# Patient Record
Sex: Female | Born: 1975 | Race: White | Hispanic: No | Marital: Married | Smoking: Never smoker
Health system: Southern US, Community
[De-identification: ages and names within clinical notes are randomized; demographics above are authoritative.]

## PROBLEM LIST (undated history)

## (undated) DIAGNOSIS — T7840XA Allergy, unspecified, initial encounter: Secondary | ICD-10-CM

## (undated) DIAGNOSIS — F32A Depression, unspecified: Secondary | ICD-10-CM

## (undated) DIAGNOSIS — M1990 Unspecified osteoarthritis, unspecified site: Secondary | ICD-10-CM

## (undated) DIAGNOSIS — F419 Anxiety disorder, unspecified: Secondary | ICD-10-CM

## (undated) DIAGNOSIS — F329 Major depressive disorder, single episode, unspecified: Secondary | ICD-10-CM

## (undated) DIAGNOSIS — K219 Gastro-esophageal reflux disease without esophagitis: Secondary | ICD-10-CM

## (undated) HISTORY — DX: Major depressive disorder, single episode, unspecified: F32.9

## (undated) HISTORY — DX: Depression, unspecified: F32.A

## (undated) HISTORY — DX: Allergy, unspecified, initial encounter: T78.40XA

## (undated) HISTORY — DX: Gastro-esophageal reflux disease without esophagitis: K21.9

---

## 1998-10-13 ENCOUNTER — Other Ambulatory Visit: Admission: RE | Admit: 1998-10-13 | Discharge: 1998-10-13 | Payer: Self-pay | Admitting: Family Medicine

## 2000-02-09 ENCOUNTER — Other Ambulatory Visit: Admission: RE | Admit: 2000-02-09 | Discharge: 2000-02-09 | Payer: Self-pay | Admitting: Family Medicine

## 2001-03-04 ENCOUNTER — Other Ambulatory Visit: Admission: RE | Admit: 2001-03-04 | Discharge: 2001-03-04 | Payer: Self-pay | Admitting: Family Medicine

## 2007-10-15 ENCOUNTER — Encounter: Admission: RE | Admit: 2007-10-15 | Discharge: 2007-10-15 | Payer: Self-pay | Admitting: Family Medicine

## 2008-10-15 ENCOUNTER — Encounter: Admission: RE | Admit: 2008-10-15 | Discharge: 2008-10-15 | Payer: Self-pay

## 2013-02-13 ENCOUNTER — Encounter: Payer: Self-pay | Admitting: *Deleted

## 2013-02-13 NOTE — Telephone Encounter (Signed)
This encounter was created in error - please disregard.

## 2013-03-16 ENCOUNTER — Ambulatory Visit (INDEPENDENT_AMBULATORY_CARE_PROVIDER_SITE_OTHER): Payer: BC Managed Care – PPO | Admitting: Nurse Practitioner

## 2013-03-16 ENCOUNTER — Encounter: Payer: Self-pay | Admitting: Nurse Practitioner

## 2013-03-16 VITALS — BP 125/73 | HR 104 | Temp 98.6°F | Ht 65.0 in | Wt 160.0 lb

## 2013-03-16 DIAGNOSIS — J309 Allergic rhinitis, unspecified: Secondary | ICD-10-CM | POA: Insufficient documentation

## 2013-03-16 DIAGNOSIS — K219 Gastro-esophageal reflux disease without esophagitis: Secondary | ICD-10-CM | POA: Insufficient documentation

## 2013-03-16 DIAGNOSIS — F329 Major depressive disorder, single episode, unspecified: Secondary | ICD-10-CM

## 2013-03-16 MED ORDER — BUSPIRONE HCL 15 MG PO TABS: 15.0000 mg | ORAL_TABLET | Freq: Every day | ORAL | Status: DC

## 2013-03-16 MED ORDER — ESCITALOPRAM OXALATE 20 MG PO TABS: 20.0000 mg | ORAL_TABLET | Freq: Every day | ORAL | Status: DC

## 2013-03-16 NOTE — Patient Instructions (Signed)
Stress Stress-related medical problems are becoming increasingly common. The body has a built-in physical response to stressful situations. Faced with pressure, challenge or danger, we need to react quickly. Our bodies release hormones such as cortisol and adrenaline to help do this. These hormones are part of the "fight or flight" response and affect the metabolic rate, heart rate and blood pressure, resulting in a heightened, stressed state that prepares the body for optimum performance in dealing with a stressful situation. It is likely that early man required these mechanisms to stay alive, but usually modern stresses do not call for this, and the same hormones released in today's world can damage health and reduce coping ability. CAUSES  Pressure to perform at work, at school or in sports.  Threats of physical violence.  Money worries.  Arguments.  Family conflicts.  Divorce or separation from significant other.  Bereavement.  New job or unemployment.  Changes in location.  Alcohol or drug abuse. SOMETIMES, THERE IS NO PARTICULAR REASON FOR DEVELOPING STRESS. Almost all people are at risk of being stressed at some time in their lives. It is important to know that some stress is temporary and some is long term.  Temporary stress will go away when a situation is resolved. Most people can cope with short periods of stress, and it can often be relieved by relaxing, taking a walk, chatting through issues with friends, or having a good night's sleep.  Chronic (long-term, continuous) stress is much harder to deal with. It can be psychologically and emotionally damaging. It can be harmful both for an individual and for friends and family. SYMPTOMS Everyone reacts to stress differently. There are some common effects that help us recognize it. In times of extreme stress, people may:  Shake uncontrollably.  Breathe faster and deeper than normal (hyperventilate).  Vomit.  For people  with asthma, stress can trigger an attack.  For some people, stress may trigger migraine headaches, ulcers, and body pain. PHYSICAL EFFECTS OF STRESS MAY INCLUDE:  Loss of energy.  Skin problems.  Aches and pains resulting from tense muscles, including neck ache, backache and tension headaches.  Increased pain from arthritis and other conditions.  Irregular heart beat (palpitations).  Periods of irritability or anger.  Apathy or depression.  Anxiety (feeling uptight or worrying).  Unusual behavior.  Loss of appetite.  Comfort eating.  Lack of concentration.  Loss of, or decreased, sex-drive.  Increased smoking, drinking, or recreational drug use.  For women, missed periods.  Ulcers, joint pain, and muscle pain. Post-traumatic stress is the stress caused by any serious accident, strong emotional damage, or extremely difficult or violent experience such as rape or war. Post-traumatic stress victims can experience mixtures of emotions such as fear, shame, depression, guilt or anger. It may include recurrent memories or images that may be haunting. These feelings can last for weeks, months or even years after the traumatic event that triggered them. Specialized treatment, possibly with medicines and psychological therapies, is available. If stress is causing physical symptoms, severe distress or making it difficult for you to function as normal, it is worth seeing your caregiver. It is important to remember that although stress is a usual part of life, extreme or prolonged stress can lead to other illnesses that will need treatment. It is better to visit a doctor sooner rather than later. Stress has been linked to the development of high blood pressure and heart disease, as well as insomnia and depression. There is no diagnostic test for   stress since everyone reacts to it differently. But a caregiver will be able to spot the physical symptoms, such  as:  Headaches.  Shingles.  Ulcers. Emotional distress such as intense worry, low mood or irritability should be detected when the doctor asks pertinent questions to identify any underlying problems that might be the cause. In case there are physical reasons for the symptoms, the doctor may also want to do some tests to exclude certain conditions. If you feel that you are suffering from stress, try to identify the aspects of your life that are causing it. Sometimes you may not be able to change or avoid them, but even a small change can have a positive ripple effect. A simple lifestyle change can make all the difference. STRATEGIES THAT CAN HELP DEAL WITH STRESS:  Delegating or sharing responsibilities.  Avoiding confrontations.  Learning to be more assertive.  Regular exercise.  Avoid using alcohol or street drugs to cope.  Eating a healthy, balanced diet, rich in fruit and vegetables and proteins.  Finding humor or absurdity in stressful situations.  Never taking on more than you know you can handle comfortably.  Organizing your time better to get as much done as possible.  Talking to friends or family and sharing your thoughts and fears.  Listening to music or relaxation tapes.  Tensing and then relaxing your muscles, starting at the toes and working up to the head and neck. If you think that you would benefit from help, either in identifying the things that are causing your stress or in learning techniques to help you relax, see a caregiver who is capable of helping you with this. Rather than relying on medications, it is usually better to try and identify the things in your life that are causing stress and try to deal with them. There are many techniques of managing stress including counseling, psychotherapy, aromatherapy, yoga, and exercise. Your caregiver can help you determine what is best for you. Document Released: 09/29/2002 Document Revised: 10/01/2011 Document  Reviewed: 08/26/2007 [employer] Patient Information 2014 [employer].  

## 2013-03-16 NOTE — Progress Notes (Signed)
  Subjective:    Patient ID: Alejandra Mcmahon, female    DOB: 08/24/1975, 37 y.o.   MRN: [id number]  HPI Patient in for follow up of depression- she is currently on buspar and lexapro- The combination is working well for her- helps to keep her calm and focused. Would like to remain on combination.    Review of Systems  All other systems reviewed and are negative.       Objective:   Physical Exam  Constitutional: She is oriented to person, place, and time. She appears well-developed and well-nourished.  Cardiovascular: Normal rate, regular rhythm and normal heart sounds.   Pulmonary/Chest: Effort normal and breath sounds normal.  Neurological: She is alert and oriented to person, place, and time. No cranial nerve deficit.  Psychiatric: She has a normal mood and affect. Her behavior is normal. Judgment and thought content normal.    BP 125/73  Pulse 104  Temp(Src) 98.6 F (37 C) (Oral)  Ht 5\' 5"  (1.651 m)  Wt 160 lb (72.576 kg)  BMI 26.63 kg/m2  LMP 03/12/2013       Assessment & Plan:  1. Depression Stress management Exercise encouraged - escitalopram (LEXAPRO) 20 MG tablet; Take 1 tablet (20 mg total) by mouth daily.  Dispense: 30 tablet; Refill: 5 - busPIRone (BUSPAR) 15 MG tablet; Take 1 tablet (15 mg total) by mouth daily.  Dispense: 30 tablet; Refill: 5  Mary-Margaret Daphine Deutscher, FNP

## 2013-06-09 ENCOUNTER — Other Ambulatory Visit: Payer: Self-pay | Admitting: *Deleted

## 2013-06-09 MED ORDER — DROSPIREN-ETH ESTRAD-LEVOMEFOL 3-0.03-0.451 MG PO TABS: 1.0000 | ORAL_TABLET | Freq: Every day | ORAL | Status: DC

## 2013-07-20 ENCOUNTER — Encounter: Payer: Self-pay | Admitting: Family Medicine

## 2013-07-20 ENCOUNTER — Ambulatory Visit (INDEPENDENT_AMBULATORY_CARE_PROVIDER_SITE_OTHER): Payer: BC Managed Care – PPO | Admitting: Family Medicine

## 2013-07-20 ENCOUNTER — Telehealth: Payer: Self-pay | Admitting: Nurse Practitioner

## 2013-07-20 ENCOUNTER — Encounter (INDEPENDENT_AMBULATORY_CARE_PROVIDER_SITE_OTHER): Payer: Self-pay

## 2013-07-20 VITALS — BP 111/74 | HR 109 | Temp 97.0°F | Ht 64.0 in | Wt 164.4 lb

## 2013-07-20 DIAGNOSIS — R059 Cough, unspecified: Secondary | ICD-10-CM

## 2013-07-20 DIAGNOSIS — J069 Acute upper respiratory infection, unspecified: Secondary | ICD-10-CM

## 2013-07-20 DIAGNOSIS — R05 Cough: Secondary | ICD-10-CM

## 2013-07-20 LAB — POCT INFLUENZA A/B
Influenza A, POC: NEGATIVE
Influenza B, POC: NEGATIVE

## 2013-07-20 MED ORDER — AZITHROMYCIN 250 MG PO TABS: ORAL_TABLET | ORAL | Status: DC

## 2013-07-20 MED ORDER — BENZONATATE 100 MG PO CAPS: 100.0000 mg | ORAL_CAPSULE | Freq: Three times a day (TID) | ORAL | Status: DC | PRN

## 2013-07-20 NOTE — Progress Notes (Signed)
   Subjective:    Patient ID: Shanigua Gibb, female    DOB: 1975/11/09, 37 y.o.   MRN: [id number]  HPI This 37 y.o. female presents for evaluation of URI sx's for over a week.   Review of Systems No chest pain, SOB, HA, dizziness, vision change, N/V, diarrhea, constipation, dysuria, urinary urgency or frequency, myalgias, arthralgias or rash.     Objective:   Physical Exam Vital signs noted  Well developed well nourished female.  HEENT - Head atraumatic Normocephalic                Eyes - PERRLA, Conjuctiva - clear Sclera- Clear EOMI                Ears - EAC's Wnl TM's Wnl Gross Hearing WNL                Nose - Nares patent                 Throat - oropharanx wnl Respiratory - Lungs CTA bilateral Cardiac - RRR S1 and S2 without murmur GI - Abdomen soft Nontender and bowel sounds active x 4 Extremities - No edema. Neuro - Grossly intact.       Assessment & Plan:  Cough - Plan: Influenza A/B, azithromycin (ZITHROMAX) 250 MG tablet, benzonatate (TESSALON PERLES) 100 MG capsule  URI (upper respiratory infection) - Plan: azithromycin (ZITHROMAX) 250 MG tablet, benzonatate (TESSALON PERLES) 100 MG capsule Push po fluids, rest, tylenol and motrin otc prn as directed for fever, arthralgias, and myalgias.  Follow up prn if sx's continue or persist.  Deatra Canter FNP

## 2013-07-20 NOTE — Telephone Encounter (Signed)
appt today at 1:00 with bill oxford

## 2013-08-06 ENCOUNTER — Other Ambulatory Visit: Payer: Self-pay | Admitting: Nurse Practitioner

## 2013-08-12 ENCOUNTER — Ambulatory Visit (INDEPENDENT_AMBULATORY_CARE_PROVIDER_SITE_OTHER): Payer: BC Managed Care – PPO | Admitting: General Practice

## 2013-08-12 ENCOUNTER — Encounter: Payer: Self-pay | Admitting: General Practice

## 2013-08-12 VITALS — BP 114/79 | HR 111 | Temp 97.3°F | Ht 64.0 in | Wt 165.0 lb

## 2013-08-12 DIAGNOSIS — H6120 Impacted cerumen, unspecified ear: Secondary | ICD-10-CM

## 2013-08-12 DIAGNOSIS — J329 Chronic sinusitis, unspecified: Secondary | ICD-10-CM

## 2013-08-12 DIAGNOSIS — H6123 Impacted cerumen, bilateral: Secondary | ICD-10-CM

## 2013-08-12 DIAGNOSIS — J029 Acute pharyngitis, unspecified: Secondary | ICD-10-CM

## 2013-08-12 LAB — POCT INFLUENZA A/B
INFLUENZA B, POC: NEGATIVE
Influenza A, POC: NEGATIVE

## 2013-08-12 LAB — POCT RAPID STREP A (OFFICE): RAPID STREP A SCREEN: NEGATIVE

## 2013-08-12 MED ORDER — CLARITHROMYCIN 250 MG PO TABS: 250.0000 mg | ORAL_TABLET | Freq: Two times a day (BID) | ORAL | Status: DC

## 2013-08-12 MED ORDER — AZITHROMYCIN 250 MG PO TABS: ORAL_TABLET | ORAL | Status: DC

## 2013-08-12 NOTE — Progress Notes (Signed)
Subjective:    Patient ID: Alejandra Mcmahon, female    DOB: 1976/05/20, 38 y.o.   MRN: [id number]  Cough This is a new problem. The current episode started in the past 7 days. The problem has been gradually worsening. The cough is productive of sputum. Associated symptoms include headaches, myalgias, nasal congestion, postnasal drip and a sore throat. Pertinent negatives include no chest pain, chills, fever, shortness of breath or wheezing. The symptoms are aggravated by lying down. Treatments tried: mucinex. Her past medical history is significant for bronchitis. There is no history of asthma or pneumonia.  Fever  Associated symptoms include congestion, headaches and a sore throat. Pertinent negatives include no chest pain, diarrhea, vomiting or wheezing.  Sore Throat  This is a new problem. The current episode started in the past 7 days. The problem has been unchanged. Neither side of throat is experiencing more pain than the other. Maximum temperature: 99.6 temp. The pain is at a severity of 3/10. Associated symptoms include congestion and headaches. Pertinent negatives include no diarrhea, shortness of breath or vomiting. She has had no exposure to strep or mono. She has tried acetaminophen for the symptoms. The treatment provided moderate relief.      Review of Systems  Constitutional: Negative for fever and chills.  HENT: Positive for congestion, postnasal drip and sore throat.   Respiratory: Negative for chest tightness, shortness of breath and wheezing.   Cardiovascular: Negative for chest pain and palpitations.  Gastrointestinal: Negative for vomiting and diarrhea.  Musculoskeletal: Positive for myalgias.  Neurological: Positive for headaches.  All other systems reviewed and are negative.       Objective:   Physical Exam  Constitutional: She is oriented to person, place, and time. She appears well-developed and well-nourished.  HENT:  Head: Normocephalic and atraumatic.    Mouth/Throat: Oropharynx is clear and moist.  Cerumen impaction to bilateral ears, unable to visualize at this time  Eyes: EOM are normal. Pupils are equal, round, and reactive to light.  Neck: Normal range of motion. Neck supple. No thyromegaly present.  Cardiovascular: Normal rate, regular rhythm and normal heart sounds.   Pulmonary/Chest: Effort normal and breath sounds normal. No respiratory distress. She exhibits no tenderness.  Neurological: She is alert and oriented to person, place, and time.  Skin: Skin is dry.  Psychiatric: She has a normal mood and affect.     Results for orders placed in visit on 08/12/13  POCT INFLUENZA A/B      Result Value Range   Influenza A, POC Negative     Influenza B, POC Negative    POCT RAPID STREP A (OFFICE)      Result Value Range   Rapid Strep A Screen Negative  Negative        Assessment & Plan:  1. Sore throat  - POCT Influenza A/B - POCT rapid strep A  2. Impacted cerumen of both ears -cerumen partially removed with cerumen spoon -bilateral ear lavage to remove remainder  -TM pearly gray bilaterally after lavage, negative erythema 3. Sinusitis  - clarithromycin (BIAXIN) 250 MG tablet; Take 1 tablet (250 mg total) by mouth 2 (two) times daily.  Dispense: 20 tablet; Refill: 0 -adequate fluids -Rest -RTO if symptoms worsen or unresolved -Patient verbalized understanding Coralie Keens, FNP-C

## 2013-08-12 NOTE — Patient Instructions (Signed)

## 2013-08-14 ENCOUNTER — Other Ambulatory Visit: Payer: Self-pay | Admitting: Nurse Practitioner

## 2013-09-03 ENCOUNTER — Other Ambulatory Visit: Payer: Self-pay | Admitting: Nurse Practitioner

## 2013-09-04 NOTE — Telephone Encounter (Signed)
Patient notified at last refill on 08-06-13 that NTBS. Was seen on 08-12-13 but only for an acute visit. Please advise on refills

## 2013-09-09 ENCOUNTER — Other Ambulatory Visit: Payer: Self-pay | Admitting: Nurse Practitioner

## 2013-09-27 ENCOUNTER — Other Ambulatory Visit: Payer: Self-pay | Admitting: Family Medicine

## 2013-09-28 NOTE — Telephone Encounter (Signed)
Last seen 08/12/13  Mae

## 2013-09-30 ENCOUNTER — Other Ambulatory Visit: Payer: Self-pay | Admitting: Nurse Practitioner

## 2013-10-02 ENCOUNTER — Other Ambulatory Visit: Payer: Self-pay | Admitting: Nurse Practitioner

## 2013-10-09 ENCOUNTER — Other Ambulatory Visit: Payer: Self-pay | Admitting: *Deleted

## 2013-10-09 MED ORDER — BUSPIRONE HCL 15 MG PO TABS: ORAL_TABLET | ORAL | Status: DC

## 2013-11-09 ENCOUNTER — Other Ambulatory Visit: Payer: BC Managed Care – PPO | Admitting: Nurse Practitioner

## 2013-11-10 ENCOUNTER — Telehealth: Payer: Self-pay | Admitting: Nurse Practitioner

## 2013-11-10 MED ORDER — ESCITALOPRAM OXALATE 20 MG PO TABS: ORAL_TABLET | ORAL | Status: DC

## 2013-11-10 MED ORDER — BUSPIRONE HCL 15 MG PO TABS: ORAL_TABLET | ORAL | Status: DC

## 2013-11-10 NOTE — Telephone Encounter (Signed)
rx sent to pharmacy

## 2013-11-17 ENCOUNTER — Other Ambulatory Visit: Payer: Self-pay | Admitting: Nurse Practitioner

## 2013-11-18 NOTE — Telephone Encounter (Signed)
Last ov 1/15. No pap in epic. ntbs

## 2013-11-19 ENCOUNTER — Encounter: Payer: Self-pay | Admitting: *Deleted

## 2013-12-10 ENCOUNTER — Other Ambulatory Visit: Payer: Self-pay | Admitting: Nurse Practitioner

## 2013-12-11 NOTE — Telephone Encounter (Signed)
Last seen 08/12/13 Mae  No PAP in [facility]

## 2013-12-16 ENCOUNTER — Other Ambulatory Visit: Payer: Self-pay | Admitting: General Practice

## 2013-12-17 ENCOUNTER — Ambulatory Visit (INDEPENDENT_AMBULATORY_CARE_PROVIDER_SITE_OTHER): Payer: BC Managed Care – PPO | Admitting: Nurse Practitioner

## 2013-12-17 ENCOUNTER — Encounter: Payer: Self-pay | Admitting: Nurse Practitioner

## 2013-12-17 VITALS — BP 132/79 | HR 65 | Temp 98.3°F | Ht 64.0 in | Wt 165.0 lb

## 2013-12-17 DIAGNOSIS — Z01419 Encounter for gynecological examination (general) (routine) without abnormal findings: Secondary | ICD-10-CM

## 2013-12-17 DIAGNOSIS — F32A Depression, unspecified: Secondary | ICD-10-CM

## 2013-12-17 DIAGNOSIS — Z0000 Encounter for general adult medical examination without abnormal findings: Secondary | ICD-10-CM

## 2013-12-17 DIAGNOSIS — K219 Gastro-esophageal reflux disease without esophagitis: Secondary | ICD-10-CM

## 2013-12-17 DIAGNOSIS — Z124 Encounter for screening for malignant neoplasm of cervix: Secondary | ICD-10-CM

## 2013-12-17 DIAGNOSIS — F329 Major depressive disorder, single episode, unspecified: Secondary | ICD-10-CM

## 2013-12-17 LAB — POCT URINALYSIS DIPSTICK
BILIRUBIN UA: NEGATIVE
GLUCOSE UA: NEGATIVE
Ketones, UA: NEGATIVE
Leukocytes, UA: NEGATIVE
NITRITE UA: NEGATIVE
Protein, UA: NEGATIVE
RBC UA: NEGATIVE
Spec Grav, UA: <=1.005
UROBILINOGEN UA: NEGATIVE
pH, UA: 8

## 2013-12-17 LAB — POCT CBC
Granulocyte percent: 67 %G (ref 37–80)
HCT, POC: 41.3 % (ref 37.7–47.9)
Hemoglobin: 13.9 g/dL (ref 12.2–16.2)
LYMPH, POC: 2 (ref 0.6–3.4)
MCH: 29.8 pg (ref 27–31.2)
MCHC: 33.7 g/dL (ref 31.8–35.4)
MCV: 88.5 fL (ref 80–97)
MPV: 8.6 fL (ref 0–99.8)
PLATELET COUNT, POC: 223 10*3/uL (ref 142–424)
POC Granulocyte: 4.4 (ref 2–6.9)
POC LYMPH %: 30.7 % (ref 10–50)
RBC: 4.7 M/uL (ref 4.04–5.48)
RDW, POC: 11.6 %
WBC: 6.6 10*3/uL (ref 4.6–10.2)

## 2013-12-17 NOTE — Progress Notes (Signed)
Subjective:    Patient ID: Alejandra Mcmahon, female    DOB: 06-04-1976, 38 y.o.   MRN: [id number]  HPI Patient in today for CPE and PAP- SHe is doing well today without complaints.  Patient Active Problem List   Diagnosis Date Noted  . GERD (gastroesophageal reflux disease) 03/16/2013  . Depression 03/16/2013  . Allergic rhinitis 03/16/2013   Outpatient Encounter Prescriptions as of 12/17/2013  Medication Sig  . busPIRone (BUSPAR) 15 MG tablet TAKE 1 TABLET BY MOUTH DAILY  . escitalopram (LEXAPRO) 20 MG tablet TAKE 1 TABLET BY MOUTH DAILY  . SAFYRAL 3-0.03-0.451 MG tablet TAKE 1 TABLET BY MOUTH EVERY DAY. NEED TO BE SEEN  . cetirizine (ZYRTEC) 10 MG tablet Take 10 mg by mouth daily.  . fluticasone (FLONASE) 50 MCG/ACT nasal spray Place 2 sprays into the nose daily.  . [DISCONTINUED] clarithromycin (BIAXIN) 250 MG tablet Take 1 tablet (250 mg total) by mouth 2 (two) times daily.       Review of Systems  Constitutional: Negative.   Respiratory: Negative.   Cardiovascular: Negative.   Neurological: Positive for headaches (slight ).  Hematological: Negative.   Psychiatric/Behavioral: Negative.   All other systems reviewed and are negative.      Objective:   Physical Exam  Constitutional: She is oriented to person, place, and time. She appears well-developed and well-nourished.  HENT:  Head: Normocephalic.  Right Ear: Hearing, tympanic membrane, external ear and ear canal normal.  Left Ear: Hearing, tympanic membrane, external ear and ear canal normal.  Nose: Nose normal.  Mouth/Throat: Uvula is midline and oropharynx is clear and moist.  Eyes: Conjunctivae and EOM are normal. Pupils are equal, round, and reactive to light.  Neck: Normal range of motion and full passive range of motion without pain. Neck supple. No JVD present. Carotid bruit is not present. No mass and no thyromegaly present.  Cardiovascular: Normal rate, normal heart sounds and intact distal pulses.   No  murmur heard. Pulmonary/Chest: Effort normal and breath sounds normal. Right breast exhibits no inverted nipple, no mass, no nipple discharge, no skin change and no tenderness. Left breast exhibits no inverted nipple, no mass, no nipple discharge, no skin change and no tenderness.  Abdominal: Soft. Bowel sounds are normal. She exhibits no mass. There is no tenderness.  Genitourinary: Vagina normal and uterus normal. No breast swelling, tenderness, discharge or bleeding.  bimanual exam-No adnexal masses or tenderness. Cervix nonparous and pink- no discharge  Musculoskeletal: Normal range of motion.  Lymphadenopathy:    She has no cervical adenopathy.  Neurological: She is alert and oriented to person, place, and time.  Skin: Skin is warm and dry.  Psychiatric: She has a normal mood and affect. Her behavior is normal. Judgment and thought content normal.    BP 132/79  Pulse 65  Temp(Src) 98.3 F (36.8 C) (Oral)  Ht '5\' 4"'  (1.626 m)  Wt 165 lb (74.844 kg)  BMI 28.31 kg/m2  Results for orders placed in visit on 12/17/13  POCT URINALYSIS DIPSTICK      Result Value Ref Range   Color, UA yellow     Clarity, UA clear     Glucose, UA neg     Bilirubin, UA neg     Ketones, UA neg     Spec Grav, UA <=1.005     Blood, UA neg     pH, UA 8.0     Protein, UA neg     Urobilinogen, UA negative  Nitrite, UA neg     Leukocytes, UA Negative          Assessment & Plan:   1. Annual physical exam   2. GERD (gastroesophageal reflux disease)   3. Depression   4. Encounter for routine gynecological examination    Orders Placed This Encounter  Procedures  . CMP14+EGFR  . NMR, lipoprofile  . Thyroid Panel With TSH  . Vit D  25 hydroxy (rtn osteoporosis monitoring)  . POCT urinalysis dipstick  . POCT CBC    Labs pending Health maintenance reviewed Diet and exercise encouraged Continue all meds Follow up  In 1 year and prn   Mary-Margaret Hassell Done, FNP

## 2013-12-17 NOTE — Patient Instructions (Signed)

## 2013-12-18 ENCOUNTER — Other Ambulatory Visit: Payer: Self-pay | Admitting: Nurse Practitioner

## 2013-12-18 ENCOUNTER — Other Ambulatory Visit: Payer: BC Managed Care – PPO | Admitting: Nurse Practitioner

## 2013-12-18 LAB — CMP14+EGFR
ALK PHOS: 51 IU/L (ref 39–117)
ALT: 18 IU/L (ref 0–32)
AST: 19 IU/L (ref 0–40)
Albumin/Globulin Ratio: 1.8 (ref 1.1–2.5)
Albumin: 3.9 g/dL (ref 3.5–5.5)
BUN / CREAT RATIO: 14 (ref 8–20)
BUN: 10 mg/dL (ref 6–20)
CALCIUM: 9.4 mg/dL (ref 8.7–10.2)
CHLORIDE: 101 mmol/L (ref 97–108)
CO2: 23 mmol/L (ref 18–29)
Creatinine, Ser: 0.72 mg/dL (ref 0.57–1.00)
GFR calc Af Amer: 124 mL/min/{1.73_m2} (ref >59)
GFR calc non Af Amer: 107 mL/min/{1.73_m2} (ref >59)
GLOBULIN, TOTAL: 2.2 g/dL (ref 1.5–4.5)
Glucose: 88 mg/dL (ref 65–99)
POTASSIUM: 4.3 mmol/L (ref 3.5–5.2)
SODIUM: 137 mmol/L (ref 134–144)
Total Bilirubin: <0.2 mg/dL (ref 0.0–1.2)
Total Protein: 6.1 g/dL (ref 6.0–8.5)

## 2013-12-18 LAB — THYROID PANEL WITH TSH
FREE THYROXINE INDEX: 2.3 (ref 1.2–4.9)
T3 Uptake Ratio: 23 % — ABNORMAL LOW (ref 24–39)
T4 TOTAL: 10.1 ug/dL (ref 4.5–12.0)
TSH: 1.59 u[IU]/mL (ref 0.450–4.500)

## 2013-12-18 LAB — NMR, LIPOPROFILE
Cholesterol: 236 mg/dL — ABNORMAL HIGH (ref 100–199)
HDL Cholesterol by NMR: 70 mg/dL (ref >39)
HDL PARTICLE NUMBER: 51.6 umol/L (ref >=30.5)
LDL Particle Number: 1771 nmol/L — ABNORMAL HIGH (ref <1000)
LDL Size: 20.7 nm (ref >20.5)
LDLC SERPL CALC-MCNC: 125 mg/dL — AB (ref 0–99)
LP-IR SCORE: 39 (ref <=45)
Small LDL Particle Number: 658 nmol/L — ABNORMAL HIGH (ref <=527)
Triglycerides by NMR: 204 mg/dL — ABNORMAL HIGH (ref 0–149)

## 2013-12-18 LAB — VITAMIN D 25 HYDROXY (VIT D DEFICIENCY, FRACTURES): Vit D, 25-Hydroxy: 21.6 ng/mL — ABNORMAL LOW (ref 30.0–100.0)

## 2013-12-18 MED ORDER — ATORVASTATIN CALCIUM 40 MG PO TABS: 40.0000 mg | ORAL_TABLET | Freq: Every day | ORAL | Status: DC

## 2013-12-22 LAB — PAP IG W/ RFLX HPV ASCU: PAP Smear Comment: 0

## 2014-01-05 ENCOUNTER — Other Ambulatory Visit: Payer: Self-pay | Admitting: Nurse Practitioner

## 2014-01-12 ENCOUNTER — Encounter: Payer: Self-pay | Admitting: Family

## 2014-01-12 ENCOUNTER — Ambulatory Visit (INDEPENDENT_AMBULATORY_CARE_PROVIDER_SITE_OTHER): Payer: BC Managed Care – PPO | Admitting: Family

## 2014-01-12 VITALS — BP 118/77 | HR 95 | Temp 99.6°F | Ht 64.0 in | Wt 161.0 lb

## 2014-01-12 DIAGNOSIS — L259 Unspecified contact dermatitis, unspecified cause: Secondary | ICD-10-CM

## 2014-01-12 MED ORDER — HYDROCORTISONE 2.5 % EX CREA: TOPICAL_CREAM | Freq: Two times a day (BID) | CUTANEOUS | Status: DC

## 2014-01-12 MED ORDER — TRIAMCINOLONE ACETONIDE 0.5 % EX OINT: 1.0000 "application " | TOPICAL_OINTMENT | Freq: Two times a day (BID) | CUTANEOUS | Status: DC

## 2014-01-12 MED ORDER — METHYLPREDNISOLONE (PAK) 4 MG PO TABS: ORAL_TABLET | ORAL | Status: DC

## 2014-01-12 NOTE — Progress Notes (Signed)
   Subjective:    Patient ID: Fenton FoyHeather Hazell, female    DOB: 05-18-1976, 38 y.o.   MRN: [id number]  Rash This is a new problem. The current episode started 1 to 4 weeks ago (Two weeks ago). The problem has been gradually worsening since onset. The affected locations include the abdomen, right hip and right axilla. The rash is characterized by dryness, itchiness, redness and pain. She was exposed to nothing. Pertinent negatives include no cough, diarrhea, fever, joint pain or shortness of breath. Past treatments include antibiotic cream, antihistamine and anti-itch cream. The treatment provided mild relief. There is no history of allergies or asthma.      Review of Systems  Constitutional: Negative for fever.  Respiratory: Negative.  Negative for cough and shortness of breath.   Cardiovascular: Negative.   Gastrointestinal: Negative for diarrhea.  Genitourinary: Negative.   Musculoskeletal: Negative.  Negative for joint pain.  Skin: Positive for rash.  Hematological: Negative.   Psychiatric/Behavioral: Negative.   All other systems reviewed and are negative.      Objective:   Physical Exam  Vitals reviewed. Constitutional: She is oriented to person, place, and time. She appears well-developed and well-nourished. No distress.  Cardiovascular: Normal rate, regular rhythm, normal heart sounds and intact distal pulses.   No murmur heard. Pulmonary/Chest: Effort normal and breath sounds normal. No respiratory distress. She has no wheezes.  Abdominal: Soft. Bowel sounds are normal. She exhibits no distension. There is no tenderness.  Musculoskeletal: Normal range of motion. She exhibits no edema and no tenderness.  Neurological: She is alert and oriented to person, place, and time. She has normal reflexes. No cranial nerve deficit.  Skin: Skin is warm and dry. Rash noted. There is erythema.  Generalized Erythemas papular rash on lower abdomen area, right hip, and under right axillary    Psychiatric: She has a normal mood and affect. Her behavior is normal. Judgment and thought content normal.   BP 118/77  Pulse 95  Temp(Src) 99.6 F (37.6 C) (Oral)  Ht 5\' 4"  (1.626 m)  Wt 161 lb (73.029 kg)  BMI 27.62 kg/m2        Assessment & Plan:  1. Contact dermatitis -Do not scratch or pick at rash -Keep clean and dry - methylPREDNIsolone (MEDROL DOSPACK) 4 MG tablet; follow package directions  Dispense: 21 tablet; Refill: 0 - hydrocortisone 2.5 % cream; Apply topically 2 (two) times daily.  Dispense: 30 g; Refill: 0 - triamcinolone ointment (KENALOG) 0.5 %; Apply 1 application topically 2 (two) times daily.  Dispense: 30 g; Refill: 0 -RTO prn  Jannifer Rodneyhristy Hawks, FNP

## 2014-01-12 NOTE — Patient Instructions (Signed)

## 2014-01-20 ENCOUNTER — Other Ambulatory Visit: Payer: Self-pay | Admitting: Nurse Practitioner

## 2014-02-04 ENCOUNTER — Ambulatory Visit (INDEPENDENT_AMBULATORY_CARE_PROVIDER_SITE_OTHER): Payer: BC Managed Care – PPO | Admitting: Family Medicine

## 2014-02-04 VITALS — BP 109/73 | HR 92 | Temp 98.2°F | Wt 163.8 lb

## 2014-02-04 DIAGNOSIS — L259 Unspecified contact dermatitis, unspecified cause: Secondary | ICD-10-CM

## 2014-02-04 DIAGNOSIS — L309 Dermatitis, unspecified: Secondary | ICD-10-CM

## 2014-02-04 MED ORDER — HYDROXYZINE HCL 25 MG PO TABS: 25.0000 mg | ORAL_TABLET | Freq: Three times a day (TID) | ORAL | Status: DC | PRN

## 2014-02-04 MED ORDER — PREDNISONE 10 MG PO TABS: ORAL_TABLET | ORAL | Status: DC

## 2014-02-04 NOTE — Progress Notes (Signed)
   Subjective:    Patient ID: Alejandra FoyHeather Mcmahon, female    DOB: 1975-11-19, 38 y.o.   MRN: [id number]  HPI  This 38 y.o. female presents for evaluation of rash on lower abdomen.  She has had this rash for last few weeks.  She states it was almost gone but returned.  She was tx initially with cream and medrol dose pack.  Review of Systems C/o rash No chest pain, SOB, HA, dizziness, vision change, N/V, diarrhea, constipation, dysuria, urinary urgency or frequency, myalgias, arthralgias or rash.     Objective:   Physical Exam   Skin - Erythematous raised rash on sides and lower abdomen.  New areas of rash and various degrees of healing rash on abdomen    Assessment & Plan:  Dermatitis - Plan: hydrOXYzine (ATARAX/VISTARIL) 25 MG tablet, predniSONE (DELTASONE) 10 MG tablet  Deatra CanterWilliam J Oxford FNP

## 2014-02-15 ENCOUNTER — Other Ambulatory Visit: Payer: Self-pay | Admitting: Family Medicine

## 2014-02-15 ENCOUNTER — Telehealth: Payer: Self-pay | Admitting: Family Medicine

## 2014-02-15 DIAGNOSIS — R21 Rash and other nonspecific skin eruption: Secondary | ICD-10-CM

## 2014-02-15 NOTE — Telephone Encounter (Signed)
Referral to derm ordered.

## 2014-02-23 ENCOUNTER — Other Ambulatory Visit: Payer: Self-pay | Admitting: Family Medicine

## 2014-02-24 NOTE — Telephone Encounter (Signed)
Was given #30 on 02-04-14. Please advise on refill

## 2014-03-14 ENCOUNTER — Other Ambulatory Visit: Payer: Self-pay | Admitting: Family Medicine

## 2014-04-07 ENCOUNTER — Other Ambulatory Visit: Payer: Self-pay | Admitting: Family Medicine

## 2014-04-15 ENCOUNTER — Encounter: Payer: Self-pay | Admitting: Family Medicine

## 2014-04-15 ENCOUNTER — Ambulatory Visit (INDEPENDENT_AMBULATORY_CARE_PROVIDER_SITE_OTHER): Payer: BC Managed Care – PPO | Admitting: Family Medicine

## 2014-04-15 VITALS — BP 121/77 | HR 92 | Temp 97.8°F | Ht 64.0 in | Wt 175.8 lb

## 2014-04-15 DIAGNOSIS — J208 Acute bronchitis due to other specified organisms: Secondary | ICD-10-CM

## 2014-04-15 DIAGNOSIS — J209 Acute bronchitis, unspecified: Secondary | ICD-10-CM

## 2014-04-15 MED ORDER — AZITHROMYCIN 250 MG PO TABS: ORAL_TABLET | ORAL | Status: DC

## 2014-04-15 MED ORDER — METHYLPREDNISOLONE ACETATE 80 MG/ML IJ SUSP
80.0000 mg | Freq: Once | INTRAMUSCULAR | Status: AC
  Administered 2014-04-15: 80 mg via INTRAMUSCULAR

## 2014-04-15 MED ORDER — BENZONATATE 100 MG PO CAPS: 100.0000 mg | ORAL_CAPSULE | Freq: Three times a day (TID) | ORAL | Status: DC | PRN

## 2014-04-15 NOTE — Progress Notes (Signed)
   Subjective:    Patient ID: Alejandra Mcmahon, female    DOB: 07-17-1976, 38 y.o.   MRN: [id number]  HPI This 38 y.o. female presents for evaluation of URI sx's and sore throat and cough.   Review of Systems    No chest pain, SOB, HA, dizziness, vision change, N/V, diarrhea, constipation, dysuria, urinary urgency or frequency, myalgias, arthralgias or rash.  Objective:   Physical Exam  Vital signs noted  Well developed well nourished female.  HEENT - Head atraumatic Normocephalic                Eyes - PERRLA, Conjuctiva - clear Sclera- Clear EOMI                Ears - EAC's Wnl TM's Wnl Gross Hearing WNL                Nose - Nares patent                 Throat - oropharanx wnl Respiratory - Lungs CTA bilateral Cardiac - RRR S1 and S2 without murmur GI - Abdomen soft Nontender and bowel sounds active x 4 Extremities - No edema. Neuro - Grossly intact.      Assessment & Plan:  Acute bronchitis due to other specified organisms - Plan: methylPREDNISolone acetate (DEPO-MEDROL) injection 80 mg, benzonatate (TESSALON PERLES) 100 MG capsule, azithromycin (ZITHROMAX) 250 MG tablet  Deatra Canter FNP

## 2014-05-05 ENCOUNTER — Other Ambulatory Visit: Payer: Self-pay | Admitting: Nurse Practitioner

## 2014-05-26 ENCOUNTER — Other Ambulatory Visit: Payer: Self-pay | Admitting: Family Medicine

## 2014-06-20 ENCOUNTER — Other Ambulatory Visit: Payer: Self-pay | Admitting: Nurse Practitioner

## 2014-06-25 ENCOUNTER — Other Ambulatory Visit: Payer: Self-pay | Admitting: Nurse Practitioner

## 2014-07-08 ENCOUNTER — Other Ambulatory Visit: Payer: Self-pay | Admitting: Nurse Practitioner

## 2014-07-09 ENCOUNTER — Other Ambulatory Visit: Payer: Self-pay | Admitting: Nurse Practitioner

## 2014-07-09 MED ORDER — ESCITALOPRAM OXALATE 20 MG PO TABS: 20.0000 mg | ORAL_TABLET | Freq: Every day | ORAL | Status: DC

## 2014-07-15 ENCOUNTER — Other Ambulatory Visit: Payer: Self-pay | Admitting: Nurse Practitioner

## 2014-07-19 ENCOUNTER — Other Ambulatory Visit: Payer: Self-pay | Admitting: *Deleted

## 2014-07-19 MED ORDER — ESCITALOPRAM OXALATE 20 MG PO TABS: 20.0000 mg | ORAL_TABLET | Freq: Every day | ORAL | Status: DC

## 2014-07-19 NOTE — Telephone Encounter (Signed)
Lexapro script sent to [facility].  [facility] [place] [facility] [place].

## 2014-07-21 ENCOUNTER — Other Ambulatory Visit: Payer: Self-pay | Admitting: *Deleted

## 2014-07-21 MED ORDER — BUSPIRONE HCL 15 MG PO TABS: 15.0000 mg | ORAL_TABLET | Freq: Every day | ORAL | Status: DC

## 2014-08-04 ENCOUNTER — Other Ambulatory Visit: Payer: Self-pay | Admitting: Nurse Practitioner

## 2014-08-19 ENCOUNTER — Other Ambulatory Visit: Payer: Self-pay | Admitting: Nurse Practitioner

## 2014-09-09 ENCOUNTER — Ambulatory Visit: Payer: Self-pay | Admitting: Family

## 2014-09-16 ENCOUNTER — Other Ambulatory Visit: Payer: Self-pay | Admitting: Nurse Practitioner

## 2014-10-15 ENCOUNTER — Other Ambulatory Visit: Payer: Self-pay | Admitting: Nurse Practitioner

## 2014-12-12 ENCOUNTER — Other Ambulatory Visit: Payer: Self-pay | Admitting: Nurse Practitioner

## 2014-12-21 ENCOUNTER — Other Ambulatory Visit: Payer: Self-pay | Admitting: Nurse Practitioner

## 2014-12-21 NOTE — Telephone Encounter (Signed)
Last seen 04/15/14 B Oxford  No upcoming appt.

## 2015-01-12 ENCOUNTER — Other Ambulatory Visit: Payer: Self-pay | Admitting: Nurse Practitioner

## 2015-01-12 NOTE — Telephone Encounter (Signed)
04/15/14 B Oxford  This med not on EPIC list

## 2015-01-23 ENCOUNTER — Other Ambulatory Visit: Payer: Self-pay | Admitting: Family

## 2015-01-23 ENCOUNTER — Other Ambulatory Visit: Payer: Self-pay | Admitting: General Practice

## 2015-01-25 NOTE — Telephone Encounter (Signed)
Last seen 04/20/14 B Oxford

## 2015-01-26 ENCOUNTER — Other Ambulatory Visit: Payer: Self-pay | Admitting: Nurse Practitioner

## 2015-02-04 ENCOUNTER — Other Ambulatory Visit: Payer: Self-pay | Admitting: Nurse Practitioner

## 2015-03-24 ENCOUNTER — Other Ambulatory Visit: Payer: Self-pay | Admitting: Nurse Practitioner

## 2015-03-24 ENCOUNTER — Other Ambulatory Visit: Payer: Self-pay | Admitting: Family

## 2015-03-24 NOTE — Telephone Encounter (Signed)
no more refills without being seen  

## 2015-03-24 NOTE — Telephone Encounter (Signed)
Last seen 04/15/14  B OXford

## 2015-04-19 ENCOUNTER — Other Ambulatory Visit: Payer: Self-pay | Admitting: Nurse Practitioner

## 2015-04-23 ENCOUNTER — Other Ambulatory Visit: Payer: Self-pay | Admitting: Nurse Practitioner

## 2015-05-02 ENCOUNTER — Other Ambulatory Visit: Payer: Self-pay | Admitting: Nurse Practitioner

## 2015-05-09 ENCOUNTER — Ambulatory Visit (INDEPENDENT_AMBULATORY_CARE_PROVIDER_SITE_OTHER): Payer: BC Managed Care – PPO | Admitting: Family

## 2015-05-09 ENCOUNTER — Encounter: Payer: Self-pay | Admitting: Family

## 2015-05-09 VITALS — BP 126/88 | HR 118 | Temp 97.9°F | Ht 64.0 in | Wt 169.8 lb

## 2015-05-09 DIAGNOSIS — F411 Generalized anxiety disorder: Secondary | ICD-10-CM | POA: Insufficient documentation

## 2015-05-09 DIAGNOSIS — F329 Major depressive disorder, single episode, unspecified: Secondary | ICD-10-CM

## 2015-05-09 DIAGNOSIS — Z3041 Encounter for surveillance of contraceptive pills: Secondary | ICD-10-CM

## 2015-05-09 DIAGNOSIS — K219 Gastro-esophageal reflux disease without esophagitis: Secondary | ICD-10-CM

## 2015-05-09 DIAGNOSIS — J309 Allergic rhinitis, unspecified: Secondary | ICD-10-CM | POA: Diagnosis not present

## 2015-05-09 DIAGNOSIS — F32A Depression, unspecified: Secondary | ICD-10-CM

## 2015-05-09 MED ORDER — ESCITALOPRAM OXALATE 20 MG PO TABS: 20.0000 mg | ORAL_TABLET | Freq: Every day | ORAL | Status: DC

## 2015-05-09 MED ORDER — BUSPIRONE HCL 15 MG PO TABS: 15.0000 mg | ORAL_TABLET | Freq: Every day | ORAL | Status: DC

## 2015-05-09 MED ORDER — DROSPIREN-ETH ESTRAD-LEVOMEFOL 3-0.03-0.451 MG PO TABS: 1.0000 | ORAL_TABLET | Freq: Every day | ORAL | Status: DC

## 2015-05-09 NOTE — Patient Instructions (Signed)
Generalized Anxiety Disorder Generalized anxiety disorder (GAD) is a mental disorder. It interferes with life functions, including relationships, work, and school. GAD is different from normal anxiety, which everyone experiences at some point in their lives in response to specific life events and activities. Normal anxiety actually helps us prepare for and get through these life events and activities. Normal anxiety goes away after the event or activity is over.  GAD causes anxiety that is not necessarily related to specific events or activities. It also causes excess anxiety in proportion to specific events or activities. The anxiety associated with GAD is also difficult to control. GAD can vary from mild to severe. People with severe GAD can have intense waves of anxiety with physical symptoms (panic attacks).  SYMPTOMS The anxiety and worry associated with GAD are difficult to control. This anxiety and worry are related to many life events and activities and also occur more days than not for 6 months or longer. People with GAD also have three or more of the following symptoms (one or more in children):  Restlessness.   Fatigue.  Difficulty concentrating.   Irritability.  Muscle tension.  Difficulty sleeping or unsatisfying sleep. DIAGNOSIS GAD is diagnosed through an assessment by your health care provider. Your health care provider will ask you questions aboutyour mood,physical symptoms, and events in your life. Your health care provider may ask you about your medical history and use of alcohol or drugs, including prescription medicines. Your health care provider may also do a physical exam and blood tests. Certain medical conditions and the use of certain substances can cause symptoms similar to those associated with GAD. Your health care provider may refer you to a mental health specialist for further evaluation. TREATMENT The following therapies are usually used to treat GAD:    Medication. Antidepressant medication usually is prescribed for long-term daily control. Antianxiety medicines may be added in severe cases, especially when panic attacks occur.   Talk therapy (psychotherapy). Certain types of talk therapy can be helpful in treating GAD by providing support, education, and guidance. A form of talk therapy called cognitive behavioral therapy can teach you healthy ways to think about and react to daily life events and activities.  Stress managementtechniques. These include yoga, meditation, and exercise and can be very helpful when they are practiced regularly. A mental health specialist can help determine which treatment is best for you. Some people see improvement with one therapy. However, other people require a combination of therapies.   This information is not intended to replace advice given to you by your health care provider. Make sure you discuss any questions you have with your health care provider.   Document Released: 11/03/2012 Document Revised: 07/30/2014 Document Reviewed: 11/03/2012 Elsevier Interactive Patient Education 2016 [employer] Health Maintenance, Female Adopting a healthy lifestyle and getting preventive care can go a long way to promote health and wellness. Talk with your health care provider about what schedule of regular examinations is right for you. This is a good chance for you to check in with your provider about disease prevention and staying healthy. In between checkups, there are plenty of things you can do on your own. Experts have done a lot of research about which lifestyle changes and preventive measures are most likely to keep you healthy. Ask your health care provider for more information. WEIGHT AND DIET  Eat a healthy diet  Be sure to include plenty of vegetables, fruits, low-fat dairy products, and lean protein.  Do not   eat a lot of foods high in solid fats, added sugars, or salt.  Get regular exercise. This  is one of the most important things you can do for your health.  Most adults should exercise for at least 150 minutes each week. The exercise should increase your heart rate and make you sweat (moderate-intensity exercise).  Most adults should also do strengthening exercises at least twice a week. This is in addition to the moderate-intensity exercise.  Maintain a healthy weight  Body mass index (BMI) is a measurement that can be used to identify possible weight problems. It estimates body fat based on height and weight. Your health care provider can help determine your BMI and help you achieve or maintain a healthy weight.  For females 38 years of age and older:   A BMI below 18.5 is considered underweight.  A BMI of 18.5 to 24.9 is normal.  A BMI of 25 to 29.9 is considered overweight.  A BMI of 30 and above is considered obese.  Watch levels of cholesterol and blood lipids  You should start having your blood tested for lipids and cholesterol at 38 years of age, then have this test every 5 years.  You may need to have your cholesterol levels checked more often if:  Your lipid or cholesterol levels are high.  You are older than 38 years of age.  You are at high risk for heart disease.  CANCER SCREENING   Lung Cancer  Lung cancer screening is recommended for adults 7-57 years old who are at high risk for lung cancer because of a history of smoking.  A yearly low-dose CT scan of the lungs is recommended for people who:  Currently smoke.  Have quit within the past 15 years.  Have at least a 30-pack-year history of smoking. A pack year is smoking an average of one pack of cigarettes a day for 1 year.  Yearly screening should continue until it has been 15 years since you quit.  Yearly screening should stop if you develop a health problem that would prevent you from having lung cancer treatment.  Breast Cancer  Practice breast self-awareness. This means understanding  how your breasts normally appear and feel.  It also means doing regular breast self-exams. Let your health care provider know about any changes, no matter how small.  If you are in your 20s or 30s, you should have a clinical breast exam (CBE) by a health care provider every 1-3 years as part of a regular health exam.  If you are 29 or older, have a CBE every year. Also consider having a breast X-ray (mammogram) every year.  If you have a family history of breast cancer, talk to your health care provider about genetic screening.  If you are at high risk for breast cancer, talk to your health care provider about having an MRI and a mammogram every year.  Breast cancer gene (BRCA) assessment is recommended for women who have family members with BRCA-related cancers. BRCA-related cancers include:  Breast.  Ovarian.  Tubal.  Peritoneal cancers.  Results of the assessment will determine the need for genetic counseling and BRCA1 and BRCA2 testing. Cervical Cancer Your health care provider may recommend that you be screened regularly for cancer of the pelvic organs (ovaries, uterus, and vagina). This screening involves a pelvic examination, including checking for microscopic changes to the surface of your cervix (Pap test). You may be encouraged to have this screening done every 3 years, beginning at  age 91.  For women ages 66-65, health care providers may recommend pelvic exams and Pap testing every 3 years, or they may recommend the Pap and pelvic exam, combined with testing for human papilloma virus (HPV), every 5 years. Some types of HPV increase your risk of cervical cancer. Testing for HPV may also be done on women of any age with unclear Pap test results.  Other health care providers may not recommend any screening for nonpregnant women who are considered low risk for pelvic cancer and who do not have symptoms. Ask your health care provider if a screening pelvic exam is right for  you.  If you have had past treatment for cervical cancer or a condition that could lead to cancer, you need Pap tests and screening for cancer for at least 20 years after your treatment. If Pap tests have been discontinued, your risk factors (such as having a new sexual partner) need to be reassessed to determine if screening should resume. Some women have medical problems that increase the chance of getting cervical cancer. In these cases, your health care provider may recommend more frequent screening and Pap tests. Colorectal Cancer  This type of cancer can be detected and often prevented.  Routine colorectal cancer screening usually begins at 38 years of age and continues through 38 years of age.  Your health care provider may recommend screening at an earlier age if you have risk factors for colon cancer.  Your health care provider may also recommend using home test kits to check for hidden blood in the stool.  A small camera at the end of a tube can be used to examine your colon directly (sigmoidoscopy or colonoscopy). This is done to check for the earliest forms of colorectal cancer.  Routine screening usually begins at age 46.  Direct examination of the colon should be repeated every 5-10 years through 38 years of age. However, you may need to be screened more often if early forms of precancerous polyps or small growths are found. Skin Cancer  Check your skin from head to toe regularly.  Tell your health care provider about any new moles or changes in moles, especially if there is a change in a mole's shape or color.  Also tell your health care provider if you have a mole that is larger than the size of a pencil eraser.  Always use sunscreen. Apply sunscreen liberally and repeatedly throughout the day.  Protect yourself by wearing long sleeves, pants, a wide-brimmed hat, and sunglasses whenever you are outside. HEART DISEASE, DIABETES, AND HIGH BLOOD PRESSURE   High blood  pressure causes heart disease and increases the risk of stroke. High blood pressure is more likely to develop in:  People who have blood pressure in the high end of the normal range (130-139/85-89 mm Hg).  People who are overweight or obese.  People who are African American.  If you are 10-97 years of age, have your blood pressure checked every 3-5 years. If you are 37 years of age or older, have your blood pressure checked every year. You should have your blood pressure measured twice--once when you are at a hospital or clinic, and once when you are not at a hospital or clinic. Record the average of the two measurements. To check your blood pressure when you are not at a hospital or clinic, you can use:  An automated blood pressure machine at a pharmacy.  A home blood pressure monitor.  If you are between 3  years and 45 years old, ask your health care provider if you should take aspirin to prevent strokes.  Have regular diabetes screenings. This involves taking a blood sample to check your fasting blood sugar level.  If you are at a normal weight and have a low risk for diabetes, have this test once every three years after 38 years of age.  If you are overweight and have a high risk for diabetes, consider being tested at a younger age or more often. PREVENTING INFECTION  Hepatitis B  If you have a higher risk for hepatitis B, you should be screened for this virus. You are considered at high risk for hepatitis B if:  You were born in a country where hepatitis B is common. Ask your health care provider which countries are considered high risk.  Your parents were born in a high-risk country, and you have not been immunized against hepatitis B (hepatitis B vaccine).  You have HIV or AIDS.  You use needles to inject street drugs.  You live with someone who has hepatitis B.  You have had sex with someone who has hepatitis B.  You get hemodialysis treatment.  You take certain  medicines for conditions, including cancer, organ transplantation, and autoimmune conditions. Hepatitis C  Blood testing is recommended for:  Everyone born from [birth year] through 1964.  Anyone with known risk factors for hepatitis C. Sexually transmitted infections (STIs)  You should be screened for sexually transmitted infections (STIs) including gonorrhea and chlamydia if:  You are sexually active and are younger than 38 years of age.  You are older than 38 years of age and your health care provider tells you that you are at risk for this type of infection.  Your sexual activity has changed since you were last screened and you are at an increased risk for chlamydia or gonorrhea. Ask your health care provider if you are at risk.  If you do not have HIV, but are at risk, it may be recommended that you take a prescription medicine daily to prevent HIV infection. This is called pre-exposure prophylaxis (PrEP). You are considered at risk if:  You are sexually active and do not regularly use condoms or know the HIV status of your partner(s).  You take drugs by injection.  You are sexually active with a partner who has HIV. Talk with your health care provider about whether you are at high risk of being infected with HIV. If you choose to begin PrEP, you should first be tested for HIV. You should then be tested every 3 months for as long as you are taking PrEP.  PREGNANCY   If you are premenopausal and you may become pregnant, ask your health care provider about preconception counseling.  If you may become pregnant, take 400 to 800 micrograms (mcg) of folic acid every day.  If you want to prevent pregnancy, talk to your health care provider about birth control (contraception). OSTEOPOROSIS AND MENOPAUSE   Osteoporosis is a disease in which the bones lose minerals and strength with aging. This can result in serious bone fractures. Your risk for osteoporosis can be identified using a bone  density scan.  If you are 2 years of age or older, or if you are at risk for osteoporosis and fractures, ask your health care provider if you should be screened.  Ask your health care provider whether you should take a calcium or vitamin D supplement to lower your risk for osteoporosis.  Menopause may have  certain physical symptoms and risks.  Hormone replacement therapy may reduce some of these symptoms and risks. Talk to your health care provider about whether hormone replacement therapy is right for you.  HOME CARE INSTRUCTIONS   Schedule regular health, dental, and eye exams.  Stay current with your immunizations.   Do not use any tobacco products including cigarettes, chewing tobacco, or electronic cigarettes.  If you are pregnant, do not drink alcohol.  If you are breastfeeding, limit how much and how often you drink alcohol.  Limit alcohol intake to no more than 1 drink per day for nonpregnant women. One drink equals 12 ounces of beer, 5 ounces of wine, or 1 ounces of hard liquor.  Do not use street drugs.  Do not share needles.  Ask your health care provider for help if you need support or information about quitting drugs.  Tell your health care provider if you often feel depressed.  Tell your health care provider if you have ever been abused or do not feel safe at home.   This information is not intended to replace advice given to you by your health care provider. Make sure you discuss any questions you have with your health care provider.   Document Released: 01/22/2011 Document Revised: 07/30/2014 Document Reviewed: 06/10/2013 Elsevier Interactive Patient Education [employer].

## 2015-05-09 NOTE — Progress Notes (Signed)
Subjective:    Patient ID: Alejandra Mcmahon, female    DOB: 04/24/1976, 39 y.o.   MRN: [id number]  Pt presents to the office today for chronic follow up for GAD and Depression.  Depression      The patient presents with depression.(Pt has been out lexapro for over a week)  This is a chronic problem.  The current episode started more than 1 year ago.   The onset quality is gradual.   The problem occurs intermittently.  The problem has been waxing and waning since onset.  Associated symptoms include insomnia, restlessness and sad.  Associated symptoms include no helplessness, no hopelessness, no headaches and no suicidal ideas.     The symptoms are aggravated by family issues.  Past treatments include SSRIs - Selective serotonin reuptake inhibitors.  Past medical history includes anxiety and depression.   Anxiety Presents for follow-up visit. Onset was 1 to 6 months ago. The problem has been waxing and waning. Symptoms include depressed mood, excessive worry, insomnia, irritability, nervous/anxious behavior and restlessness. Patient reports no palpitations, panic, shortness of breath or suicidal ideas. Primary symptoms comment: Pt has been out lexapro for over a week. Symptoms occur occasionally. The severity of symptoms is moderate. The symptoms are aggravated by family issues.   Her past medical history is significant for anxiety/panic attacks and depression. Past treatments include SSRIs and benzodiazephines. The treatment provided moderate relief. Compliance with prior treatments has been good.      Review of Systems  Constitutional: Positive for irritability.  HENT: Negative.   Eyes: Negative.   Respiratory: Negative.  Negative for shortness of breath.   Cardiovascular: Negative.  Negative for palpitations.  Gastrointestinal: Negative.   Endocrine: Negative.   Genitourinary: Negative.   Musculoskeletal: Negative.   Neurological: Negative.  Negative for headaches.  Hematological:  Negative.   Psychiatric/Behavioral: Positive for depression. Negative for suicidal ideas. The patient is nervous/anxious and has insomnia.   All other systems reviewed and are negative.      Objective:   Physical Exam  Constitutional: She is oriented to person, place, and time. She appears well-developed and well-nourished. No distress.  HENT:  Head: Normocephalic and atraumatic.  Right Ear: External ear normal.  Left Ear: External ear normal.  Nose: Nose normal.  Mouth/Throat: Oropharynx is clear and moist.  Eyes: Pupils are equal, round, and reactive to light.  Neck: Normal range of motion. Neck supple. No thyromegaly present.  Cardiovascular: Normal rate, regular rhythm, normal heart sounds and intact distal pulses.   No murmur heard. Pulmonary/Chest: Effort normal and breath sounds normal. No respiratory distress. She has no wheezes.  Abdominal: Soft. Bowel sounds are normal. She exhibits no distension. There is no tenderness.  Musculoskeletal: Normal range of motion. She exhibits no edema or tenderness.  Neurological: She is alert and oriented to person, place, and time. She has normal reflexes. No cranial nerve deficit.  Skin: Skin is warm and dry.  Psychiatric: She has a normal mood and affect. Her behavior is normal. Judgment and thought content normal.  Vitals reviewed.     BP 126/88 mmHg  Pulse 118  Temp(Src) 97.9 F (36.6 C) (Oral)  Ht '5\' 4"'  (1.626 m)  Wt 169 lb 12.8 oz (77.021 kg)  BMI 29.13 kg/m2     Assessment & Plan:  1. Gastroesophageal reflux disease, esophagitis presence not specified - CMP14+EGFR  2. Depression - CMP14+EGFR - busPIRone (BUSPAR) 15 MG tablet; Take 1 tablet (15 mg total) by mouth daily.  Dispense:  90 tablet; Refill: 3 - escitalopram (LEXAPRO) 20 MG tablet; Take 1 tablet (20 mg total) by mouth daily.  Dispense: 90 tablet; Refill: 3  3. Allergic rhinitis, unspecified allergic rhinitis type - CMP14+EGFR  4. GAD (generalized anxiety  disorder) -Stress management discussed - CMP14+EGFR - busPIRone (BUSPAR) 15 MG tablet; Take 1 tablet (15 mg total) by mouth daily.  Dispense: 90 tablet; Refill: 3 - escitalopram (LEXAPRO) 20 MG tablet; Take 1 tablet (20 mg total) by mouth daily.  Dispense: 90 tablet; Refill: 3  5. Encounter for surveillance of contraceptive pills - CMP14+EGFR - Drospirenone-Ethinyl Estradiol-Levomefol (SAFYRAL) 3-0.03-0.451 MG tablet; Take 1 tablet by mouth daily.  Dispense: 84 tablet; Refill: 3   Continue all meds Labs pending Health Maintenance reviewed Diet and exercise encouraged RTO 1 year  Evelina Dun, FNP

## 2015-05-10 LAB — CMP14+EGFR
A/G RATIO: 1.5 (ref 1.1–2.5)
ALBUMIN: 4 g/dL (ref 3.5–5.5)
ALT: 21 IU/L (ref 0–32)
AST: 22 IU/L (ref 0–40)
Alkaline Phosphatase: 62 IU/L (ref 39–117)
BUN / CREAT RATIO: 12 (ref 8–20)
BUN: 10 mg/dL (ref 6–20)
CALCIUM: 9.4 mg/dL (ref 8.7–10.2)
CHLORIDE: 99 mmol/L (ref 97–106)
CO2: 24 mmol/L (ref 18–29)
Creatinine, Ser: 0.81 mg/dL (ref 0.57–1.00)
GFR, EST AFRICAN AMERICAN: 107 mL/min/{1.73_m2} (ref >59)
GFR, EST NON AFRICAN AMERICAN: 92 mL/min/{1.73_m2} (ref >59)
GLOBULIN, TOTAL: 2.6 g/dL (ref 1.5–4.5)
Glucose: 112 mg/dL — ABNORMAL HIGH (ref 65–99)
POTASSIUM: 3.8 mmol/L (ref 3.5–5.2)
SODIUM: 140 mmol/L (ref 136–144)
TOTAL PROTEIN: 6.6 g/dL (ref 6.0–8.5)

## 2015-06-02 ENCOUNTER — Encounter: Payer: Self-pay | Admitting: Family

## 2015-06-02 ENCOUNTER — Ambulatory Visit (INDEPENDENT_AMBULATORY_CARE_PROVIDER_SITE_OTHER): Payer: BC Managed Care – PPO | Admitting: Family

## 2015-06-02 VITALS — BP 125/87 | HR 95 | Temp 97.6°F | Ht 64.0 in | Wt 173.8 lb

## 2015-06-02 DIAGNOSIS — S39012A Strain of muscle, fascia and tendon of lower back, initial encounter: Secondary | ICD-10-CM | POA: Diagnosis not present

## 2015-06-02 MED ORDER — KETOROLAC TROMETHAMINE 60 MG/2ML IM SOLN
60.0000 mg | Freq: Once | INTRAMUSCULAR | Status: AC
  Administered 2015-06-02: 60 mg via INTRAMUSCULAR

## 2015-06-02 MED ORDER — NAPROXEN 500 MG PO TABS: 500.0000 mg | ORAL_TABLET | Freq: Two times a day (BID) | ORAL | Status: DC

## 2015-06-02 MED ORDER — METHYLPREDNISOLONE ACETATE 80 MG/ML IJ SUSP
80.0000 mg | Freq: Once | INTRAMUSCULAR | Status: AC
  Administered 2015-06-02: 80 mg via INTRAMUSCULAR

## 2015-06-02 MED ORDER — CYCLOBENZAPRINE HCL 10 MG PO TABS: 10.0000 mg | ORAL_TABLET | Freq: Three times a day (TID) | ORAL | Status: DC | PRN

## 2015-06-02 NOTE — Patient Instructions (Signed)
Mid-Back Strain With Rehab   A strain is an injury in which a tendon or muscle is torn. The muscles and tendons of the mid-back are vulnerable to strains. However, these muscles and tendons are very strong and require a great force to be injured. The muscles of the mid-back are responsible for stabilizing the spinal column, as well as spinal twisting (rotation). Strains are classified into three categories. Grade 1 strains cause pain, but the tendon is not lengthened. Grade 2 strains include a lengthened ligament, due to the ligament being stretched or partially ruptured. With grade 2 strains there is still function, although the function may be decreased. Grade 3 strains involve a complete tear of the tendon or muscle, and function is usually impaired.  SYMPTOMS   · Pain in the middle of the back.  · Pain that may affect only one side, and is worse with movement.  · Muscle spasms, and often swelling in the back.  · Loss of strength of the back muscles.  · Crackling sound (crepitation) when the muscles are touched.  CAUSES   Mid-back strains occur when a force is placed on the muscles or tendons that is greater than they can handle. Common causes of injury include:  · Ongoing overuse of the muscle-tendon units in the middle back, usually from incorrect body posture.  · A single violent injury or force applied to the back.  RISK INCREASES WITH:  · Sports that involve twisting forces on the spine or a lot of bending at the waist (football, rugby, weightlifting, bowling, golf, tennis, speed skating, racquetball, swimming, running, gymnastics, diving).  · Poor strength and flexibility.  · Failure to warm up properly before activity.  · Family history of low back pain or disk disorders.  · Previous back injury or surgery (especially fusion).  PREVENTION  · Learn and use proper sports technique.  · Warm up and stretch properly before activity.  · Allow for adequate recovery between workouts.  · Maintain physical  fitness:    Strength, flexibility, and endurance.    Cardiovascular fitness.  PROGNOSIS   If treated properly, mid-back strains usually heal within 6 weeks.  RELATED COMPLICATIONS   · Frequently recurring symptoms, resulting in a chronic problem. Properly treating the problem the first time decreases frequency of recurrence.  · Chronic inflammation, scarring, and partial muscle-tendon tear.  · Delayed healing or resolution of symptoms, especially if activity is resumed too soon.  · Prolonged disability.  TREATMENT  Treatment first involves the use of ice and medicine, to reduce pain and inflammation. As the pain begins to subside, you may begin strengthening and stretching exercises to improve body posture and sport technique. These exercises may be performed at home or with a therapist. Severe injuries may require referral to a therapist for further evaluation and treatment, such as ultrasound. Corticosteroid injections may be given to help reduce inflammation. Biofeedback (watching monitors of your body processes) and psychotherapy may also be prescribed. Prolonged bed rest is felt to do more harm than good. Massage may help break the muscle spasms. Sometimes, an injection of cortisone, with or without local anesthetics, may be given to help relieve the pain and spasms.  MEDICATION   · If pain medicine is needed, nonsteroidal anti-inflammatory medicines (aspirin and ibuprofen), or other minor pain relievers (acetaminophen), are often advised.  · Do not take pain medicine for 7 days before surgery.  · Prescription pain relievers may be given, if your caregiver thinks they are needed. Use   only as directed and only as much as you need.  · Ointments applied to the skin may be helpful.  · Corticosteroid injections may be given by your caregiver. These injections should be reserved for the most serious cases, because they may only be given a certain number of times.  HEAT AND COLD:   · Cold treatment (icing) should be  applied for 10 to 15 minutes every 2 to 3 hours for inflammation and pain, and immediately after activity that aggravates your symptoms. Use ice packs or an ice massage.  · Heat treatment may be used before performing stretching and strengthening activities prescribed by your caregiver, physical therapist, or athletic trainer. Use a heat pack or a warm water soak.  SEEK IMMEDIATE MEDICAL CARE IF:  · Symptoms get worse or do not improve in 2 to 4 weeks, despite treatment.  · You develop numbness, weakness, or loss of bowel or bladder function.  · New, unexplained symptoms develop. (Drugs used in treatment may produce side effects.)  EXERCISES  RANGE OF MOTION (ROM) AND STRETCHING EXERCISES - Mid-Back Strain  These exercises may help you when beginning to rehabilitate your injury. In order to successfully resolve your symptoms, you must improve your posture. These exercises are designed to help reduce the forward-head and rounded-shoulder posture which contributes to this condition. Your symptoms may resolve with or without further involvement from your physician, physical therapist or athletic trainer. While completing these exercises, remember:   · Restoring tissue flexibility helps normal motion to return to the joints. This allows healthier, less painful movement and activity.  · An effective stretch should be held for at least 30 seconds.  · A stretch should never be painful. You should only feel a gentle lengthening or release in the stretched tissue.  STRETCH - Axial Extension  · Stand or sit on a firm surface. Assume a good posture: chest up, shoulders drawn back, stomach muscles slightly tense, knees unlocked (if standing) and feet hip width apart.  · Slowly retract your chin, so your head slides back and your chin slightly lowers. Continue to look straight ahead.  · You should feel a gentle stretch in the back of your head. Be certain not to feel an aggressive stretch since this can cause headaches  later.  · Hold for __________ seconds.  Repeat __________ times. Complete this exercise __________ times per day.  RANGE OF MOTION- Upper Thoracic Extension  · Sit on a firm chair with a high back. Assume a good posture: chest up, shoulders drawn back, abdominal muscles slightly tense, and feet hip width apart. Place a small pillow or folded towel in the curve of your lower back, if you are having difficulty maintaining good posture.  · Gently brace your neck with your hands, allowing your arms to rest on your chest.  · Continue to support your neck and slowly extend your back over the chair. You will feel a stretch across your upper back.  · Hold __________ seconds. Slowly return to the starting position.  Repeat __________ times. Complete this exercise __________ times per day.  RANGE OF MOTION- Mid-Thoracic Extension  · Roll a towel so that it is about 4 inches in diameter.  · Position the towel lengthwise. Lay on the towel so that your spine, but not your shoulder blades, are supported.  · You should feel your mid-back arching toward the floor. To increase the stretch, extend your arms away from your body.  · Hold for __________ seconds.    Repeat exercise __________ times, __________ times per day.  STRENGTHENING EXERCISES - Mid-Back Strain  These exercises may help you when beginning to rehabilitate your injury. They may resolve your symptoms with or without further involvement from your physician, physical therapist or athletic trainer. While completing these exercises, remember:   · Muscles can gain both the endurance and the strength needed for everyday activities through controlled exercises.  · Complete these exercises as instructed by your physician, physical therapist or athletic trainer. Increase the resistance and repetitions only as guided by your caregiver.  · You may experience muscle soreness or fatigue, but the pain or discomfort you are trying to eliminate should never worsen during these  exercises. If this pain does worsen, stop and make certain you are following the directions exactly. If the pain is still present after adjustments, discontinue the exercise until you can discuss the trouble with your caregiver.  STRENGTHENING - Quadruped, Opposite UE/LE Lift  · Assume a hands and knees position on a firm surface. Keep your hands under your shoulders and your knees under your hips. You may place padding under your knees for comfort.  · Find your neutral spine and gently tense your abdominal muscles so that you can maintain this position. Your shoulders and hips should form a rectangle that is parallel with the floor and is not twisted.  · Keeping your trunk steady, lift your right hand no higher than your shoulder and then your left leg no higher than your hip. Make sure you are not holding your breath. Hold this position __________ seconds.  · Continuing to keep your abdominal muscles tense and your back steady, slowly return to your starting position. Repeat with the opposite arm and leg.  Repeat __________ times. Complete this exercise __________ times per day.   STRENGTH - Shoulder Extensors  · Secure a rubber exercise band or tubing to a fixed object (table, pole) so that it is at the height of your shoulders when you are either standing, or sitting on a firm armless chair.  · With a thumbs-up grip, grasp an end of the band in each hand. Straighten your elbows and lift your hands straight in front of you at shoulder height. Step back away from the secured end of band, until it becomes tense.  · Squeezing your shoulder blades together, pull your hands down to the sides of your thighs. Do not allow your hands to go behind you.  · Hold for __________ seconds. Slowly ease the tension on the band, as you reverse the directions and return to the starting position.  Repeat __________ times. Complete this exercise __________ times per day.   STRENGTH - Horizontal Abductors  Choose one of the two  positions to complete this exercise.  Prone: lying on stomach:  · Lie on your stomach on a firm surface so that your right / left arm overhangs the edge. Rest your forehead on your opposite forearm. With your palm facing the floor and your elbow straight, hold a __________ weight in your hand.  · Squeeze your right / left shoulder blade to your mid-back spine and then slowly raise your arm to the height of the bed.  · Hold for __________ seconds. Slowly reverse the directions and return to the starting position, controlling the weight as you lower your arm.  Repeat __________ times. Complete this exercise __________ times per day.  Standing:   · Secure a rubber exercise band or tubing, so that it is at the height   of your shoulders when you are either standing, or sitting on a firm armless chair.  · Grasp an end of the band in each hand and have your palms face each other. Straighten your elbows and lift your hands straight in front of you at shoulder height. Step back away from the secured end of band, until it becomes tense.  · Squeeze your shoulder blades together. Keeping your elbows locked and your hands at shoulder height, spread your arms apart, forming a "T" shape with your body. Hold __________ seconds. Slowly ease the tension on the band, as you reverse the directions and return to the starting position.  Repeat __________ times. Complete this exercise __________ times per day.  STRENGTH - Scapular Retractors and External Rotators, Rowing  · Secure a rubber exercise band or tubing, so that it is at the height of your shoulders when you are either standing, or sitting on a firm armless chair.  · With a palm-down grip, grasp an end of the band in each hand. Straighten your elbows and lift your hands straight in front of you at shoulder height. Step back away from the secured end of band, until it becomes tense.  · Step 1: Squeeze your shoulder blades together. Bending your elbows, draw your hands to your  chest as if you are rowing a boat. At the end of this motion, your hands and elbow should be at shoulder height and your elbows should be out to your sides.  · Step 2: Rotate your shoulder to raise your hands above your head. Your forearms should be vertical and your upper arms should be horizontal.  · Hold for __________ seconds. Slowly ease the tension on the band, as you reverse the directions and return to the starting position.  Repeat __________ times. Complete this exercise __________ times per day.   POSTURE AND BODY MECHANICS CONSIDERATIONS - Mid-Back Strain  Keeping correct posture when sitting, standing or completing your activities will reduce the stress put on different body tissues, allowing injured tissues a chance to heal and limiting painful experiences. The following are general guidelines for improved posture. Your physician or physical therapist will provide you with any instructions specific to your needs. While reading these guidelines, remember:  · The exercises prescribed by your provider will help you have the flexibility and strength to maintain correct postures.  · The correct posture provides the best environment for your joints to work. All of your joints have less wear and tear when properly supported by a spine with good posture. This means you will experience a healthier, less painful body.  · Correct posture must be practiced with all of your activities, especially prolonged sitting and standing. Correct posture is as important when doing repetitive low-stress activities (typing) as it is when doing a single heavy-load activity (lifting).  PROPER SITTING POSTURE  In order to minimize stress and discomfort on your spine, you must sit with correct posture. Sitting with good posture should be effortless for a healthy body. Returning to good posture is a gradual process. Many people can work toward this most comfortably by using various supports until they have the flexibility and  strength to maintain this posture on their own.  When sitting with proper posture, your ears will fall over your shoulders and your shoulders will fall over your hips. You should use the back of the chair to support your upper back. Your lower back will be in a neutral position, just slightly arched. You may place   a small pillow or folded towel at the base of your low back for   support.   When working at a desk, create an environment that supports good, upright posture. Without extra support, muscles fatigue and lead to excessive strain on joints and other tissues. Keep these recommendations in mind:  CHAIR:  · A chair should be able to slide under your desk when your back makes contact with the back of the chair. This allows you to work closely.  · The chair's height should allow your eyes to be level with the upper part of your monitor and your hands to be slightly lower than your elbows.  BODY POSITION  · Your feet should make contact with the floor. If this is not possible, use a foot rest.  · Keep your ears over your shoulders. This will reduce stress on your neck and lower back.  INCORRECT SITTING POSTURES  If you are feeling tired and unable to assume a healthy sitting posture, do not slouch or slump. This puts excessive strain on your back tissues, causing more damage and pain. Healthier options include:  · Using more support, like a lumbar pillow.  · Switching tasks to something that requires you to be upright or walking.  · Talking a brief walk.  · Lying down to rest in a neutral-spine position.  CORRECT STANDING POSTURES  Proper standing posture should be assumed with all daily activities, even if they only take a few moments, like when brushing your teeth. As in sitting, your ears should fall over your shoulders and your shoulders should fall over your hips. You should keep a slight tension in your abdominal muscles to brace your spine. Your tailbone should point down to the ground, not behind your  body, resulting in an over-extended swayback posture.   INCORRECT STANDING POSTURES  Common incorrect standing postures include a forward head, locked knees, and an excessive swayback.  WALKING  Walk with an upright posture. Your ears, shoulders and hips should all line-up.  CORRECT LIFTING TECHNIQUES  DO :   · Assume a wide stance. This will provide you more stability and the opportunity to get as close as possible to the object which you are lifting.  · Tense your abdominals to brace your spine. Bend at the knees and hips. Keeping your back locked in a neutral-spine position, lift using your leg muscles. Lift with your legs, keeping your back straight.  · Test the weight of unknown objects before attempting to lift them.  · Try to keep your elbows locked down at your sides in order get the best strength from your shoulders when carrying an object.  · Always ask for help when lifting heavy or awkward objects.  INCORRECT LIFTING TECHNIQUES  DO NOT:   · Lock your knees when lifting, even if it is a small object.  · Bend and twist. Pivot at your feet or move your feet when needing to change directions.  · Assume that you can safely pick up even a paperclip without proper posture.     This information is not intended to replace advice given to you by your health care provider. Make sure you discuss any questions you have with your health care provider.     Document Released: 07/09/2005 Document Revised: 11/23/2014 Document Reviewed: 10/21/2008  [employer]vier Interactive Patient Education ©2016 [employer]

## 2015-06-02 NOTE — Progress Notes (Signed)
   Subjective:    Patient ID: Alejandra Mcmahon, female    DOB: Jul 01, 1976, 39 y.o.   MRN: [id number]  Back Pain This is a new problem. The current episode started more than 1 month ago. The problem occurs constantly. The problem has been waxing and waning since onset. The pain is present in the thoracic spine. The quality of the pain is described as stabbing. The pain is at a severity of 4/10. The pain is moderate. The pain is worse during the night. The symptoms are aggravated by lying down. Stiffness is present at night. Pertinent negatives include no bladder incontinence, bowel incontinence, dysuria, headaches, leg pain, tingling or weakness. She has tried bed rest, NSAIDs, ice and heat for the symptoms. The treatment provided mild relief.      Review of Systems  Constitutional: Negative.   HENT: Negative.   Eyes: Negative.   Respiratory: Negative.  Negative for shortness of breath.   Cardiovascular: Negative.  Negative for palpitations.  Gastrointestinal: Negative.  Negative for bowel incontinence.  Endocrine: Negative.   Genitourinary: Negative.  Negative for bladder incontinence and dysuria.  Musculoskeletal: Positive for back pain.  Neurological: Negative.  Negative for tingling, weakness and headaches.  Hematological: Negative.   Psychiatric/Behavioral: Negative.   All other systems reviewed and are negative.      Objective:   Physical Exam  Constitutional: She is oriented to person, place, and time. She appears well-developed and well-nourished. No distress.  HENT:  Head: Normocephalic and atraumatic.  Right Ear: External ear normal.  Mouth/Throat: Oropharynx is clear and moist.  Eyes: Pupils are equal, round, and reactive to light.  Neck: Normal range of motion. Neck supple. No thyromegaly present.  Cardiovascular: Normal rate, regular rhythm, normal heart sounds and intact distal pulses.   No murmur heard. Pulmonary/Chest: Effort normal and breath sounds normal. No  respiratory distress. She has no wheezes.  Abdominal: Soft. Bowel sounds are normal. She exhibits no distension. There is no tenderness.  Musculoskeletal: Normal range of motion. She exhibits no edema or tenderness.  Neurological: She is alert and oriented to person, place, and time. She has normal reflexes. No cranial nerve deficit.  Skin: Skin is warm and dry.  Psychiatric: She has a normal mood and affect. Her behavior is normal. Judgment and thought content normal.  Vitals reviewed.     BP 125/87 mmHg  Pulse 95  Temp(Src) 97.6 F (36.4 C) (Oral)  Ht 5\' 4"  (1.626 m)  Wt 173 lb 12.8 oz (78.835 kg)  BMI 29.82 kg/m2     Assessment & Plan:  1. Back strain, initial encounter -Rest -Ice and heat as needed -Exercises discussed -Sedation precaution discussed -No other NSAID's -RTO prn  - methylPREDNISolone acetate (DEPO-MEDROL) injection 80 mg; Inject 1 mL (80 mg total) into the muscle once. - ketorolac (TORADOL) injection 60 mg; Inject 2 mLs (60 mg total) into the muscle once. - cyclobenzaprine (FLEXERIL) 10 MG tablet; Take 1 tablet (10 mg total) by mouth 3 (three) times daily as needed for muscle spasms.  Dispense: 30 tablet; Refill: 2 - naproxen (NAPROSYN) 500 MG tablet; Take 1 tablet (500 mg total) by mouth 2 (two) times daily with a meal.  Dispense: 30 tablet; Refill: 2  Jannifer Rodneyhristy Hawks, FNP

## 2015-09-25 ENCOUNTER — Other Ambulatory Visit: Payer: Self-pay | Admitting: Family

## 2015-09-26 NOTE — Telephone Encounter (Signed)
Last seen - hawks 11/ 2016 - for back strain

## 2015-10-11 ENCOUNTER — Other Ambulatory Visit: Payer: Self-pay | Admitting: Family

## 2015-10-14 ENCOUNTER — Ambulatory Visit (INDEPENDENT_AMBULATORY_CARE_PROVIDER_SITE_OTHER): Payer: BC Managed Care – PPO | Admitting: Family

## 2015-10-14 ENCOUNTER — Encounter: Payer: Self-pay | Admitting: Family

## 2015-10-14 VITALS — BP 135/88 | HR 126 | Temp 101.3°F | Ht 64.0 in | Wt 160.0 lb

## 2015-10-14 DIAGNOSIS — J101 Influenza due to other identified influenza virus with other respiratory manifestations: Secondary | ICD-10-CM

## 2015-10-14 DIAGNOSIS — R6889 Other general symptoms and signs: Secondary | ICD-10-CM | POA: Diagnosis not present

## 2015-10-14 LAB — VERITOR FLU A/B WAIVED
INFLUENZA A: NEGATIVE
INFLUENZA B: POSITIVE — AB

## 2015-10-14 MED ORDER — OSELTAMIVIR PHOSPHATE 75 MG PO CAPS: 75.0000 mg | ORAL_CAPSULE | Freq: Two times a day (BID) | ORAL | Status: DC

## 2015-10-14 NOTE — Patient Instructions (Signed)

## 2015-10-14 NOTE — Progress Notes (Signed)
Subjective:    Patient ID: Alejandra Mcmahon, female    DOB: 06/24/1976, 40 y.o.   MRN: [id number]  Fever  This is a new problem. The current episode started yesterday. The problem occurs intermittently. The problem has been waxing and waning. The maximum temperature noted was 102 to 102.9 F. Associated symptoms include congestion, coughing, ear pain, headaches, muscle aches, nausea, sleepiness, a sore throat and wheezing. Pertinent negatives include no diarrhea or vomiting. She has tried acetaminophen and NSAIDs for the symptoms. The treatment provided mild relief.  Cough Associated symptoms include ear pain, a fever, headaches, a sore throat and wheezing. Pertinent negatives include no shortness of breath.  Headache  Associated symptoms include coughing, ear pain, a fever, muscle aches, nausea and a sore throat. Pertinent negatives include no vomiting.      Review of Systems  Constitutional: Positive for fever.  HENT: Positive for congestion, ear pain and sore throat.   Eyes: Negative.   Respiratory: Positive for cough and wheezing. Negative for shortness of breath.   Cardiovascular: Negative.  Negative for palpitations.  Gastrointestinal: Positive for nausea. Negative for vomiting and diarrhea.  Endocrine: Negative.   Genitourinary: Negative.   Musculoskeletal: Negative.   Neurological: Positive for headaches.  Hematological: Negative.   Psychiatric/Behavioral: Negative.   All other systems reviewed and are negative.      Objective:   Physical Exam  Constitutional: She is oriented to person, place, and time. She appears well-developed and well-nourished. She has a sickly appearance. No distress.  HENT:  Head: Normocephalic and atraumatic.  Right Ear: Tympanic membrane is erythematous.  Nasal passage erythemas with mild swelling  Oropharynx erythemas   Eyes: Pupils are equal, round, and reactive to light.  Neck: Normal range of motion. Neck supple. No thyromegaly present.    Cardiovascular: Normal rate, regular rhythm, normal heart sounds and intact distal pulses.   No murmur heard. Pulmonary/Chest: Effort normal and breath sounds normal. No respiratory distress. She has no wheezes.  Abdominal: Soft. Bowel sounds are normal. She exhibits no distension. There is no tenderness.  Musculoskeletal: Normal range of motion. She exhibits no edema or tenderness.  Neurological: She is alert and oriented to person, place, and time. She has normal reflexes. No cranial nerve deficit.  Skin: Skin is warm and dry.  Psychiatric: She has a normal mood and affect. Her behavior is normal. Judgment and thought content normal.  Vitals reviewed.   BP 135/88 mmHg  Pulse 126  Temp(Src) 101.3 F (38.5 C) (Oral)  Ht 5\' 4"  (1.626 m)  Wt 160 lb (72.576 kg)  BMI 27.45 kg/m2       Assessment & Plan:  1. Flu-like symptoms - Veritor Flu A/B Waived - oseltamivir (TAMIFLU) 75 MG capsule; Take 1 capsule (75 mg total) by mouth 2 (two) times daily.  Dispense: 10 capsule; Refill: 0  2. Influenza B -Rest -Force fluids -Tylenol and motrin  -Good hand hygiene discussed  -RTO prn  - oseltamivir (TAMIFLU) 75 MG capsule; Take 1 capsule (75 mg total) by mouth 2 (two) times daily.  Dispense: 10 capsule; Refill: 0  Jannifer Rodney, FNP

## 2015-10-19 ENCOUNTER — Encounter: Payer: Self-pay | Admitting: Family

## 2015-10-22 ENCOUNTER — Other Ambulatory Visit: Payer: Self-pay | Admitting: Family

## 2015-10-24 ENCOUNTER — Other Ambulatory Visit: Payer: Self-pay | Admitting: Family

## 2016-03-16 ENCOUNTER — Other Ambulatory Visit: Payer: Self-pay | Admitting: Family

## 2016-03-16 DIAGNOSIS — F411 Generalized anxiety disorder: Secondary | ICD-10-CM

## 2016-03-16 DIAGNOSIS — F32A Depression, unspecified: Secondary | ICD-10-CM

## 2016-03-16 DIAGNOSIS — Z3041 Encounter for surveillance of contraceptive pills: Secondary | ICD-10-CM

## 2016-03-16 DIAGNOSIS — F329 Major depressive disorder, single episode, unspecified: Secondary | ICD-10-CM

## 2016-06-04 ENCOUNTER — Other Ambulatory Visit: Payer: Self-pay | Admitting: Family

## 2016-06-04 DIAGNOSIS — F411 Generalized anxiety disorder: Secondary | ICD-10-CM

## 2016-06-04 DIAGNOSIS — F32A Depression, unspecified: Secondary | ICD-10-CM

## 2016-06-04 DIAGNOSIS — F329 Major depressive disorder, single episode, unspecified: Secondary | ICD-10-CM

## 2016-06-04 DIAGNOSIS — Z3041 Encounter for surveillance of contraceptive pills: Secondary | ICD-10-CM

## 2016-06-18 ENCOUNTER — Other Ambulatory Visit: Payer: Self-pay | Admitting: Nurse Practitioner

## 2016-07-14 ENCOUNTER — Other Ambulatory Visit: Payer: Self-pay | Admitting: Family

## 2016-07-17 NOTE — Telephone Encounter (Signed)
Cannot refill this, told at last refill that she needed to be seen. Be must seen for refills now.

## 2016-07-31 ENCOUNTER — Other Ambulatory Visit: Payer: Self-pay | Admitting: Family

## 2016-07-31 MED ORDER — CYCLOBENZAPRINE HCL 10 MG PO TABS: 10.0000 mg | ORAL_TABLET | Freq: Three times a day (TID) | ORAL | 0 refills | Status: DC | PRN

## 2016-08-27 ENCOUNTER — Other Ambulatory Visit: Payer: Self-pay | Admitting: Family

## 2016-08-27 DIAGNOSIS — F411 Generalized anxiety disorder: Secondary | ICD-10-CM

## 2016-08-27 DIAGNOSIS — F32A Depression, unspecified: Secondary | ICD-10-CM

## 2016-08-27 DIAGNOSIS — Z3041 Encounter for surveillance of contraceptive pills: Secondary | ICD-10-CM

## 2016-08-27 DIAGNOSIS — F329 Major depressive disorder, single episode, unspecified: Secondary | ICD-10-CM

## 2016-09-16 ENCOUNTER — Other Ambulatory Visit: Payer: Self-pay | Admitting: Family

## 2016-09-17 NOTE — Telephone Encounter (Signed)
Has not been seen for anything since 09/2015. Hawks last for flu like

## 2016-09-24 ENCOUNTER — Other Ambulatory Visit: Payer: Self-pay | Admitting: Family

## 2016-09-24 MED ORDER — CYCLOBENZAPRINE HCL 10 MG PO TABS: 10.0000 mg | ORAL_TABLET | Freq: Three times a day (TID) | ORAL | 0 refills | Status: DC | PRN

## 2016-10-08 ENCOUNTER — Other Ambulatory Visit: Payer: Self-pay | Admitting: Family

## 2016-10-08 DIAGNOSIS — F329 Major depressive disorder, single episode, unspecified: Secondary | ICD-10-CM

## 2016-10-08 DIAGNOSIS — F32A Depression, unspecified: Secondary | ICD-10-CM

## 2016-10-08 DIAGNOSIS — F411 Generalized anxiety disorder: Secondary | ICD-10-CM

## 2016-10-09 MED ORDER — BUSPIRONE HCL 15 MG PO TABS: 15.0000 mg | ORAL_TABLET | Freq: Three times a day (TID) | ORAL | 1 refills | Status: DC

## 2016-11-09 ENCOUNTER — Other Ambulatory Visit: Payer: Self-pay | Admitting: Family

## 2016-11-09 DIAGNOSIS — F411 Generalized anxiety disorder: Secondary | ICD-10-CM

## 2016-11-09 DIAGNOSIS — F329 Major depressive disorder, single episode, unspecified: Secondary | ICD-10-CM

## 2016-11-09 DIAGNOSIS — F32A Depression, unspecified: Secondary | ICD-10-CM

## 2016-11-26 ENCOUNTER — Ambulatory Visit (INDEPENDENT_AMBULATORY_CARE_PROVIDER_SITE_OTHER): Payer: BC Managed Care – PPO | Admitting: Physician Assistant

## 2016-11-26 ENCOUNTER — Encounter: Payer: Self-pay | Admitting: Physician Assistant

## 2016-11-26 VITALS — BP 112/81 | HR 133 | Temp 99.3°F | Ht 64.0 in | Wt 177.6 lb

## 2016-11-26 DIAGNOSIS — J0110 Acute frontal sinusitis, unspecified: Secondary | ICD-10-CM

## 2016-11-26 DIAGNOSIS — H1033 Unspecified acute conjunctivitis, bilateral: Secondary | ICD-10-CM

## 2016-11-26 MED ORDER — AZITHROMYCIN 250 MG PO TABS: ORAL_TABLET | ORAL | 0 refills | Status: DC

## 2016-11-26 MED ORDER — GUAIFENESIN-CODEINE 100-10 MG/5ML PO SYRP: 5.0000 mL | ORAL_SOLUTION | Freq: Three times a day (TID) | ORAL | 0 refills | Status: DC | PRN

## 2016-11-26 MED ORDER — ERYTHROMYCIN 5 MG/GM OP OINT: 1.0000 "application " | TOPICAL_OINTMENT | Freq: Four times a day (QID) | OPHTHALMIC | 0 refills | Status: DC

## 2016-11-26 NOTE — Patient Instructions (Signed)
Bacterial Conjunctivitis Bacterial conjunctivitis is an infection of your conjunctiva. This is the clear membrane that covers the white part of your eye and the inner surface of your eyelid. This condition can make your eye:  Red or pink.  Itchy. This condition is caused by bacteria. This condition spreads very easily from person to person (is contagious) and from one eye to the other eye. Follow these instructions at home: Medicines   Take or apply your antibiotic medicine as told by your doctor. Do not stop taking or applying the antibiotic even if you start to feel better.  Take or apply over-the-counter and prescription medicines only as told by your doctor.  Do not touch your eyelid with the eye drop bottle or the ointment tube. Managing discomfort   Wipe any fluid from your eye with a warm, wet washcloth or a cotton ball.  Place a cool, clean washcloth on your eye. Do this for 10-20 minutes, 3-4 times per day. General instructions   Do not wear contact lenses until the irritation is gone. Wear glasses until your doctor says it is okay to wear contacts.  Do not wear eye makeup until your symptoms are gone. Throw away any old makeup.  Change or wash your pillowcase every day.  Do not share towels or washcloths with anyone.  Wash your hands often with soap and water. Use paper towels to dry your hands.  Do not touch or rub your eyes.  Do not drive or use heavy machinery if your vision is blurry. Contact a doctor if:  You have a fever.  Your symptoms do not get better after 10 days. Get help right away if:  You have a fever and your symptoms suddenly get worse.  You have very bad pain when you move your eye.  Your face:  Hurts.  Is red.  Is swollen.  You have sudden loss of vision. This information is not intended to replace advice given to you by your health care provider. Make sure you discuss any questions you have with your health care provider. Document  Released: 04/17/2008 Document Revised: 12/15/2015 Document Reviewed: 04/21/2015 Elsevier Interactive Patient Education  2017 Elsevier Inc.  

## 2016-11-28 NOTE — Progress Notes (Signed)
BP 112/81   Pulse (!) 133   Temp 99.3 F (37.4 C) (Oral)   Ht 5\' 4"  (1.626 m)   Wt 177 lb 9.6 oz (80.6 kg)   BMI 30.48 kg/m    Subjective:    Patient ID: Alejandra Mcmahon, female    DOB: 1975-11-16, 41 y.o.   MRN: [id number]  HPI: Alejandra Mcmahon is a 41 y.o. female presenting on 11/26/2016 for Cough; Sore Throat; Ear Pain; and Conjunctivitis    Relevant past medical, surgical, family and social history reviewed and updated as indicated. Allergies and medications reviewed and updated.  Past Medical History:  Diagnosis Date  . Allergy   . Depression   . GERD (gastroesophageal reflux disease)     Past Surgical History:  Procedure Laterality Date  . CESAREAN SECTION      Review of Systems  Constitutional: Positive for chills and fatigue. Negative for activity change and appetite change.  HENT: Positive for congestion, postnasal drip and sore throat.   Eyes: Positive for pain, redness and itching.  Respiratory: Positive for cough and wheezing.   Cardiovascular: Negative.  Negative for chest pain, palpitations and leg swelling.  Gastrointestinal: Negative.   Genitourinary: Negative.   Musculoskeletal: Negative.   Skin: Negative.   Neurological: Positive for headaches.    Allergies as of 11/26/2016      Reactions   Amoxicillin    Celexa [citalopram Hydrobromide] Nausea Only   Wellbutrin [bupropion]       Medication List       Accurate as of 11/26/16 11:59 PM. Always use your most recent med list.          azithromycin 250 MG tablet Commonly known as:  ZITHROMAX Z-PAK Take as directed   busPIRone 15 MG tablet Commonly known as:  BUSPAR TAKE 1 TABLET BY MOUTH EVERY DAY   cyclobenzaprine 10 MG tablet Commonly known as:  FLEXERIL TAKE 1 TABLET(10 MG) BY MOUTH THREE TIMES DAILY AS NEEDED FOR MUSCLE SPASMS   erythromycin ophthalmic ointment Commonly known as:  ROMYCIN Place 1 application into both eyes 4 (four) times daily.   escitalopram 20 MG  tablet Commonly known as:  LEXAPRO TAKE 1 TABLET BY MOUTH EVERY DAY   guaiFENesin-codeine 100-10 MG/5ML syrup Commonly known as:  CHERATUSSIN AC Take 5 mLs by mouth 3 (three) times daily as needed for cough.   naproxen 500 MG tablet Commonly known as:  NAPROSYN TAKE 1 TABLET(500 MG) BY MOUTH TWICE DAILY WITH A MEAL   SAFYRAL 3-0.03-0.451 MG tablet Generic drug:  Drospirenone-Ethinyl Estradiol-Levomefol TAKE 1 TABLET BY MOUTH DAILY          Objective:    BP 112/81   Pulse (!) 133   Temp 99.3 F (37.4 C) (Oral)   Ht 5\' 4"  (1.626 m)   Wt 177 lb 9.6 oz (80.6 kg)   BMI 30.48 kg/m   Allergies  Allergen Reactions  . Amoxicillin   . Celexa [Citalopram Hydrobromide] Nausea Only  . Wellbutrin [Bupropion]     Physical Exam  Constitutional: She is oriented to person, place, and time. She appears well-developed and well-nourished.  HENT:  Head: Normocephalic and atraumatic.  Right Ear: Tympanic membrane and external ear normal. No middle ear effusion.  Left Ear: Tympanic membrane and external ear normal.  No middle ear effusion.  Nose: Mucosal edema and rhinorrhea present. Right sinus exhibits no maxillary sinus tenderness. Left sinus exhibits no maxillary sinus tenderness.  Mouth/Throat: Uvula is midline. Posterior oropharyngeal erythema present.  Eyes:  EOM are normal. Pupils are equal, round, and reactive to light. Right eye exhibits discharge. Left eye exhibits no discharge. Right conjunctiva is injected.  Neck: Normal range of motion.  Cardiovascular: Normal rate, regular rhythm and normal heart sounds.   Pulmonary/Chest: Effort normal and breath sounds normal. No respiratory distress. She has no wheezes.  Abdominal: Soft.  Lymphadenopathy:    She has no cervical adenopathy.  Neurological: She is alert and oriented to person, place, and time.  Skin: Skin is warm and dry.  Psychiatric: She has a normal mood and affect.        Assessment & Plan:   1. Acute  non-recurrent frontal sinusitis - azithromycin (ZITHROMAX Z-PAK) 250 MG tablet; Take as directed  Dispense: 6 each; Refill: 0 - guaiFENesin-codeine (CHERATUSSIN AC) 100-10 MG/5ML syrup; Take 5 mLs by mouth 3 (three) times daily as needed for cough.  Dispense: 120 mL; Refill: 0  2. Acute bacterial conjunctivitis of both eyes - erythromycin (ROMYCIN) ophthalmic ointment; Place 1 application into both eyes 4 (four) times daily.  Dispense: 3.5 g; Refill: 0   Current Outpatient Prescriptions:  .  busPIRone (BUSPAR) 15 MG tablet, TAKE 1 TABLET BY MOUTH EVERY DAY, Disp: 30 tablet, Rfl: 0 .  cyclobenzaprine (FLEXERIL) 10 MG tablet, TAKE 1 TABLET(10 MG) BY MOUTH THREE TIMES DAILY AS NEEDED FOR MUSCLE SPASMS, Disp: 30 tablet, Rfl: 0 .  escitalopram (LEXAPRO) 20 MG tablet, TAKE 1 TABLET BY MOUTH EVERY DAY, Disp: 90 tablet, Rfl: 0 .  SAFYRAL 3-0.03-0.451 MG tablet, TAKE 1 TABLET BY MOUTH DAILY, Disp: 84 tablet, Rfl: 0 .  azithromycin (ZITHROMAX Z-PAK) 250 MG tablet, Take as directed, Disp: 6 each, Rfl: 0 .  erythromycin (ROMYCIN) ophthalmic ointment, Place 1 application into both eyes 4 (four) times daily., Disp: 3.5 g, Rfl: 0 .  guaiFENesin-codeine (CHERATUSSIN AC) 100-10 MG/5ML syrup, Take 5 mLs by mouth 3 (three) times daily as needed for cough., Disp: 120 mL, Rfl: 0 .  naproxen (NAPROSYN) 500 MG tablet, TAKE 1 TABLET(500 MG) BY MOUTH TWICE DAILY WITH A MEAL (Patient not taking: Reported on 11/26/2016), Disp: 30 tablet, Rfl: 2  Continue all other maintenance medications as listed above.  Follow up plan: Follow-up as needed or worsening of symptoms. Call office for any issues.   Educational handout given for sinusitis  Remus Loffler PA-C [facility] [address]  [place], [place] [address] [phone]   11/28/2016, 5:38 PM

## 2016-11-29 ENCOUNTER — Other Ambulatory Visit: Payer: Self-pay | Admitting: Family

## 2016-11-29 DIAGNOSIS — F411 Generalized anxiety disorder: Secondary | ICD-10-CM

## 2016-11-29 DIAGNOSIS — F329 Major depressive disorder, single episode, unspecified: Secondary | ICD-10-CM

## 2016-11-29 DIAGNOSIS — F32A Depression, unspecified: Secondary | ICD-10-CM

## 2016-12-03 ENCOUNTER — Other Ambulatory Visit: Payer: Self-pay | Admitting: Family

## 2016-12-03 DIAGNOSIS — Z3041 Encounter for surveillance of contraceptive pills: Secondary | ICD-10-CM

## 2016-12-04 MED ORDER — DROSPIREN-ETH ESTRAD-LEVOMEFOL 3-0.03-0.451 MG PO TABS: 1.0000 | ORAL_TABLET | Freq: Every day | ORAL | 0 refills | Status: DC

## 2017-02-06 ENCOUNTER — Other Ambulatory Visit: Payer: BC Managed Care – PPO | Admitting: Physician Assistant

## 2017-02-11 ENCOUNTER — Other Ambulatory Visit: Payer: BC Managed Care – PPO | Admitting: Physician Assistant

## 2017-02-12 ENCOUNTER — Encounter: Payer: Self-pay | Admitting: Family

## 2017-02-18 ENCOUNTER — Encounter: Payer: Self-pay | Admitting: Physician Assistant

## 2017-02-18 ENCOUNTER — Ambulatory Visit (INDEPENDENT_AMBULATORY_CARE_PROVIDER_SITE_OTHER): Payer: BC Managed Care – PPO | Admitting: Physician Assistant

## 2017-02-18 VITALS — BP 118/78 | HR 100 | Temp 98.8°F | Ht 64.0 in | Wt 173.6 lb

## 2017-02-18 DIAGNOSIS — F411 Generalized anxiety disorder: Secondary | ICD-10-CM

## 2017-02-18 DIAGNOSIS — F3341 Major depressive disorder, recurrent, in partial remission: Secondary | ICD-10-CM

## 2017-02-18 DIAGNOSIS — Z01419 Encounter for gynecological examination (general) (routine) without abnormal findings: Secondary | ICD-10-CM | POA: Insufficient documentation

## 2017-02-18 DIAGNOSIS — Z3041 Encounter for surveillance of contraceptive pills: Secondary | ICD-10-CM

## 2017-02-18 MED ORDER — ESCITALOPRAM OXALATE 20 MG PO TABS: 20.0000 mg | ORAL_TABLET | Freq: Every day | ORAL | 3 refills | Status: DC

## 2017-02-18 MED ORDER — DROSPIREN-ETH ESTRAD-LEVOMEFOL 3-0.03-0.451 MG PO TABS: 1.0000 | ORAL_TABLET | Freq: Every day | ORAL | 3 refills | Status: DC

## 2017-02-18 MED ORDER — CYCLOBENZAPRINE HCL 10 MG PO TABS: 10.0000 mg | ORAL_TABLET | Freq: Three times a day (TID) | ORAL | 0 refills | Status: DC

## 2017-02-18 MED ORDER — BUSPIRONE HCL 30 MG PO TABS: 30.0000 mg | ORAL_TABLET | Freq: Two times a day (BID) | ORAL | 3 refills | Status: DC

## 2017-02-18 NOTE — Progress Notes (Signed)
BP 118/78   Pulse 100   Temp 98.8 F (37.1 C) (Oral)   Ht '5\' 4"'  (1.626 m)   Wt 173 lb 9.6 oz (78.7 kg)   BMI 29.80 kg/m    Subjective:    Patient ID: Alejandra Mcmahon, female    DOB: 1975/12/26, 41 y.o.   MRN: [id number]  HPI: Alejandra Mcmahon is a 41 y.o. female presenting on 02/18/2017 for Annual Exam  This patient comes in for annual well physical examination. All medications are reviewed today. There are no reports of any problems with the medications. All of the medical conditions are reviewed and updated.  Lab work is reviewed and will be ordered as medically necessary.   She feels her anxiety is slightly worse and would like to increase her Buspar dose. This is reasonable, she only takes one 15 mg tab per day.  Relevant past medical, surgical, family and social history reviewed and updated as indicated. Allergies and medications reviewed and updated.  Past Medical History:  Diagnosis Date  . Allergy   . Depression   . GERD (gastroesophageal reflux disease)     Past Surgical History:  Procedure Laterality Date  . CESAREAN SECTION      Review of Systems  Constitutional: Negative.  Negative for activity change, fatigue and fever.  HENT: Negative.   Eyes: Negative.   Respiratory: Negative.  Negative for cough.   Cardiovascular: Negative.  Negative for chest pain.  Gastrointestinal: Negative.  Negative for abdominal pain.  Endocrine: Negative.   Genitourinary: Negative.  Negative for dysuria.  Musculoskeletal: Negative.   Skin: Negative.   Neurological: Negative.     Allergies as of 02/18/2017      Reactions   Amoxicillin    Celexa [citalopram Hydrobromide] Nausea Only   Wellbutrin [bupropion]       Medication List       Accurate as of 02/18/17  1:44 PM. Always use your most recent med list.          busPIRone 30 MG tablet Commonly known as:  BUSPAR Take 1 tablet (30 mg total) by mouth 2 (two) times daily.   cyclobenzaprine 10 MG tablet Commonly known  as:  FLEXERIL Take 1 tablet (10 mg total) by mouth 3 (three) times daily.   Drospirenone-Ethinyl Estradiol-Levomefol 3-0.03-0.451 MG tablet Commonly known as:  SAFYRAL Take 1 tablet by mouth daily.   escitalopram 20 MG tablet Commonly known as:  LEXAPRO Take 1 tablet (20 mg total) by mouth daily.          Objective:    BP 118/78   Pulse 100   Temp 98.8 F (37.1 C) (Oral)   Ht '5\' 4"'  (1.626 m)   Wt 173 lb 9.6 oz (78.7 kg)   BMI 29.80 kg/m   Allergies  Allergen Reactions  . Amoxicillin   . Celexa [Citalopram Hydrobromide] Nausea Only  . Wellbutrin [Bupropion]     Physical Exam  Constitutional: She is oriented to person, place, and time. She appears well-developed and well-nourished.  HENT:  Head: Normocephalic and atraumatic.  Eyes: Pupils are equal, round, and reactive to light. Conjunctivae and EOM are normal.  Neck: Normal range of motion. Neck supple.  Cardiovascular: Normal rate, regular rhythm, normal heart sounds and intact distal pulses.   Pulmonary/Chest: Effort normal and breath sounds normal. Right breast exhibits no mass, no skin change and no tenderness. Left breast exhibits no mass, no skin change and no tenderness. Breasts are symmetrical.  Abdominal: Soft. Bowel sounds are  normal.  Genitourinary: Vagina normal and uterus normal. Rectal exam shows no fissure. No breast swelling, tenderness, discharge or bleeding. There is no tenderness or lesion on the right labia. There is no tenderness or lesion on the left labia. Uterus is not deviated, not enlarged and not tender. Cervix exhibits no motion tenderness, no discharge and no friability. Right adnexum displays no mass, no tenderness and no fullness. Left adnexum displays no mass, no tenderness and no fullness. No tenderness or bleeding in the vagina. No vaginal discharge found.  Neurological: She is alert and oriented to person, place, and time. She has normal reflexes.  Skin: Skin is warm and dry. No rash noted.   Psychiatric: She has a normal mood and affect. Her behavior is normal. Judgment and thought content normal.  Nursing note and vitals reviewed.      Assessment & Plan:   1. Well female exam with routine gynecological exam - CMP14+EGFR - CBC with Differential/Platelet - Lipid panel - TSH - IGP, Aptima HPV, rfx 16/18,45  2. Encounter for surveillance of contraceptive pills - Drospirenone-Ethinyl Estradiol-Levomefol (SAFYRAL) 3-0.03-0.451 MG tablet; Take 1 tablet by mouth daily.  Dispense: 84 tablet; Refill: 3  3. Recurrent major depressive disorder, in partial remission (HCC) - escitalopram (LEXAPRO) 20 MG tablet; Take 1 tablet (20 mg total) by mouth daily.  Dispense: 90 tablet; Refill: 3  4. GAD (generalized anxiety disorder) - busPIRone (BUSPAR) 30 MG tablet; Take 1 tablet (30 mg total) by mouth 2 (two) times daily.  Dispense: 180 tablet; Refill: 3 - escitalopram (LEXAPRO) 20 MG tablet; Take 1 tablet (20 mg total) by mouth daily.  Dispense: 90 tablet; Refill: 3   Current Outpatient Prescriptions:  .  busPIRone (BUSPAR) 30 MG tablet, Take 1 tablet (30 mg total) by mouth 2 (two) times daily., Disp: 180 tablet, Rfl: 3 .  cyclobenzaprine (FLEXERIL) 10 MG tablet, Take 1 tablet (10 mg total) by mouth 3 (three) times daily., Disp: 90 tablet, Rfl: 0 .  Drospirenone-Ethinyl Estradiol-Levomefol (SAFYRAL) 3-0.03-0.451 MG tablet, Take 1 tablet by mouth daily., Disp: 84 tablet, Rfl: 3 .  escitalopram (LEXAPRO) 20 MG tablet, Take 1 tablet (20 mg total) by mouth daily., Disp: 90 tablet, Rfl: 3  Continue all other maintenance medications as listed above.  Follow up plan: Return in about 1 year (around 02/18/2018) for recheck.  Educational handout given for health maintenance  Terald Sleeper PA-C [facility] [address]  [place], [place] [address] [phone]   02/18/2017, 1:44 PM

## 2017-02-18 NOTE — Patient Instructions (Signed)

## 2017-02-19 LAB — CBC WITH DIFFERENTIAL/PLATELET
BASOS ABS: 0 10*3/uL (ref 0.0–0.2)
BASOS: 1 %
EOS (ABSOLUTE): 0.1 10*3/uL (ref 0.0–0.4)
Eos: 2 %
HEMOGLOBIN: 14.1 g/dL (ref 11.1–15.9)
Hematocrit: 42.5 % (ref 34.0–46.6)
IMMATURE GRANS (ABS): 0 10*3/uL (ref 0.0–0.1)
Immature Granulocytes: 0 %
LYMPHS: 26 %
Lymphocytes Absolute: 2 10*3/uL (ref 0.7–3.1)
MCH: 29.8 pg (ref 26.6–33.0)
MCHC: 33.2 g/dL (ref 31.5–35.7)
MCV: 90 fL (ref 79–97)
MONOCYTES: 6 %
Monocytes Absolute: 0.5 10*3/uL (ref 0.1–0.9)
Neutrophils Absolute: 5 10*3/uL (ref 1.4–7.0)
Neutrophils: 65 %
Platelets: 251 10*3/uL (ref 150–379)
RBC: 4.73 x10E6/uL (ref 3.77–5.28)
RDW: 13.2 % (ref 12.3–15.4)
WBC: 7.6 10*3/uL (ref 3.4–10.8)

## 2017-02-19 LAB — LIPID PANEL
CHOLESTEROL TOTAL: 217 mg/dL — AB (ref 100–199)
Chol/HDL Ratio: 3.3 ratio (ref 0.0–4.4)
HDL: 65 mg/dL (ref >39)
Triglycerides: 437 mg/dL — ABNORMAL HIGH (ref 0–149)

## 2017-02-19 LAB — TSH: TSH: 1.83 u[IU]/mL (ref 0.450–4.500)

## 2017-02-19 LAB — CMP14+EGFR
ALBUMIN: 3.8 g/dL (ref 3.5–5.5)
ALT: 8 IU/L (ref 0–32)
AST: 14 IU/L (ref 0–40)
Albumin/Globulin Ratio: 1.4 (ref 1.2–2.2)
Alkaline Phosphatase: 55 IU/L (ref 39–117)
BUN / CREAT RATIO: 18 (ref 9–23)
BUN: 13 mg/dL (ref 6–24)
CO2: 21 mmol/L (ref 20–29)
CREATININE: 0.71 mg/dL (ref 0.57–1.00)
Calcium: 9.1 mg/dL (ref 8.7–10.2)
Chloride: 103 mmol/L (ref 96–106)
GFR calc non Af Amer: 107 mL/min/{1.73_m2} (ref >59)
GFR, EST AFRICAN AMERICAN: 123 mL/min/{1.73_m2} (ref >59)
GLUCOSE: 82 mg/dL (ref 65–99)
Globulin, Total: 2.7 g/dL (ref 1.5–4.5)
Potassium: 4.3 mmol/L (ref 3.5–5.2)
Sodium: 139 mmol/L (ref 134–144)
TOTAL PROTEIN: 6.5 g/dL (ref 6.0–8.5)

## 2017-02-20 LAB — IGP, APTIMA HPV, RFX 16/18,45
HPV Aptima: NEGATIVE
PAP SMEAR COMMENT: 0

## 2017-04-09 ENCOUNTER — Encounter: Payer: Self-pay | Admitting: Family

## 2017-04-09 ENCOUNTER — Ambulatory Visit (INDEPENDENT_AMBULATORY_CARE_PROVIDER_SITE_OTHER): Payer: BC Managed Care – PPO | Admitting: Family

## 2017-04-09 DIAGNOSIS — S8011XA Contusion of right lower leg, initial encounter: Secondary | ICD-10-CM | POA: Diagnosis not present

## 2017-04-09 DIAGNOSIS — S40012A Contusion of left shoulder, initial encounter: Secondary | ICD-10-CM

## 2017-04-09 DIAGNOSIS — R42 Dizziness and giddiness: Secondary | ICD-10-CM

## 2017-04-09 MED ORDER — IBUPROFEN 800 MG PO TABS: 800.0000 mg | ORAL_TABLET | Freq: Three times a day (TID) | ORAL | 0 refills | Status: DC | PRN
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
## 2017-04-09 NOTE — Patient Instructions (Signed)
Contusion A contusion is a deep bruise. Contusions are the result of a blunt injury to tissues and muscle fibers under the skin. The injury causes bleeding under the skin. The skin overlying the contusion may turn blue, purple, or yellow. Minor injuries will give you a painless contusion, but more severe contusions may stay painful and swollen for a few weeks. What are the causes? This condition is usually caused by a blow, trauma, or direct force to an area of the body. What are the signs or symptoms? Symptoms of this condition include:  Swelling of the injured area.  Pain and tenderness in the injured area.  Discoloration. The area may have redness and then turn blue, purple, or yellow. How is this diagnosed? This condition is diagnosed based on a physical exam and medical history. An X-ray, CT scan, or MRI may be needed to determine if there are any associated injuries, such as broken bones (fractures). How is this treated? Specific treatment for this condition depends on what area of the body was injured. In general, the best treatment for a contusion is resting, icing, applying pressure to (compression), and elevating the injured area. This is often called the RICE strategy. Over-the-counter anti-inflammatory medicines may also be recommended for pain control. Follow these instructions at home:  Rest the injured area.  If directed, apply ice to the injured area:  Put ice in a plastic bag.  Place a towel between your skin and the bag.  Leave the ice on for 20 minutes, 2-3 times per day.  If directed, apply light compression to the injured area using an elastic bandage. Make sure the bandage is not wrapped too tightly. Remove and reapply the bandage as directed by your health care provider.  If possible, raise (elevate) the injured area above the level of your heart while you are sitting or lying down.  Take over-the-counter and prescription medicines only as told by your health  care provider. Contact a health care provider if:  Your symptoms do not improve after several days of treatment.  Your symptoms get worse.  You have difficulty moving the injured area. Get help right away if:  You have severe pain.  You have numbness in a hand or foot.  Your hand or foot turns pale or cold. This information is not intended to replace advice given to you by your health care provider. Make sure you discuss any questions you have with your health care provider. Document Released: 04/18/2005 Document Revised: 11/17/2015 Document Reviewed: 11/24/2014 Elsevier Interactive Patient Education  2017 Elsevier Inc.  

## 2017-09-13 ENCOUNTER — Other Ambulatory Visit: Payer: Self-pay | Admitting: Physician Assistant

## 2017-09-17 ENCOUNTER — Other Ambulatory Visit: Payer: Self-pay | Admitting: Family

## 2017-09-18 ENCOUNTER — Other Ambulatory Visit: Payer: Self-pay | Admitting: Physician Assistant

## 2017-09-19 MED ORDER — CYCLOBENZAPRINE HCL 10 MG PO TABS: 10.0000 mg | ORAL_TABLET | Freq: Three times a day (TID) | ORAL | 0 refills | Status: DC

## 2017-11-10 ENCOUNTER — Other Ambulatory Visit: Payer: Self-pay | Admitting: Physician Assistant

## 2017-11-10 DIAGNOSIS — Z3041 Encounter for surveillance of contraceptive pills: Secondary | ICD-10-CM

## 2018-02-06 ENCOUNTER — Other Ambulatory Visit: Payer: Self-pay | Admitting: Physician Assistant

## 2018-02-06 DIAGNOSIS — F411 Generalized anxiety disorder: Secondary | ICD-10-CM

## 2018-02-06 DIAGNOSIS — F3341 Major depressive disorder, recurrent, in partial remission: Secondary | ICD-10-CM

## 2018-02-07 NOTE — Telephone Encounter (Signed)
Last seen 04/09/17   [facility]

## 2018-02-07 NOTE — Telephone Encounter (Signed)
Patient NTBS for follow up and lab work  

## 2018-04-02 ENCOUNTER — Other Ambulatory Visit: Payer: Self-pay | Admitting: Physician Assistant

## 2018-04-02 DIAGNOSIS — Z1231 Encounter for screening mammogram for malignant neoplasm of breast: Secondary | ICD-10-CM

## 2018-04-18 ENCOUNTER — Ambulatory Visit
Admission: RE | Admit: 2018-04-18 | Discharge: 2018-04-18 | Disposition: A | Discharge status: Home / Self Care | Payer: BC Managed Care – PPO | Source: Ambulatory Visit

## 2018-04-18 DIAGNOSIS — Z1231 Encounter for screening mammogram for malignant neoplasm of breast: Secondary | ICD-10-CM

## 2018-04-21 ENCOUNTER — Telehealth: Payer: BC Managed Care – PPO | Admitting: Family

## 2018-04-21 DIAGNOSIS — L738 Other specified follicular disorders: Secondary | ICD-10-CM

## 2018-04-21 MED ORDER — DOXYCYCLINE HYCLATE 100 MG PO TABS: 100.0000 mg | ORAL_TABLET | Freq: Two times a day (BID) | ORAL | 0 refills | Status: DC

## 2018-04-21 NOTE — Progress Notes (Signed)
E Visit for Rash  We are sorry that you are not feeling well. Here is how we plan to help!  Doxycycline 100 mg twice per day for 7 days   HOME CARE:   Take cool showers and avoid direct sunlight.  Apply cool compress or wet dressings.  Take a bath in an oatmeal bath.  Sprinkle content of one Aveeno packet under running faucet with comfortably warm water.  Bathe for 15-20 minutes, 1-2 times daily.  Pat dry with a towel. Do not rub the rash.  Use hydrocortisone cream.  Take an antihistamine like Benadryl for widespread rashes that itch.  The adult dose of Benadryl is 25-50 mg by mouth 4 times daily.  Caution:  This type of medication may cause sleepiness.  Do not drink alcohol, drive, or operate dangerous machinery while taking antihistamines.  Do not take these medications if you have prostate enlargement.  Read package instructions thoroughly on all medications that you take.  GET HELP RIGHT AWAY IF:   Symptoms don't go away after treatment.  Severe itching that persists.  If you rash spreads or swells.  If you rash begins to smell.  If it blisters and opens or develops a yellow-brown crust.  You develop a fever.  You have a sore throat.  You become short of breath.  MAKE SURE YOU:  Understand these instructions. Will watch your condition. Will get help right away if you are not doing well or get worse.  Thank you for choosing an e-visit. Your e-visit answers were reviewed by a board certified advanced clinical practitioner to complete your personal care plan. Depending upon the condition, your plan could have included both over the counter or prescription medications. Please review your pharmacy choice. Be sure that the pharmacy you have chosen is open so that you can pick up your prescription now.  If there is a problem you may message your provider in MyChart to have the prescription routed to another pharmacy. Your safety is important to us. If you have drug  allergies check your prescription carefully.  For the next 24 hours, you can use MyChart to ask questions about today's visit, request a non-urgent call back, or ask for a work or school excuse from your e-visit provider. You will get an email in the next two days asking about your experience. I hope that your e-visit has been valuable and will speed your recovery.      

## 2018-05-05 ENCOUNTER — Telehealth: Payer: BC Managed Care – PPO | Admitting: Family

## 2018-05-05 DIAGNOSIS — R05 Cough: Secondary | ICD-10-CM

## 2018-05-05 DIAGNOSIS — R059 Cough, unspecified: Secondary | ICD-10-CM

## 2018-05-05 DIAGNOSIS — J208 Acute bronchitis due to other specified organisms: Secondary | ICD-10-CM

## 2018-05-05 MED ORDER — BENZONATATE 100 MG PO CAPS: 100.0000 mg | ORAL_CAPSULE | Freq: Three times a day (TID) | ORAL | 0 refills | Status: DC | PRN

## 2018-05-05 NOTE — Progress Notes (Signed)
We are sorry that you are not feeling well.  Here is how we plan to help!  Based on your presentation I believe you most likely have A cough due to a virus.  This is called viral bronchitis and is best treated by rest, plenty of fluids and control of the cough.  You may use Ibuprofen or Tylenol as directed to help your symptoms.     In addition you may use A non-prescription cough medication called Robitussin DAC. Take 2 teaspoons every 8 hours or Delsym: take 2 teaspoons every 12 hours., A non-prescription cough medication called Mucinex DM: take 2 tablets every 12 hours. and A prescription cough medication called Tessalon Perles 100mg . You may take 1-2 capsules every 8 hours as needed for your cough. Through an Evisit, we cannot send in codeine. Sorry.      From your responses in the eVisit questionnaire you describe inflammation in the upper respiratory tract which is causing a significant cough.  This is commonly called Bronchitis and has four common causes:    Allergies  Viral Infections  Acid Reflux  Bacterial Infection Allergies, viruses and acid reflux are treated by controlling symptoms or eliminating the cause. An example might be a cough caused by taking certain blood pressure medications. You stop the cough by changing the medication. Another example might be a cough caused by acid reflux. Controlling the reflux helps control the cough.  USE OF BRONCHODILATOR ("RESCUE") INHALERS: There is a risk from using your bronchodilator too frequently.  The risk is that over-reliance on a medication which only relaxes the muscles surrounding the breathing tubes can reduce the effectiveness of medications prescribed to reduce swelling and congestion of the tubes themselves.  Although you feel brief relief from the bronchodilator inhaler, your asthma may actually be worsening with the tubes becoming more swollen and filled with mucus.  This can delay other crucial treatments, such as oral steroid  medications. If you need to use a bronchodilator inhaler daily, several times per day, you should discuss this with your provider.  There are probably better treatments that could be used to keep your asthma under control.     HOME CARE . Only take medications as instructed by your medical team. . Complete the entire course of an antibiotic. . Drink plenty of fluids and get plenty of rest. . Avoid close contacts especially the very young and the elderly . Cover your mouth if you cough or cough into your sleeve. . Always remember to wash your hands . A steam or ultrasonic humidifier can help congestion.   GET HELP RIGHT AWAY IF: . You develop worsening fever. . You become short of breath . You cough up blood. . Your symptoms persist after you have completed your treatment plan MAKE SURE YOU   Understand these instructions.  Will watch your condition.  Will get help right away if you are not doing well or get worse.  Your e-visit answers were reviewed by a board certified advanced clinical practitioner to complete your personal care plan.  Depending on the condition, your plan could have included both over the counter or prescription medications. If there is a problem please reply  once you have received a response from your provider. Your safety is important to [place].  If you have drug allergies check your prescription carefully.    You can use MyChart to ask questions about today's visit, request a non-urgent call back, or ask for a work or school excuse for 24  for 24 hours related to this e-Visit. If it has been greater than 24 hours you will need to follow up with your provider, or enter a new e-Visit to address those concerns. You will get an e-mail in the next two days asking about your experience.  I hope that your e-visit has been valuable and will speed your recovery. Thank you for using e-visits.   

## 2018-05-08 ENCOUNTER — Other Ambulatory Visit: Payer: Self-pay | Admitting: Physician Assistant

## 2018-05-08 ENCOUNTER — Other Ambulatory Visit: Payer: Self-pay | Admitting: Family

## 2018-05-08 DIAGNOSIS — F3341 Major depressive disorder, recurrent, in partial remission: Secondary | ICD-10-CM

## 2018-05-08 DIAGNOSIS — F411 Generalized anxiety disorder: Secondary | ICD-10-CM

## 2018-05-08 DIAGNOSIS — Z3041 Encounter for surveillance of contraceptive pills: Secondary | ICD-10-CM

## 2018-05-09 NOTE — Telephone Encounter (Signed)
lmtcb- cb 10/18  

## 2018-05-09 NOTE — Telephone Encounter (Signed)
[facility]. NTBS. Last OV 04/09/17

## 2018-05-12 NOTE — Telephone Encounter (Signed)
No return call.  Note filed. 

## 2018-05-27 ENCOUNTER — Encounter: Payer: Self-pay | Admitting: Family

## 2018-05-27 ENCOUNTER — Ambulatory Visit: Payer: BC Managed Care – PPO | Admitting: Family

## 2018-05-27 VITALS — BP 130/77 | HR 105 | Temp 98.5°F | Ht 64.0 in | Wt 180.2 lb

## 2018-05-27 DIAGNOSIS — E669 Obesity, unspecified: Secondary | ICD-10-CM | POA: Insufficient documentation

## 2018-05-27 DIAGNOSIS — Z0000 Encounter for general adult medical examination without abnormal findings: Secondary | ICD-10-CM

## 2018-05-27 DIAGNOSIS — Z3041 Encounter for surveillance of contraceptive pills: Secondary | ICD-10-CM | POA: Diagnosis not present

## 2018-05-27 DIAGNOSIS — K219 Gastro-esophageal reflux disease without esophagitis: Secondary | ICD-10-CM

## 2018-05-27 DIAGNOSIS — F3341 Major depressive disorder, recurrent, in partial remission: Secondary | ICD-10-CM

## 2018-05-27 DIAGNOSIS — G8929 Other chronic pain: Secondary | ICD-10-CM

## 2018-05-27 DIAGNOSIS — Z23 Encounter for immunization: Secondary | ICD-10-CM

## 2018-05-27 DIAGNOSIS — E663 Overweight: Secondary | ICD-10-CM | POA: Insufficient documentation

## 2018-05-27 DIAGNOSIS — F321 Major depressive disorder, single episode, moderate: Secondary | ICD-10-CM

## 2018-05-27 DIAGNOSIS — M545 Low back pain: Secondary | ICD-10-CM

## 2018-05-27 DIAGNOSIS — F411 Generalized anxiety disorder: Secondary | ICD-10-CM

## 2018-05-27 MED ORDER — BUSPIRONE HCL 30 MG PO TABS: 30.0000 mg | ORAL_TABLET | Freq: Two times a day (BID) | ORAL | 3 refills | Status: DC

## 2018-05-27 MED ORDER — ESCITALOPRAM OXALATE 20 MG PO TABS: ORAL_TABLET | ORAL | 3 refills | Status: DC

## 2018-05-27 MED ORDER — CYCLOBENZAPRINE HCL 10 MG PO TABS: 10.0000 mg | ORAL_TABLET | Freq: Three times a day (TID) | ORAL | 2 refills | Status: DC

## 2018-05-27 MED ORDER — DROSPIREN-ETH ESTRAD-LEVOMEFOL 3-0.03-0.451 MG PO TABS: 1.0000 | ORAL_TABLET | Freq: Every day | ORAL | 3 refills | Status: DC

## 2018-05-27 NOTE — Progress Notes (Signed)
Subjective:    Patient ID: Alejandra Mcmahon, female    DOB: [birth year], 42 y.o.   MRN: [id number]  Chief Complaint  Patient presents with  . Medical Management of Chronic Issues  . Annual Exam   PT presents to the office today CPE without pap.  Depression         This is a chronic problem.  The current episode started more than 1 year ago.   The onset quality is gradual.   The problem occurs intermittently.  The problem has been waxing and waning since onset.  Associated symptoms include decreased concentration, irritable, restlessness and decreased interest.  Associated symptoms include no helplessness, no hopelessness, does not have insomnia and not sad.     The symptoms are aggravated by family issues.  Past treatments include SSRIs - Selective serotonin reuptake inhibitors.  Past medical history includes anxiety.   Anxiety  Presents for follow-up visit. Symptoms include decreased concentration, excessive worry, irritability, nervous/anxious behavior and restlessness. Patient reports no insomnia or panic. Symptoms occur occasionally. The severity of symptoms is moderate.    Gastroesophageal Reflux  She complains of belching and heartburn. This is a chronic problem. The current episode started more than 1 year ago. The problem occurs occasionally. The symptoms are aggravated by certain foods. She has tried an antacid for the symptoms. The treatment provided moderate relief.  Back Pain  This is a chronic problem. The current episode started more than 1 year ago. The problem occurs intermittently. The problem has been waxing and waning since onset. The pain is present in the lumbar spine. The pain is at a severity of 5/10. The pain is mild.  OC Pt currently taking OC without complaints. Denies any tobacco use or hx DVT.    Review of Systems  Constitutional: Positive for irritability.  Gastrointestinal: Positive for heartburn.  Musculoskeletal: Positive for back pain.    Psychiatric/Behavioral: Positive for decreased concentration and depression. The patient is nervous/anxious. The patient does not have insomnia.   All other systems reviewed and are negative.      Objective:   Physical Exam  Constitutional: She is oriented to person, place, and time. She appears well-developed and well-nourished. She is irritable. No distress.  HENT:  Head: Normocephalic and atraumatic.  Right Ear: External ear normal.  Left Ear: External ear normal.  Mouth/Throat: Oropharynx is clear and moist.  Eyes: Pupils are equal, round, and reactive to light.  Neck: Normal range of motion. Neck supple. No thyromegaly present.  Cardiovascular: Normal rate, regular rhythm, normal heart sounds and intact distal pulses.  No murmur heard. Pulmonary/Chest: Effort normal and breath sounds normal. No respiratory distress. She has no wheezes.  Abdominal: Soft. Bowel sounds are normal. She exhibits no distension. There is no tenderness.  Musculoskeletal: Normal range of motion. She exhibits no edema or tenderness.  Neurological: She is alert and oriented to person, place, and time. She has normal reflexes. No cranial nerve deficit.  Skin: Skin is warm and dry.  Psychiatric: She has a normal mood and affect. Her behavior is normal. Judgment and thought content normal.  Vitals reviewed.     BP 130/77   Pulse (!) 105   Temp 98.5 F (36.9 C) (Oral)   Ht '5\' 4"'  (1.626 m)   Wt 180 lb 3.2 oz (81.7 kg)   BMI 30.93 kg/m      Assessment & Plan:  Alejandra Mcmahon comes in today with chief complaint of Medical Management of Chronic Issues  and Annual Exam   Diagnosis and orders addressed:  1. Annual physical exam - CMP14+EGFR - CBC with Differential/Platelet - Lipid panel - TSH  2. Gastroesophageal reflux disease, esophagitis presence not specified - CMP14+EGFR - CBC with Differential/Platelet  3. GAD (generalized anxiety disorder) - CMP14+EGFR - CBC with  Differential/Platelet - busPIRone (BUSPAR) 30 MG tablet; Take 1 tablet (30 mg total) by mouth 2 (two) times daily.  Dispense: 180 tablet; Refill: 3 - escitalopram (LEXAPRO) 20 MG tablet; TAKE 1 TABLET(20 MG) BY MOUTH DAILY  Dispense: 90 tablet; Refill: 3  4. Current moderate episode of major depressive disorder, unspecified whether recurrent (HCC) - CMP14+EGFR - CBC with Differential/Platelet  5. Obesity (BMI 30-39.9) - CMP14+EGFR - CBC with Differential/Platelet  6. Encounter for surveillance of contraceptive pills - CMP14+EGFR - CBC with Differential/Platelet - Drospirenone-Ethinyl Estradiol-Levomefol (SAFYRAL) 3-0.03-0.451 MG tablet; Take 1 tablet by mouth daily.  Dispense: 84 tablet; Refill: 3  7. Recurrent major depressive disorder, in partial remission (HCC) - escitalopram (LEXAPRO) 20 MG tablet; TAKE 1 TABLET(20 MG) BY MOUTH DAILY  Dispense: 90 tablet; Refill: 3  8. Chronic bilateral low back pain without sciatica - cyclobenzaprine (FLEXERIL) 10 MG tablet; Take 1 tablet (10 mg total) by mouth 3 (three) times daily.  Dispense: 90 tablet; Refill: 2   Labs pending Health Maintenance reviewed Diet and exercise encouraged  Follow up plan: 1 year    Evelina Dun, FNP

## 2018-05-27 NOTE — Patient Instructions (Signed)

## 2018-05-28 LAB — CMP14+EGFR
ALBUMIN: 3.8 g/dL (ref 3.5–5.5)
ALT: 13 IU/L (ref 0–32)
AST: 16 IU/L (ref 0–40)
Albumin/Globulin Ratio: 1.5 (ref 1.2–2.2)
Alkaline Phosphatase: 64 IU/L (ref 39–117)
BILIRUBIN TOTAL: 0.2 mg/dL (ref 0.0–1.2)
BUN/Creatinine Ratio: 15 (ref 9–23)
BUN: 9 mg/dL (ref 6–24)
CALCIUM: 9.4 mg/dL (ref 8.7–10.2)
CHLORIDE: 100 mmol/L (ref 96–106)
CO2: 22 mmol/L (ref 20–29)
CREATININE: 0.61 mg/dL (ref 0.57–1.00)
GFR calc Af Amer: 130 mL/min/{1.73_m2} (ref >59)
GFR calc non Af Amer: 113 mL/min/{1.73_m2} (ref >59)
GLUCOSE: 83 mg/dL (ref 65–99)
Globulin, Total: 2.5 g/dL (ref 1.5–4.5)
Potassium: 4 mmol/L (ref 3.5–5.2)
Sodium: 141 mmol/L (ref 134–144)
TOTAL PROTEIN: 6.3 g/dL (ref 6.0–8.5)

## 2018-05-28 LAB — CBC WITH DIFFERENTIAL/PLATELET
BASOS ABS: 0.1 10*3/uL (ref 0.0–0.2)
Basos: 1 %
EOS (ABSOLUTE): 0.2 10*3/uL (ref 0.0–0.4)
Eos: 2 %
Hematocrit: 41.1 % (ref 34.0–46.6)
Hemoglobin: 13.2 g/dL (ref 11.1–15.9)
Immature Grans (Abs): 0 10*3/uL (ref 0.0–0.1)
Immature Granulocytes: 0 %
LYMPHS ABS: 2.4 10*3/uL (ref 0.7–3.1)
Lymphs: 31 %
MCH: 28.7 pg (ref 26.6–33.0)
MCHC: 32.1 g/dL (ref 31.5–35.7)
MCV: 89 fL (ref 79–97)
MONOS ABS: 0.5 10*3/uL (ref 0.1–0.9)
Monocytes: 7 %
NEUTROS ABS: 4.6 10*3/uL (ref 1.4–7.0)
Neutrophils: 59 %
PLATELETS: 309 10*3/uL (ref 150–450)
RBC: 4.6 x10E6/uL (ref 3.77–5.28)
RDW: 12.1 % — ABNORMAL LOW (ref 12.3–15.4)
WBC: 7.8 10*3/uL (ref 3.4–10.8)

## 2018-05-28 LAB — TSH: TSH: 0.977 u[IU]/mL (ref 0.450–4.500)

## 2018-05-28 LAB — LIPID PANEL
CHOLESTEROL TOTAL: 289 mg/dL — AB (ref 100–199)
Chol/HDL Ratio: 4.7 ratio — ABNORMAL HIGH (ref 0.0–4.4)
HDL: 61 mg/dL (ref >39)
LDL CALC: 173 mg/dL — AB (ref 0–99)
TRIGLYCERIDES: 277 mg/dL — AB (ref 0–149)
VLDL CHOLESTEROL CAL: 55 mg/dL — AB (ref 5–40)

## 2018-05-29 ENCOUNTER — Other Ambulatory Visit: Payer: Self-pay | Admitting: Family

## 2018-05-29 MED ORDER — ATORVASTATIN CALCIUM 20 MG PO TABS: 20.0000 mg | ORAL_TABLET | Freq: Every day | ORAL | 3 refills | Status: DC

## 2018-06-06 ENCOUNTER — Encounter: Payer: Self-pay | Admitting: *Deleted

## 2018-09-21 ENCOUNTER — Other Ambulatory Visit: Payer: Self-pay | Admitting: Family

## 2018-09-21 DIAGNOSIS — Z3041 Encounter for surveillance of contraceptive pills: Secondary | ICD-10-CM

## 2018-12-15 ENCOUNTER — Other Ambulatory Visit: Payer: Self-pay | Admitting: Family

## 2018-12-15 DIAGNOSIS — Z3041 Encounter for surveillance of contraceptive pills: Secondary | ICD-10-CM

## 2019-02-23 ENCOUNTER — Telehealth: Payer: BC Managed Care – PPO | Admitting: Physician Assistant

## 2019-02-23 ENCOUNTER — Encounter: Payer: Self-pay | Admitting: Physician Assistant

## 2019-02-23 DIAGNOSIS — L309 Dermatitis, unspecified: Secondary | ICD-10-CM | POA: Diagnosis not present

## 2019-02-23 MED ORDER — TRIAMCINOLONE ACETONIDE 0.1 % EX CREA: 1.0000 "application " | TOPICAL_CREAM | Freq: Two times a day (BID) | CUTANEOUS | 0 refills | Status: DC

## 2019-02-23 NOTE — Progress Notes (Signed)
E Visit for Rash  We are sorry that you are not feeling well. Here is how we plan to help!  Based on what you shared with me it looks like you have contact dermatitis.  Contact dermatitis is a skin rash caused by something that touches the skin and causes irritation or inflammation.  Your skin may be red, swollen, dry, cracked, and itch.  The rash should go away in a few days but can last a few weeks.  If you get a rash, it's important to figure out what caused it so the irritant can be avoided in the future.   You have stated the rash is typical flare of your eczema.  I have prescribed Triamcinolone 0.1% apply thin layer twice daily over rash. Apply sparingly and avoid overuse of the cream to avoid skin thinning and damage.     HOME CARE:   Take cool showers and avoid direct sunlight.  Apply cool compress or wet dressings.  Take a bath in an oatmeal bath.  Sprinkle content of one Aveeno packet under running faucet with comfortably warm water.  Bathe for 15-20 minutes, 1-2 times daily.  Pat dry with a towel. Do not rub the rash.  Use hydrocortisone cream.  Take an antihistamine like Benadryl for widespread rashes that itch.  The adult dose of Benadryl is 25-50 mg by mouth 4 times daily.  Caution:  This type of medication may cause sleepiness.  Do not drink alcohol, drive, or operate dangerous machinery while taking antihistamines.  Do not take these medications if you have prostate enlargement.  Read package instructions thoroughly on all medications that you take.  GET HELP RIGHT AWAY IF:   Symptoms don't go away after treatment.  Severe itching that persists.  If you rash spreads or swells.  If you rash begins to smell.  If it blisters and opens or develops a yellow-brown crust.  You develop a fever.  You have a sore throat.  You become short of breath.  MAKE SURE YOU:  Understand these instructions. Will watch your condition. Will get help right away if you are  not doing well or get worse.  Thank you for choosing an e-visit. Your e-visit answers were reviewed by a board certified advanced clinical practitioner to complete your personal care plan. Depending upon the condition, your plan could have included both over the counter or prescription medications. Please review your pharmacy choice. Be sure that the pharmacy you have chosen is open so that you can pick up your prescription now.  If there is a problem you may message your provider in [place] to have the prescription routed to another pharmacy. Your safety is important to [place]. If you have drug allergies check your prescription carefully.  For the next 24 hours, you can use MyChart to ask questions about today's visit, request a non-urgent call back, or ask for a work or school excuse from your e-visit provider. You will get an email in the next two days asking about your experience. I hope that your e-visit has been valuable and will speed your recovery.     I spent 5-10 minutes on review and completion of this note- Lacy Du[facility]

## 2019-04-23 ENCOUNTER — Other Ambulatory Visit: Payer: Self-pay | Admitting: Family

## 2019-04-23 DIAGNOSIS — Z1231 Encounter for screening mammogram for malignant neoplasm of breast: Secondary | ICD-10-CM

## 2019-05-05 ENCOUNTER — Other Ambulatory Visit: Payer: Self-pay | Admitting: *Deleted

## 2019-05-05 DIAGNOSIS — F411 Generalized anxiety disorder: Secondary | ICD-10-CM

## 2019-05-05 DIAGNOSIS — F3341 Major depressive disorder, recurrent, in partial remission: Secondary | ICD-10-CM

## 2019-05-05 NOTE — Telephone Encounter (Signed)
[facility] NTBS LOV 05/27/18. RF not sent mail order pharmacy

## 2019-05-05 NOTE — Telephone Encounter (Signed)
Please call our office to schedule an appointment with your provider to continue receiving medication refills.

## 2019-05-05 NOTE — Addendum Note (Signed)
Addended by: Antonietta Barcelona D on: 05/05/2019 11:49 AM   Modules accepted: Orders

## 2019-05-11 ENCOUNTER — Other Ambulatory Visit: Payer: Self-pay | Admitting: Family

## 2019-05-11 DIAGNOSIS — Z1231 Encounter for screening mammogram for malignant neoplasm of breast: Secondary | ICD-10-CM

## 2019-05-22 ENCOUNTER — Ambulatory Visit: Payer: BC Managed Care – PPO | Admitting: Family

## 2019-05-22 ENCOUNTER — Other Ambulatory Visit: Payer: Self-pay

## 2019-05-25 ENCOUNTER — Encounter: Payer: Self-pay | Admitting: Family

## 2019-05-25 ENCOUNTER — Ambulatory Visit: Payer: BC Managed Care – PPO | Admitting: Family

## 2019-05-25 ENCOUNTER — Other Ambulatory Visit: Payer: Self-pay

## 2019-05-25 VITALS — BP 121/84 | HR 98 | Temp 98.4°F | Ht 64.0 in | Wt 180.4 lb

## 2019-05-25 DIAGNOSIS — K219 Gastro-esophageal reflux disease without esophagitis: Secondary | ICD-10-CM

## 2019-05-25 DIAGNOSIS — F3341 Major depressive disorder, recurrent, in partial remission: Secondary | ICD-10-CM

## 2019-05-25 DIAGNOSIS — M545 Low back pain: Secondary | ICD-10-CM

## 2019-05-25 DIAGNOSIS — E669 Obesity, unspecified: Secondary | ICD-10-CM | POA: Diagnosis not present

## 2019-05-25 DIAGNOSIS — F321 Major depressive disorder, single episode, moderate: Secondary | ICD-10-CM | POA: Diagnosis not present

## 2019-05-25 DIAGNOSIS — Z0001 Encounter for general adult medical examination with abnormal findings: Secondary | ICD-10-CM

## 2019-05-25 DIAGNOSIS — F411 Generalized anxiety disorder: Secondary | ICD-10-CM

## 2019-05-25 DIAGNOSIS — E785 Hyperlipidemia, unspecified: Secondary | ICD-10-CM

## 2019-05-25 DIAGNOSIS — Z3041 Encounter for surveillance of contraceptive pills: Secondary | ICD-10-CM

## 2019-05-25 DIAGNOSIS — G8929 Other chronic pain: Secondary | ICD-10-CM

## 2019-05-25 DIAGNOSIS — Z0000 Encounter for general adult medical examination without abnormal findings: Secondary | ICD-10-CM

## 2019-05-25 MED ORDER — ESCITALOPRAM OXALATE 20 MG PO TABS: ORAL_TABLET | ORAL | 3 refills | Status: DC

## 2019-05-25 MED ORDER — CYCLOBENZAPRINE HCL 10 MG PO TABS: 10.0000 mg | ORAL_TABLET | Freq: Three times a day (TID) | ORAL | 2 refills | Status: DC

## 2019-05-25 MED ORDER — ATORVASTATIN CALCIUM 20 MG PO TABS: 20.0000 mg | ORAL_TABLET | Freq: Every day | ORAL | 3 refills | Status: DC

## 2019-05-25 MED ORDER — BUSPIRONE HCL 30 MG PO TABS: 30.0000 mg | ORAL_TABLET | Freq: Two times a day (BID) | ORAL | 3 refills | Status: DC

## 2019-05-25 MED ORDER — DROSPIREN-ETH ESTRAD-LEVOMEFOL 3-0.03-0.451 MG PO TABS: 1.0000 | ORAL_TABLET | Freq: Every day | ORAL | 1 refills | Status: DC

## 2019-05-25 NOTE — Patient Instructions (Signed)
Health Maintenance, Female Adopting a healthy lifestyle and getting preventive care are important in promoting health and wellness. Ask your health care provider about:  The right schedule for you to have regular tests and exams.  Things you can do on your own to prevent diseases and keep yourself healthy. What should I know about diet, weight, and exercise? Eat a healthy diet   Eat a diet that includes plenty of vegetables, fruits, low-fat dairy products, and lean protein.  Do not eat a lot of foods that are high in solid fats, added sugars, or sodium. Maintain a healthy weight Body mass index (BMI) is used to identify weight problems. It estimates body fat based on height and weight. Your health care provider can help determine your BMI and help you achieve or maintain a healthy weight. Get regular exercise Get regular exercise. This is one of the most important things you can do for your health. Most adults should:  Exercise for at least 150 minutes each week. The exercise should increase your heart rate and make you sweat (moderate-intensity exercise).  Do strengthening exercises at least twice a week. This is in addition to the moderate-intensity exercise.  Spend less time sitting. Even light physical activity can be beneficial. Watch cholesterol and blood lipids Have your blood tested for lipids and cholesterol at 42 years of age, then have this test every 5 years. Have your cholesterol levels checked more often if:  Your lipid or cholesterol levels are high.  You are older than 42 years of age.  You are at high risk for heart disease. What should I know about cancer screening? Depending on your health history and family history, you may need to have cancer screening at various ages. This may include screening for:  Breast cancer.  Cervical cancer.  Colorectal cancer.  Skin cancer.  Lung cancer. What should I know about heart disease, diabetes, and high blood  pressure? Blood pressure and heart disease  High blood pressure causes heart disease and increases the risk of stroke. This is more likely to develop in people who have high blood pressure readings, are of African descent, or are overweight.  Have your blood pressure checked: ? Every 3-5 years if you are 18-39 years of age. ? Every year if you are 40 years old or older. Diabetes Have regular diabetes screenings. This checks your fasting blood sugar level. Have the screening done:  Once every three years after age 40 if you are at a normal weight and have a low risk for diabetes.  More often and at a younger age if you are overweight or have a high risk for diabetes. What should I know about preventing infection? Hepatitis B If you have a higher risk for hepatitis B, you should be screened for this virus. Talk with your health care provider to find out if you are at risk for hepatitis B infection. Hepatitis C Testing is recommended for:  Everyone born from 1945 through 1965.  Anyone with known risk factors for hepatitis C. Sexually transmitted infections (STIs)  Get screened for STIs, including gonorrhea and chlamydia, if: ? You are sexually active and are younger than 42 years of age. ? You are older than 42 years of age and your health care provider tells you that you are at risk for this type of infection. ? Your sexual activity has changed since you were last screened, and you are at increased risk for chlamydia or gonorrhea. Ask your health care provider if   you are at risk.  Ask your health care provider about whether you are at high risk for HIV. Your health care provider may recommend a prescription medicine to help prevent HIV infection. If you choose to take medicine to prevent HIV, you should first get tested for HIV. You should then be tested every 3 months for as long as you are taking the medicine. Pregnancy  If you are about to stop having your period (premenopausal) and  you may become pregnant, seek counseling before you get pregnant.  Take 400 to 800 micrograms (mcg) of folic acid every day if you become pregnant.  Ask for birth control (contraception) if you want to prevent pregnancy. Osteoporosis and menopause Osteoporosis is a disease in which the bones lose minerals and strength with aging. This can result in bone fractures. If you are 65 years old or older, or if you are at risk for osteoporosis and fractures, ask your health care provider if you should:  Be screened for bone loss.  Take a calcium or vitamin D supplement to lower your risk of fractures.  Be given hormone replacement therapy (HRT) to treat symptoms of menopause. Follow these instructions at home: Lifestyle  Do not use any products that contain nicotine or tobacco, such as cigarettes, e-cigarettes, and chewing tobacco. If you need help quitting, ask your health care provider.  Do not use street drugs.  Do not share needles.  Ask your health care provider for help if you need support or information about quitting drugs. Alcohol use  Do not drink alcohol if: ? Your health care provider tells you not to drink. ? You are pregnant, may be pregnant, or are planning to become pregnant.  If you drink alcohol: ? Limit how much you use to 0-1 drink a day. ? Limit intake if you are breastfeeding.  Be aware of how much alcohol is in your drink. In the [place] one drink equals one 12 oz bottle of beer (355 mL), one 5 oz glass of wine (148 mL), or one 1 oz glass of hard liquor (44 mL). General instructions  Schedule regular health, dental, and eye exams.  Stay current with your vaccines.  Tell your health care provider if: ? You often feel depressed. ? You have ever been abused or do not feel safe at home. Summary  Adopting a healthy lifestyle and getting preventive care are important in promoting health and wellness.  Follow your health care provider's instructions about healthy  diet, exercising, and getting tested or screened for diseases.  Follow your health care provider's instructions on monitoring your cholesterol and blood pressure. This information is not intended to replace advice given to you by your health care provider. Make sure you discuss any questions you have with your health care provider. Document Released: 01/22/2011 Document Revised: 07/02/2018 Document Reviewed: 07/02/2018 [employer]vier Patient Education  2020 [employer]  

## 2019-05-25 NOTE — Progress Notes (Signed)
Subjective:    Patient ID: Alejandra Mcmahon, female    DOB: April 05, 1976, 43 y.o.   MRN: [id number]  Chief Complaint  Patient presents with  . Medical Management of Chronic Issues   PT presents to the office today CPE without pap Depression        This is a chronic problem.  The current episode started more than 1 year ago.   The onset quality is gradual.   The problem occurs intermittently.  The problem has been waxing and waning since onset.  Associated symptoms include decreased concentration, helplessness, hopelessness, irritable, restlessness, decreased interest and sad.  Past treatments include SNRIs - Serotonin and norepinephrine reuptake inhibitors.  Compliance with treatment is good.  Past medical history includes anxiety.   Anxiety Presents for follow-up visit. Symptoms include decreased concentration, depressed mood, excessive worry, irritability and restlessness. Symptoms occur occasionally. The severity of symptoms is moderate.    Gastroesophageal Reflux She complains of coughing and heartburn. This is a chronic problem. The current episode started more than 1 year ago. The problem occurs occasionally. The problem has been waxing and waning. The symptoms are aggravated by certain foods. Risk factors include obesity. She has tried an antacid for the symptoms. The treatment provided mild relief.  Hyperlipidemia This is a chronic problem. The current episode started more than 1 year ago. The problem is controlled. Recent lipid tests were reviewed and are normal. Current antihyperlipidemic treatment includes statins. The current treatment provides moderate improvement of lipids. Risk factors for coronary artery disease include a sedentary lifestyle and dyslipidemia.  Back Pain This is a chronic problem. The current episode started more than 1 year ago. The problem occurs intermittently. The problem has been waxing and waning since onset. The pain is present in the thoracic spine. The  quality of the pain is described as aching. The pain is at a severity of 7/10. The pain is moderate. She has tried bed rest and muscle relaxant for the symptoms. The treatment provided mild relief.      Review of Systems  Constitutional: Positive for irritability.  Respiratory: Positive for cough.   Gastrointestinal: Positive for heartburn.  Musculoskeletal: Positive for back pain.  Psychiatric/Behavioral: Positive for decreased concentration and depression.  All other systems reviewed and are negative.  Family History  Problem Relation Age of Onset  . Cancer Mother        breast   Social History   Socioeconomic History  . Marital status: Married    Spouse name: Not on file  . Number of children: Not on file  . Years of education: Not on file  . Highest education level: Not on file  Occupational History  . Not on file  Social Needs  . Financial resource strain: Not on file  . Food insecurity    Worry: Not on file    Inability: Not on file  . Transportation needs    Medical: Not on file    Non-medical: Not on file  Tobacco Use  . Smoking status: Never Smoker  . Smokeless tobacco: Never Used  Substance and Sexual Activity  . Alcohol use: No  . Drug use: No  . Sexual activity: Not on file  Lifestyle  . Physical activity    Days per week: Not on file    Minutes per session: Not on file  . Stress: Not on file  Relationships  . Social Herbalist on phone: Not on file    Gets together:  Not on file    Attends religious service: Not on file    Active member of club or organization: Not on file    Attends meetings of clubs or organizations: Not on file    Relationship status: Not on file  Other Topics Concern  . Not on file  Social History Narrative  . Not on file       Objective:   Physical Exam Vitals signs reviewed.  Constitutional:      General: She is irritable. She is not in acute distress.    Appearance: She is well-developed.  HENT:      Head: Normocephalic and atraumatic.     Right Ear: Tympanic membrane normal.     Left Ear: Tympanic membrane normal.  Eyes:     Pupils: Pupils are equal, round, and reactive to light.  Neck:     Musculoskeletal: Normal range of motion and neck supple.     Thyroid: No thyromegaly.  Cardiovascular:     Rate and Rhythm: Normal rate and regular rhythm.     Heart sounds: Normal heart sounds. No murmur.  Pulmonary:     Effort: Pulmonary effort is normal. No respiratory distress.     Breath sounds: Normal breath sounds. No wheezing.  Abdominal:     General: Bowel sounds are normal. There is no distension.     Palpations: Abdomen is soft.     Tenderness: There is no abdominal tenderness.  Musculoskeletal: Normal range of motion.        General: No tenderness.  Skin:    General: Skin is warm and dry.  Neurological:     Mental Status: She is alert and oriented to person, place, and time.     Cranial Nerves: No cranial nerve deficit.     Deep Tendon Reflexes: Reflexes are normal and symmetric.  Psychiatric:        Behavior: Behavior normal.        Thought Content: Thought content normal.        Judgment: Judgment normal.       BP 121/84   Pulse 98   Temp 98.4 F (36.9 C) (Temporal)   Ht '5\' 4"'  (1.626 m)   Wt 180 lb 6.4 oz (81.8 kg)   SpO2 99%   BMI 30.97 kg/m      Assessment & Plan:  Alejandra Mcmahon comes in today with chief complaint of Medical Management of Chronic Issues   Diagnosis and orders addressed:  1. Gastroesophageal reflux disease, unspecified whether esophagitis present - CMP14+EGFR - CBC with Differential/Platelet  2. Current moderate episode of major depressive disorder, unspecified whether recurrent ([place]) - CMP14+EGFR - CBC with Differential/Platelet  3. Obesity (BMI 30-39.9) - CMP14+EGFR - CBC with Differential/Platelet  4. Encounter for surveillance of contraceptive pills - CMP14+EGFR - CBC with Differential/Platelet - Drospirenone-Ethinyl  Estradiol-Levomefol (SAFYRAL) 3-0.03-0.451 MG tablet; Take 1 tablet by mouth daily.  Dispense: 84 tablet; Refill: 1  5. GAD (generalized anxiety disorder) - CMP14+EGFR - CBC with Differential/Platelet - escitalopram (LEXAPRO) 20 MG tablet; TAKE 1 TABLET(20 MG) BY MOUTH DAILY  Dispense: 90 tablet; Refill: 3 - busPIRone (BUSPAR) 30 MG tablet; Take 1 tablet (30 mg total) by mouth 2 (two) times daily.  Dispense: 180 tablet; Refill: 3  6. Chronic bilateral low back pain without sciatica - CMP14+EGFR - CBC with Differential/Platelet - cyclobenzaprine (FLEXERIL) 10 MG tablet; Take 1 tablet (10 mg total) by mouth 3 (three) times daily.  Dispense: 90 tablet; Refill: 2  7. Recurrent  major depressive disorder, in partial remission (HCC) - CMP14+EGFR - CBC with Differential/Platelet - escitalopram (LEXAPRO) 20 MG tablet; TAKE 1 TABLET(20 MG) BY MOUTH DAILY  Dispense: 90 tablet; Refill: 3  8. Annual physical exam - CMP14+EGFR - CBC with Differential/Platelet - Lipid panel - TSH  9. Hyperlipidemia, unspecified hyperlipidemia type - CMP14+EGFR - CBC with Differential/Platelet - Lipid panel   Labs pending Health Maintenance reviewed Diet and exercise encouraged  Follow up plan: 6 months   Evelina Dun, FNP

## 2019-05-26 LAB — CBC WITH DIFFERENTIAL/PLATELET
Basophils Absolute: 0.1 10*3/uL (ref 0.0–0.2)
Basos: 1 %
EOS (ABSOLUTE): 0.2 10*3/uL (ref 0.0–0.4)
Eos: 3 %
Hematocrit: 42.5 % (ref 34.0–46.6)
Hemoglobin: 14.1 g/dL (ref 11.1–15.9)
Immature Grans (Abs): 0 10*3/uL (ref 0.0–0.1)
Immature Granulocytes: 0 %
Lymphocytes Absolute: 2.4 10*3/uL (ref 0.7–3.1)
Lymphs: 31 %
MCH: 29.8 pg (ref 26.6–33.0)
MCHC: 33.2 g/dL (ref 31.5–35.7)
MCV: 90 fL (ref 79–97)
Monocytes Absolute: 0.4 10*3/uL (ref 0.1–0.9)
Monocytes: 5 %
Neutrophils Absolute: 4.5 10*3/uL (ref 1.4–7.0)
Neutrophils: 60 %
Platelets: 270 10*3/uL (ref 150–450)
RBC: 4.73 x10E6/uL (ref 3.77–5.28)
RDW: 11.8 % (ref 11.7–15.4)
WBC: 7.6 10*3/uL (ref 3.4–10.8)

## 2019-05-26 LAB — CMP14+EGFR
ALT: 8 IU/L (ref 0–32)
AST: 11 IU/L (ref 0–40)
Albumin/Globulin Ratio: 1.4 (ref 1.2–2.2)
Albumin: 3.9 g/dL (ref 3.8–4.8)
Alkaline Phosphatase: 62 IU/L (ref 39–117)
BUN/Creatinine Ratio: 11 (ref 9–23)
BUN: 10 mg/dL (ref 6–24)
Bilirubin Total: 0.3 mg/dL (ref 0.0–1.2)
CO2: 23 mmol/L (ref 20–29)
Calcium: 9.6 mg/dL (ref 8.7–10.2)
Chloride: 101 mmol/L (ref 96–106)
Creatinine, Ser: 0.9 mg/dL (ref 0.57–1.00)
GFR calc Af Amer: 91 mL/min/{1.73_m2} (ref >59)
GFR calc non Af Amer: 79 mL/min/{1.73_m2} (ref >59)
Globulin, Total: 2.8 g/dL (ref 1.5–4.5)
Glucose: 89 mg/dL (ref 65–99)
Potassium: 4.5 mmol/L (ref 3.5–5.2)
Sodium: 136 mmol/L (ref 134–144)
Total Protein: 6.7 g/dL (ref 6.0–8.5)

## 2019-05-26 LAB — LIPID PANEL
Chol/HDL Ratio: 2.7 ratio (ref 0.0–4.4)
Cholesterol, Total: 199 mg/dL (ref 100–199)
HDL: 74 mg/dL (ref >39)
LDL Chol Calc (NIH): 79 mg/dL (ref 0–99)
Triglycerides: 287 mg/dL — ABNORMAL HIGH (ref 0–149)
VLDL Cholesterol Cal: 46 mg/dL — ABNORMAL HIGH (ref 5–40)

## 2019-05-26 LAB — TSH: TSH: 2.41 u[IU]/mL (ref 0.450–4.500)

## 2019-06-24 ENCOUNTER — Ambulatory Visit
Admission: RE | Admit: 2019-06-24 | Discharge: 2019-06-24 | Disposition: A | Discharge status: Home / Self Care | Payer: BC Managed Care – PPO | Source: Ambulatory Visit | Attending: Family | Admitting: Family

## 2019-06-24 ENCOUNTER — Other Ambulatory Visit: Payer: Self-pay

## 2019-06-24 DIAGNOSIS — Z1231 Encounter for screening mammogram for malignant neoplasm of breast: Secondary | ICD-10-CM

## 2019-06-25 ENCOUNTER — Other Ambulatory Visit: Payer: Self-pay | Admitting: Family

## 2019-06-25 DIAGNOSIS — R928 Other abnormal and inconclusive findings on diagnostic imaging of breast: Secondary | ICD-10-CM

## 2019-06-30 ENCOUNTER — Ambulatory Visit
Admission: RE | Admit: 2019-06-30 | Discharge: 2019-06-30 | Disposition: A | Discharge status: Home / Self Care | Payer: BC Managed Care – PPO | Source: Ambulatory Visit | Attending: Family | Admitting: Family

## 2019-06-30 ENCOUNTER — Other Ambulatory Visit: Payer: Self-pay

## 2019-06-30 ENCOUNTER — Ambulatory Visit: Payer: BC Managed Care – PPO

## 2019-06-30 DIAGNOSIS — R928 Other abnormal and inconclusive findings on diagnostic imaging of breast: Secondary | ICD-10-CM

## 2019-09-30 ENCOUNTER — Other Ambulatory Visit: Payer: Self-pay

## 2019-10-01 ENCOUNTER — Ambulatory Visit: Payer: BC Managed Care – PPO | Admitting: Family

## 2019-10-01 ENCOUNTER — Encounter: Payer: Self-pay | Admitting: Family

## 2019-10-01 VITALS — BP 131/82 | HR 107 | Temp 99.3°F | Ht 64.0 in | Wt 186.4 lb

## 2019-10-01 DIAGNOSIS — F411 Generalized anxiety disorder: Secondary | ICD-10-CM | POA: Diagnosis not present

## 2019-10-01 DIAGNOSIS — F321 Major depressive disorder, single episode, moderate: Secondary | ICD-10-CM

## 2019-10-01 MED ORDER — VENLAFAXINE HCL ER 37.5 MG PO CP24: ORAL_CAPSULE | ORAL | 0 refills | Status: DC

## 2019-10-01 NOTE — Progress Notes (Signed)
Subjective:    Patient ID: Alejandra Mcmahon, female    DOB: 1976/01/01, 44 y.o.   MRN: [id number]  Chief Complaint  Patient presents with  . Anxiety    Anxiety Presents for follow-up visit. Symptoms include decreased concentration, excessive worry, irritability, nervous/anxious behavior and restlessness. Symptoms occur occasionally. The severity of symptoms is moderate. The quality of sleep is good.    Depression        This is a chronic problem.  The current episode started more than 1 year ago.   The onset quality is gradual.   The problem occurs every several days.  Associated symptoms include decreased concentration, irritable and restlessness.  Associated symptoms include no helplessness and no hopelessness.     The symptoms are aggravated by family issues.  Past treatments include SSRIs - Selective serotonin reuptake inhibitors.  Compliance with treatment is good.  Past medical history includes anxiety.       Review of Systems  Constitutional: Positive for irritability.  Psychiatric/Behavioral: Positive for decreased concentration and depression. The patient is nervous/anxious.   All other systems reviewed and are negative.      Objective:   Physical Exam Vitals reviewed.  Constitutional:      General: She is irritable. She is not in acute distress.    Appearance: She is well-developed.  HENT:     Head: Normocephalic and atraumatic.     Right Ear: Tympanic membrane normal.     Left Ear: Tympanic membrane normal.  Eyes:     Pupils: Pupils are equal, round, and reactive to light.  Neck:     Thyroid: No thyromegaly.  Cardiovascular:     Rate and Rhythm: Normal rate and regular rhythm.     Heart sounds: Normal heart sounds. No murmur.  Pulmonary:     Effort: Pulmonary effort is normal. No respiratory distress.     Breath sounds: Normal breath sounds. No wheezing.  Abdominal:     General: Bowel sounds are normal. There is no distension.     Palpations: Abdomen is  soft.     Tenderness: There is no abdominal tenderness.  Musculoskeletal:        General: No tenderness. Normal range of motion.     Cervical back: Normal range of motion and neck supple.  Skin:    General: Skin is warm and dry.  Neurological:     Mental Status: She is alert and oriented to person, place, and time.     Cranial Nerves: No cranial nerve deficit.     Deep Tendon Reflexes: Reflexes are normal and symmetric.  Psychiatric:        Behavior: Behavior normal.        Thought Content: Thought content normal.        Judgment: Judgment normal.     BP 131/82   Pulse (!) 107   Temp 99.3 F (37.4 C) (Temporal)   Ht 5\' 4"  (1.626 m)   Wt 186 lb 6.4 oz (84.6 kg)   SpO2 94%   BMI 32.00 kg/m        Assessment & Plan:  BRIUNNA LEICHT comes in today with chief complaint of Anxiety   Diagnosis and orders addressed:  1. GAD (generalized anxiety disorder) - venlafaxine XR (EFFEXOR XR) 37.5 MG 24 hr capsule; Take 1 capsule (37.5 mg total) by mouth daily with breakfast for 14 days, THEN 2 capsules (75 mg total) daily with breakfast for 14 days.  Dispense: 42 capsule; Refill: 0  2. Current moderate episode of major depressive disorder, unspecified whether recurrent (HCC) - venlafaxine XR (EFFEXOR XR) 37.5 MG 24 hr capsule; Take 1 capsule (37.5 mg total) by mouth daily with breakfast for 14 days, THEN 2 capsules (75 mg total) daily with breakfast for 14 days.  Dispense: 42 capsule; Refill: 0   Continue Lexapro for the next week. Then decrease to 10 mg (1/2 tablet) for 4 days, then 5 mg (1/4 tablet) for 4 days.  Continue Buspar  Stress management  Follow up in 4-6 weeks    Evelina Dun, FNP

## 2019-10-01 NOTE — Patient Instructions (Signed)
Continue Lexapro for the next week. Then decrease to 10 mg (1/2 tablet) for 4 days, then 5 mg (1/4 tablet) for 4 days.     Generalized Anxiety Disorder, Adult Generalized anxiety disorder (GAD) is a mental health disorder. People with this condition constantly worry about everyday events. Unlike normal anxiety, worry related to GAD is not triggered by a specific event. These worries also do not fade or get better with time. GAD interferes with life functions, including relationships, work, and school. GAD can vary from mild to severe. People with severe GAD can have intense waves of anxiety with physical symptoms (panic attacks). What are the causes? The exact cause of GAD is not known. What increases the risk? This condition is more likely to develop in:  Women.  People who have a family history of anxiety disorders.  People who are very shy.  People who experience very stressful life events, such as the death of a loved one.  People who have a very stressful family environment. What are the signs or symptoms? People with GAD often worry excessively about many things in their lives, such as their health and family. They may also be overly concerned about:  Doing well at work.  Being on time.  Natural disasters.  Friendships. Physical symptoms of GAD include:  Fatigue.  Muscle tension or having muscle twitches.  Trembling or feeling shaky.  Being easily startled.  Feeling like your heart is pounding or racing.  Feeling out of breath or like you cannot take a deep breath.  Having trouble falling asleep or staying asleep.  Sweating.  Nausea, diarrhea, or irritable bowel syndrome (IBS).  Headaches.  Trouble concentrating or remembering facts.  Restlessness.  Irritability. How is this diagnosed? Your health care provider can diagnose GAD based on your symptoms and medical history. You will also have a physical exam. The health care provider will ask specific  questions about your symptoms, including how severe they are, when they started, and if they come and go. Your health care provider may ask you about your use of alcohol or drugs, including prescription medicines. Your health care provider may refer you to a mental health specialist for further evaluation. Your health care provider will do a thorough examination and may perform additional tests to rule out other possible causes of your symptoms. To be diagnosed with GAD, a person must have anxiety that:  Is out of his or her control.  Affects several different aspects of his or her life, such as work and relationships.  Causes distress that makes him or her unable to take part in normal activities.  Includes at least three physical symptoms of GAD, such as restlessness, fatigue, trouble concentrating, irritability, muscle tension, or sleep problems. Before your health care provider can confirm a diagnosis of GAD, these symptoms must be present more days than they are not, and they must last for six months or longer. How is this treated? The following therapies are usually used to treat GAD:  Medicine. Antidepressant medicine is usually prescribed for long-term daily control. Antianxiety medicines may be added in severe cases, especially when panic attacks occur.  Talk therapy (psychotherapy). Certain types of talk therapy can be helpful in treating GAD by providing support, education, and guidance. Options include: ? Cognitive behavioral therapy (CBT). People learn coping skills and techniques to ease their anxiety. They learn to identify unrealistic or negative thoughts and behaviors and to replace them with positive ones. ? Acceptance and commitment therapy (ACT). This  treatment teaches people how to be mindful as a way to cope with unwanted thoughts and feelings. ? Biofeedback. This process trains you to manage your body's response (physiological response) through breathing techniques and  relaxation methods. You will work with a therapist while machines are used to monitor your physical symptoms.  Stress management techniques. These include yoga, meditation, and exercise. A mental health specialist can help determine which treatment is best for you. Some people see improvement with one type of therapy. However, other people require a combination of therapies. Follow these instructions at home:  Take over-the-counter and prescription medicines only as told by your health care provider.  Try to maintain a normal routine.  Try to anticipate stressful situations and allow extra time to manage them.  Practice any stress management or self-calming techniques as taught by your health care provider.  Do not punish yourself for setbacks or for not making progress.  Try to recognize your accomplishments, even if they are small.  Keep all follow-up visits as told by your health care provider. This is important. Contact a health care provider if:  Your symptoms do not get better.  Your symptoms get worse.  You have signs of depression, such as: ? A persistently sad, cranky, or irritable mood. ? Loss of enjoyment in activities that used to bring you joy. ? Change in weight or eating. ? Changes in sleeping habits. ? Avoiding friends or family members. ? Loss of energy for normal tasks. ? Feelings of guilt or worthlessness. Get help right away if:  You have serious thoughts about hurting yourself or others. If you ever feel like you may hurt yourself or others, or have thoughts about taking your own life, get help right away. You can go to your nearest emergency department or call:  Your local emergency services (911 in the [place]  A suicide crisis helpline, such as the National Suicide Prevention Lifeline at 910-421-5428. This is open 24 hours a day. Summary  Generalized anxiety disorder (GAD) is a mental health disorder that involves worry that is not triggered by a  specific event.  People with GAD often worry excessively about many things in their lives, such as their health and family.  GAD may cause physical symptoms such as restlessness, trouble concentrating, sleep problems, frequent sweating, nausea, diarrhea, headaches, and trembling or muscle twitching.  A mental health specialist can help determine which treatment is best for you. Some people see improvement with one type of therapy. However, other people require a combination of therapies. This information is not intended to replace advice given to you by your health care provider. Make sure you discuss any questions you have with your health care provider. Document Revised: 06/21/2017 Document Reviewed: 05/29/2016 Elsevier Patient Education  2020 [employer].

## 2019-11-03 ENCOUNTER — Encounter: Payer: Self-pay | Admitting: Family

## 2019-11-03 ENCOUNTER — Ambulatory Visit (INDEPENDENT_AMBULATORY_CARE_PROVIDER_SITE_OTHER): Payer: BC Managed Care – PPO | Admitting: Family

## 2019-11-03 ENCOUNTER — Other Ambulatory Visit: Payer: Self-pay | Admitting: Family

## 2019-11-03 DIAGNOSIS — F411 Generalized anxiety disorder: Secondary | ICD-10-CM | POA: Diagnosis not present

## 2019-11-03 DIAGNOSIS — Z3041 Encounter for surveillance of contraceptive pills: Secondary | ICD-10-CM

## 2019-11-03 DIAGNOSIS — F321 Major depressive disorder, single episode, moderate: Secondary | ICD-10-CM

## 2019-11-03 MED ORDER — DULOXETINE HCL 30 MG PO CPEP: ORAL_CAPSULE | ORAL | 0 refills | Status: DC

## 2019-11-03 MED ORDER — DULOXETINE HCL 60 MG PO CPEP: 60.0000 mg | ORAL_CAPSULE | Freq: Every day | ORAL | 3 refills | Status: DC

## 2019-11-03 NOTE — Progress Notes (Signed)
Virtual Visit via telephone Note Due to COVID-19 pandemic this visit was conducted virtually. This visit type was conducted due to national recommendations for restrictions regarding the COVID-19 Pandemic (e.g. social distancing, sheltering in place) in an effort to limit this patient's exposure and mitigate transmission in our community. All issues noted in this document were discussed and addressed.  A physical exam was not performed with this format.  I connected with Alejandra Mcmahon on 11/03/19 at 10:00 AM by telephone and verified that I am speaking with the correct person using two identifiers. Alejandra Mcmahon is currently located at work and no one is currently with her during visit. The provider, Evelina Dun, FNP is located in their office at time of visit.  I discussed the limitations, risks, security and privacy concerns of performing an evaluation and management service by telephone and the availability of in person appointments. I also discussed with the patient that there may be a patient responsible charge related to this service. The patient expressed understanding and agreed to proceed.   History and Present Illness:  Pt calls the office today to recheck GAD. She was seen on 10/01/19 and we started on Effexor 37.5 mg and then increased to 75 mcg. She reports when she increased the dose to 75 mg she felt "weird and foggy headed".  Anxiety Presents for follow-up visit. Symptoms include depressed mood, excessive worry, irritability, nervous/anxious behavior and restlessness. Symptoms occur occasionally. The severity of symptoms is moderate.    Depression        This is a chronic problem.  The current episode started more than 1 year ago.   The onset quality is gradual.   The problem occurs intermittently.  The problem has been waxing and waning since onset.  Associated symptoms include irritable, restlessness, decreased interest and sad.  Associated symptoms include no  hopelessness.  Past treatments include SNRIs - Serotonin and norepinephrine reuptake inhibitors.  Past medical history includes anxiety.       Review of Systems  Constitutional: Positive for irritability.  Psychiatric/Behavioral: Positive for depression. The patient is nervous/anxious.   All other systems reviewed and are negative.    Observations/Objective: No SOB or distress noted   Assessment and Plan: 1. Current moderate episode of major depressive disorder, unspecified whether recurrent (HCC) - DULoxetine (CYMBALTA) 30 MG capsule; Take 1 capsule (30 mg total) by mouth daily for 14 days, THEN 2 capsules (60 mg total) daily for 14 days.  Dispense: 42 capsule; Refill: 0 - DULoxetine (CYMBALTA) 60 MG capsule; Take 1 capsule (60 mg total) by mouth daily.  Dispense: 90 capsule; Refill: 3  2. GAD (generalized anxiety disorder) - DULoxetine (CYMBALTA) 30 MG capsule; Take 1 capsule (30 mg total) by mouth daily for 14 days, THEN 2 capsules (60 mg total) daily for 14 days.  Dispense: 42 capsule; Refill: 0 - DULoxetine (CYMBALTA) 60 MG capsule; Take 1 capsule (60 mg total) by mouth daily.  Dispense: 90 capsule; Refill: 3  Will change Effexor to Cymbalta and see if this is a better fit for her.  Stress management discussed Possible adverse effects discussed RTO in 6 weeks to discussed    I discussed the assessment and treatment plan with the patient. The patient was provided an opportunity to ask questions and all were answered. The patient agreed with the plan and demonstrated an understanding of the instructions.   The patient was advised to call back or seek an in-person evaluation if the symptoms worsen or  if the condition fails to improve as anticipated.  The above assessment and management plan was discussed with the patient. The patient verbalized understanding of and has agreed to the management plan. Patient is aware to call the clinic if symptoms persist or worsen. Patient is  aware when to return to the clinic for a follow-up visit. Patient educated on when it is appropriate to go to the emergency department.   Time call ended:  10:14 am  I provided 14 minutes of non-face-to-face time during this encounter.    Jannifer Rodney, FNP

## 2019-12-01 ENCOUNTER — Other Ambulatory Visit: Payer: Self-pay | Admitting: Family

## 2019-12-01 DIAGNOSIS — F321 Major depressive disorder, single episode, moderate: Secondary | ICD-10-CM

## 2019-12-01 DIAGNOSIS — F411 Generalized anxiety disorder: Secondary | ICD-10-CM

## 2019-12-15 ENCOUNTER — Encounter: Payer: Self-pay | Admitting: Family

## 2019-12-15 ENCOUNTER — Ambulatory Visit (INDEPENDENT_AMBULATORY_CARE_PROVIDER_SITE_OTHER): Payer: BC Managed Care – PPO | Admitting: Family

## 2019-12-15 DIAGNOSIS — F411 Generalized anxiety disorder: Secondary | ICD-10-CM

## 2019-12-15 DIAGNOSIS — F321 Major depressive disorder, single episode, moderate: Secondary | ICD-10-CM | POA: Diagnosis not present

## 2019-12-15 NOTE — Progress Notes (Signed)
   Virtual Visit via telephone Note Due to COVID-19 pandemic this visit was conducted virtually. This visit type was conducted due to national recommendations for restrictions regarding the COVID-19 Pandemic (e.g. social distancing, sheltering in place) in an effort to limit this patient's exposure and mitigate transmission in our community. All issues noted in this document were discussed and addressed.  A physical exam was not performed with this format.  I connected with Alejandra Mcmahon on 12/15/19 at 12:05 pm  by telephone and verified that I am speaking with the correct person using two identifiers. Alejandra Mcmahon is currently located at work  and no one  is currently with her  during visit. The provider, Jannifer Rodney, FNP is located in their office at time of visit.  I discussed the limitations, risks, security and privacy concerns of performing an evaluation and management service by telephone and the availability of in person appointments. I also discussed with the patient that there may be a patient responsible charge related to this service. The patient expressed understanding and agreed to proceed.   History and Present Illness:  Pt calls the office today for follow up on GAD and Depression. We had a telephone call on 11/03/19 and we stopped her Effexor and started on Cymbalta. She reports this is a much better fit for her and has seen a decrease in her anxiety and depression. She also reports sleeping better.  Anxiety Presents for follow-up visit. Symptoms include depressed mood, excessive worry, irritability, nervous/anxious behavior and restlessness. Symptoms occur occasionally. The severity of symptoms is moderate.    Depression        This is a chronic problem.  The current episode started more than 1 year ago.   The onset quality is gradual.   The problem occurs intermittently.  The problem has been gradually improving since onset.  Associated symptoms include irritable,  restlessness, decreased interest and sad.  Associated symptoms include no helplessness and no hopelessness.  Past treatments include SNRIs - Serotonin and norepinephrine reuptake inhibitors.  Past medical history includes anxiety.       Review of Systems  Constitutional: Positive for irritability.  Psychiatric/Behavioral: Positive for depression. The patient is nervous/anxious.      Observations/Objective: No SOB or distress noted, pt seem up beat and happy  Assessment and Plan: 1. GAD (generalized anxiety disorder)  2. Current moderate episode of major depressive disorder, unspecified whether recurrent (HCC)  Continue Cymbalta 60 mg daily Stress management discussed Call if symptoms worsen or do not improve      I discussed the assessment and treatment plan with the patient. The patient was provided an opportunity to ask questions and all were answered. The patient agreed with the plan and demonstrated an understanding of the instructions.   The patient was advised to call back or seek an in-person evaluation if the symptoms worsen or if the condition fails to improve as anticipated.  The above assessment and management plan was discussed with the patient. The patient verbalized understanding of and has agreed to the management plan. Patient is aware to call the clinic if symptoms persist or worsen. Patient is aware when to return to the clinic for a follow-up visit. Patient educated on when it is appropriate to go to the emergency department.   Time call ended:  12:13 pm  I provided 8 minutes of non-face-to-face time during this encounter.    Jannifer Rodney, FNP

## 2019-12-18 ENCOUNTER — Emergency Department (HOSPITAL_COMMUNITY): Payer: BC Managed Care – PPO

## 2019-12-18 ENCOUNTER — Encounter (HOSPITAL_COMMUNITY): Payer: Self-pay | Admitting: Emergency Medicine

## 2019-12-18 ENCOUNTER — Other Ambulatory Visit: Payer: Self-pay

## 2019-12-18 DIAGNOSIS — Z79899 Other long term (current) drug therapy: Secondary | ICD-10-CM | POA: Diagnosis not present

## 2019-12-18 DIAGNOSIS — S7002XA Contusion of left hip, initial encounter: Secondary | ICD-10-CM | POA: Insufficient documentation

## 2019-12-18 DIAGNOSIS — Y999 Unspecified external cause status: Secondary | ICD-10-CM | POA: Insufficient documentation

## 2019-12-18 DIAGNOSIS — S6992XA Unspecified injury of left wrist, hand and finger(s), initial encounter: Secondary | ICD-10-CM | POA: Insufficient documentation

## 2019-12-18 DIAGNOSIS — Y92410 Unspecified street and highway as the place of occurrence of the external cause: Secondary | ICD-10-CM | POA: Diagnosis not present

## 2019-12-18 DIAGNOSIS — Y93I9 Activity, other involving external motion: Secondary | ICD-10-CM | POA: Insufficient documentation

## 2019-12-18 DIAGNOSIS — S299XXA Unspecified injury of thorax, initial encounter: Secondary | ICD-10-CM | POA: Insufficient documentation

## 2019-12-18 DIAGNOSIS — R109 Unspecified abdominal pain: Secondary | ICD-10-CM | POA: Insufficient documentation
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
## 2019-12-19 ENCOUNTER — Other Ambulatory Visit (HOSPITAL_COMMUNITY): Payer: Self-pay

## 2019-12-19 ENCOUNTER — Emergency Department (HOSPITAL_COMMUNITY)
Admission: EM | Admit: 2019-12-19 | Discharge: 2019-12-19 | Disposition: A | Discharge status: Home / Self Care | Payer: BC Managed Care – PPO | Attending: Emergency Medicine | Admitting: Emergency Medicine

## 2019-12-19 ENCOUNTER — Emergency Department (HOSPITAL_COMMUNITY): Payer: BC Managed Care – PPO

## 2019-12-19 DIAGNOSIS — T07XXXA Unspecified multiple injuries, initial encounter: Secondary | ICD-10-CM

## 2019-12-19 LAB — BASIC METABOLIC PANEL
Anion gap: 9 (ref 5–15)
BUN: 9 mg/dL (ref 6–20)
CO2: 25 mmol/L (ref 22–32)
Calcium: 9 mg/dL (ref 8.9–10.3)
Chloride: 102 mmol/L (ref 98–111)
Creatinine, Ser: 0.78 mg/dL (ref 0.44–1.00)
GFR calc Af Amer: >60 mL/min (ref >60)
GFR calc non Af Amer: >60 mL/min (ref >60)
Glucose, Bld: 109 mg/dL — ABNORMAL HIGH (ref 70–99)
Potassium: 3.5 mmol/L (ref 3.5–5.1)
Sodium: 136 mmol/L (ref 135–145)

## 2019-12-19 LAB — I-STAT BETA HCG BLOOD, ED (MC, WL, AP ONLY): I-stat hCG, quantitative: <5 m[IU]/mL (ref <5)

## 2019-12-19 MED ORDER — IBUPROFEN 800 MG PO TABS
800.0000 mg | ORAL_TABLET | Freq: Once | ORAL | Status: AC
  Administered 2019-12-19: 800 mg via ORAL

## 2019-12-19 MED ORDER — IBUPROFEN 800 MG PO TABS
ORAL_TABLET | ORAL | Status: AC
  Filled 2019-12-19: qty 1

## 2019-12-19 MED ORDER — IOHEXOL 300 MG/ML  SOLN
100.0000 mL | Freq: Once | INTRAMUSCULAR | Status: AC | PRN
  Administered 2019-12-19: 100 mL via INTRAVENOUS

## 2019-12-19 MED ORDER — NAPROXEN 500 MG PO TABS: 500.0000 mg | ORAL_TABLET | Freq: Two times a day (BID) | ORAL | 0 refills | Status: DC | PRN
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
## 2020-01-07 ENCOUNTER — Telehealth: Payer: Self-pay | Admitting: Emergency Medicine

## 2020-01-07 DIAGNOSIS — R197 Diarrhea, unspecified: Secondary | ICD-10-CM

## 2020-01-07 MED ORDER — AZITHROMYCIN 250 MG PO TABS: 500.0000 mg | ORAL_TABLET | Freq: Every day | ORAL | 0 refills | Status: DC

## 2020-01-07 MED ORDER — AZITHROMYCIN 250 MG PO TABS: 500.0000 mg | ORAL_TABLET | Freq: Every day | ORAL | 0 refills | Status: AC

## 2020-01-07 NOTE — Progress Notes (Signed)
We are sorry that you are not feeling well.  Here is how we plan to help!  Based on what you have shared with me it looks like you have Acute Infectious Diarrhea.  Most cases of acute diarrhea are due to infections with virus and bacteria and are self-limited conditions lasting less than 14 days.  For your symptoms you may take Imodium 2 mg tablets that are over the counter at your local pharmacy. Take two tablet now and then one after each loose stool up to 6 a day.  Antibiotics are not needed for most people with diarrhea.  Optional: I have prescribed azithromycin 500 mg daily for 3 days  HOME CARE  We recommend changing your diet to help with your symptoms for the next few days.  Drink plenty of fluids that contain water salt and sugar. Sports drinks such as Gatorade may help.   You may try broths, soups, bananas, applesauce, soft breads, mashed potatoes or crackers.   You are considered infectious for as long as the diarrhea continues. Hand washing or use of alcohol based hand sanitizers is recommend.  It is best to stay out of work or school until your symptoms stop.   GET HELP RIGHT AWAY  If you have dark yellow colored urine or do not pass urine frequently you should drink more fluids.    If your symptoms worsen   If you feel like you are going to pass out (faint)  You have a new problem  MAKE SURE YOU   Understand these instructions.  Will watch your condition.  Will get help right away if you are not doing well or get worse.  Your e-visit answers were reviewed by a board certified advanced clinical practitioner to complete your personal care plan.  Depending on the condition, your plan could have included both over the counter or prescription medications.  If there is a problem please reply  once you have received a response from your provider.  Your safety is important to [place].  If you have drug allergies check your prescription carefully.    You can use MyChart  to ask questions about today's visit, request a non-urgent call back, or ask for a work or school excuse for 24 hours related to this e-Visit. If it has been greater than 24 hours you will need to follow up with your provider, or enter a new e-Visit to address those concerns.   You will get an e-mail in the next two days asking about your experience.  I hope that your e-visit has been valuable and will speed your recovery. Thank you for using e-visits.  I spent 10 minutes review patient's chart and detailing visit.

## 2020-02-16 ENCOUNTER — Other Ambulatory Visit: Payer: Self-pay

## 2020-02-16 MED ORDER — ATORVASTATIN CALCIUM 20 MG PO TABS: 20.0000 mg | ORAL_TABLET | Freq: Every day | ORAL | 0 refills | Status: DC

## 2020-02-29 ENCOUNTER — Other Ambulatory Visit: Payer: Self-pay | Admitting: *Deleted

## 2020-02-29 DIAGNOSIS — F321 Major depressive disorder, single episode, moderate: Secondary | ICD-10-CM

## 2020-02-29 DIAGNOSIS — F411 Generalized anxiety disorder: Secondary | ICD-10-CM

## 2020-02-29 MED ORDER — DULOXETINE HCL 60 MG PO CPEP: ORAL_CAPSULE | ORAL | 0 refills | Status: DC

## 2020-04-11 ENCOUNTER — Other Ambulatory Visit: Payer: Self-pay | Admitting: Family

## 2020-04-11 DIAGNOSIS — Z3041 Encounter for surveillance of contraceptive pills: Secondary | ICD-10-CM

## 2020-04-12 MED ORDER — NORGESTIMATE-ETH ESTRADIOL 0.25-35 MG-MCG PO TABS: 1.0000 | ORAL_TABLET | Freq: Every day | ORAL | 3 refills | Status: DC

## 2020-05-03 ENCOUNTER — Other Ambulatory Visit: Payer: Self-pay | Admitting: Family

## 2020-05-26 ENCOUNTER — Other Ambulatory Visit: Payer: Self-pay | Admitting: Family

## 2020-05-26 DIAGNOSIS — F321 Major depressive disorder, single episode, moderate: Secondary | ICD-10-CM

## 2020-05-26 DIAGNOSIS — F411 Generalized anxiety disorder: Secondary | ICD-10-CM

## 2020-06-02 ENCOUNTER — Other Ambulatory Visit: Payer: Self-pay | Admitting: Family

## 2020-06-02 ENCOUNTER — Encounter: Payer: Self-pay | Admitting: Family Medicine

## 2020-06-02 ENCOUNTER — Ambulatory Visit (INDEPENDENT_AMBULATORY_CARE_PROVIDER_SITE_OTHER): Payer: BC Managed Care – PPO | Admitting: Family Medicine

## 2020-06-02 VITALS — BP 145/82 | HR 126 | Temp 97.8°F | Ht 64.0 in

## 2020-06-02 DIAGNOSIS — J069 Acute upper respiratory infection, unspecified: Secondary | ICD-10-CM | POA: Diagnosis not present

## 2020-06-02 LAB — RAPID STREP SCREEN (MED CTR MEBANE ONLY): Strep Gp A Ag, IA W/Reflex: NEGATIVE

## 2020-06-02 LAB — CULTURE, GROUP A STREP

## 2020-06-02 MED ORDER — FLUTICASONE PROPIONATE 50 MCG/ACT NA SUSP: 1.0000 | Freq: Two times a day (BID) | NASAL | 6 refills | Status: DC | PRN

## 2020-06-02 MED ORDER — AZITHROMYCIN 250 MG PO TABS: ORAL_TABLET | ORAL | 0 refills | Status: DC

## 2020-06-02 NOTE — Progress Notes (Signed)
BP (!) 145/82   Pulse (!) 126   Temp 97.8 F (36.6 C)   Ht 5\' 4"  (1.626 m)   SpO2 97%   BMI 28.32 kg/m    Subjective:   Patient ID: Alejandra Mcmahon, female    DOB: 01/04/1976, 44 y.o.   MRN: [id number]  HPI: Alejandra Mcmahon is a 44 y.o. female presenting on 06/02/2020 for URI   HPI Patient is coming in today complaining of cough and congestion and sinus pressure been going on for the past 24 hours.  She also has some drainage and has a lot of sore throat and she said her sore throat got worse throughout yesterday.  She says her cough is mild and not all of the time but is more the sore throat is bothering her the most.  She also feels like she is getting Granix Get down into her chest and she does say that she gets bronchitis sometimes and feels like it starting to have that direction.  She says that there have been some sick people at her office but she does not think anyone had tested positive for Covid but does not necessarily know what they have had.  Relevant past medical, surgical, family and social history reviewed and updated as indicated. Interim medical history since our last visit reviewed. Allergies and medications reviewed and updated.  Review of Systems  Constitutional: Negative for chills and fever.  HENT: Positive for congestion, postnasal drip, rhinorrhea, sinus pressure, sneezing and sore throat. Negative for ear discharge and ear pain.   Eyes: Negative for pain, redness and visual disturbance.  Respiratory: Positive for cough. Negative for chest tightness, shortness of breath and wheezing.   Cardiovascular: Negative for chest pain and leg swelling.  Genitourinary: Negative for difficulty urinating and dysuria.  Musculoskeletal: Negative for back pain and gait problem.  Skin: Negative for rash.  Neurological: Negative for light-headedness and headaches.  Psychiatric/Behavioral: Negative for agitation and behavioral problems.  All other systems reviewed and are  negative.   Per HPI unless specifically indicated above   Allergies as of 06/02/2020      Reactions   Amoxicillin    Celexa [citalopram Hydrobromide] Nausea Only   Wellbutrin [bupropion]       Medication List       Accurate as of June 02, 2020  4:15 PM. If you have any questions, ask your nurse or doctor.        STOP taking these medications   Safyral 3-0.03-0.451 MG tablet Generic drug: Drospirenone-Ethinyl Estradiol-Levomefol Stopped by: Elige Radon Augustine Brannick, MD     TAKE these medications   atorvastatin 20 MG tablet Commonly known as: LIPITOR Take 1 tablet (20 mg total) by mouth daily. (Needs to be seen before next refill)   busPIRone 30 MG tablet Commonly known as: BUSPAR Take 1 tablet (30 mg total) by mouth 2 (two) times daily.   cyclobenzaprine 10 MG tablet Commonly known as: FLEXERIL Take 1 tablet (10 mg total) by mouth 3 (three) times daily.   DULoxetine 60 MG capsule Commonly known as: CYMBALTA TAKE 1 CAPSULE DAILY   naproxen 500 MG tablet Commonly known as: NAPROSYN Take 1 tablet (500 mg total) by mouth 2 (two) times daily as needed.   norgestimate-ethinyl estradiol 0.25-35 MG-MCG tablet Commonly known as: Sprintec 28 Take 1 tablet by mouth daily.   triamcinolone cream 0.1 % Commonly known as: KENALOG Apply 1 application topically 2 (two) times daily.        Objective:  BP (!) 145/82   Pulse (!) 126   Temp 97.8 F (36.6 C)   Ht 5\' 4"  (1.626 m)   SpO2 97%   BMI 28.32 kg/m   Wt Readings from Last 3 Encounters:  12/18/19 165 lb (74.8 kg)  10/01/19 186 lb 6.4 oz (84.6 kg)  05/25/19 180 lb 6.4 oz (81.8 kg)    Physical Exam Vitals reviewed.  Constitutional:      General: She is not in acute distress.    Appearance: She is well-developed. She is not diaphoretic.  HENT:     Right Ear: Tympanic membrane, ear canal and external ear normal.     Left Ear: Tympanic membrane, ear canal and external ear normal.     Nose: Mucosal edema and  rhinorrhea present.     Right Sinus: No maxillary sinus tenderness or frontal sinus tenderness.     Left Sinus: No maxillary sinus tenderness or frontal sinus tenderness.     Mouth/Throat:     Pharynx: Uvula midline. Posterior oropharyngeal erythema present. No oropharyngeal exudate.     Tonsils: No tonsillar abscesses.  Eyes:     Conjunctiva/sclera: Conjunctivae normal.  Cardiovascular:     Rate and Rhythm: Normal rate and regular rhythm.     Heart sounds: Normal heart sounds. No murmur heard.   Pulmonary:     Effort: Pulmonary effort is normal. No respiratory distress.     Breath sounds: Normal breath sounds. No wheezing.  Musculoskeletal:        General: No tenderness. Normal range of motion.  Skin:    General: Skin is warm and dry.     Findings: No rash.  Neurological:     Mental Status: She is alert and oriented to person, place, and time.     Coordination: Coordination normal.  Psychiatric:        Behavior: Behavior normal.       Assessment & Plan:   Problem List Items Addressed This Visit    None    Visit Diagnoses    Upper respiratory tract infection, unspecified type    -  Primary   Relevant Medications   azithromycin (ZITHROMAX) 250 MG tablet   fluticasone (FLONASE) 50 MCG/ACT nasal spray   Other Relevant Orders   Rapid Strep Screen ([facility] ONLY) (Completed)   Novel Coronavirus, NAA ([facility]) (Completed)    Treat like bronchitis give azithromycin and Flonase.  Follow up plan: Return if symptoms worsen or fail to improve.  Counseling provided for all of the vaccine components No orders of the defined types were placed in this encounter.   Arville Care, MD [facility] 06/02/2020, 4:15 PM

## 2020-06-03 ENCOUNTER — Ambulatory Visit: Payer: BC Managed Care – PPO | Admitting: Family

## 2020-06-04 ENCOUNTER — Encounter: Payer: Self-pay | Admitting: Family Medicine

## 2020-06-04 LAB — NOVEL CORONAVIRUS, NAA: SARS-CoV-2, NAA: NOT DETECTED

## 2020-06-04 LAB — SARS-COV-2, NAA 2 DAY TAT

## 2020-06-28 ENCOUNTER — Ambulatory Visit: Payer: BC Managed Care – PPO | Admitting: Family

## 2020-07-02 ENCOUNTER — Other Ambulatory Visit: Payer: Self-pay | Admitting: Family

## 2020-07-04 NOTE — Telephone Encounter (Signed)
[facility]. NTBS 30 days given 06/02/20

## 2020-07-05 ENCOUNTER — Other Ambulatory Visit: Payer: Self-pay | Admitting: Family

## 2020-07-05 DIAGNOSIS — F411 Generalized anxiety disorder: Secondary | ICD-10-CM

## 2020-08-03 ENCOUNTER — Other Ambulatory Visit: Payer: Self-pay | Admitting: Family

## 2020-08-03 DIAGNOSIS — F411 Generalized anxiety disorder: Secondary | ICD-10-CM

## 2020-08-03 NOTE — Telephone Encounter (Signed)
Left message for patient to call us back to scheduled an appointment for medication refill.

## 2020-08-03 NOTE — Telephone Encounter (Signed)
[facility] NTBS 30 days given 07/06/20

## 2020-08-08 ENCOUNTER — Telehealth: Payer: BC Managed Care – PPO | Admitting: Physician Assistant

## 2020-08-08 DIAGNOSIS — R059 Cough, unspecified: Secondary | ICD-10-CM

## 2020-08-08 DIAGNOSIS — U071 COVID-19: Secondary | ICD-10-CM

## 2020-08-08 MED ORDER — ALBUTEROL SULFATE HFA 108 (90 BASE) MCG/ACT IN AERS: 2.0000 | INHALATION_SPRAY | RESPIRATORY_TRACT | 0 refills | Status: DC | PRN

## 2020-08-08 MED ORDER — BENZONATATE 100 MG PO CAPS
100.0000 mg | ORAL_CAPSULE | Freq: Three times a day (TID) | ORAL | 0 refills | Status: DC | PRN
Start: 2020-08-08 — End: 2020-09-28

## 2020-08-08 MED ORDER — AZELASTINE HCL 0.1 % NA SOLN: 1.0000 | Freq: Two times a day (BID) | NASAL | 0 refills | Status: DC

## 2020-08-08 MED ORDER — NAPROXEN 500 MG PO TABS: 500.0000 mg | ORAL_TABLET | Freq: Two times a day (BID) | ORAL | 0 refills | Status: DC

## 2020-08-08 NOTE — Progress Notes (Signed)
E-Visit for Corona Virus Screening  We are sorry you are not feeling well. We are here to help!  You have tested positive for COVID-19, meaning that you were infected with the novel coronavirus and could give the virus to others.  It is vitally important that you stay home so you do not spread it to others.      Please continue isolation at home, for at least 10 days since the start of your symptoms and until you have had 24 hours with no fever (without taking a fever reducer) and with improving of symptoms.  If you have no symptoms but tested positive (or all symptoms resolve after 5 days and you have no fever) you can leave your house but continue to wear a mask around others for an additional 5 days. If you have a fever,continue to stay home until you have had 24 hours of no fever. Most cases improve 5-10 days from onset but we have seen a small number of patients who have gotten worse after the 10 days.  Please be sure to watch for worsening symptoms and remain taking the proper precautions.   Go to the nearest hospital ED for assessment if fever/cough/breathlessness are severe or illness seems like a threat to life.    The following symptoms may appear 2-14 days after exposure: . Fever . Cough . Shortness of breath or difficulty breathing . Chills . Repeated shaking with chills . Muscle pain . Headache . Sore throat . New loss of taste or smell . Fatigue . Congestion or runny nose . Nausea or vomiting . Diarrhea  You have been enrolled in [facility] Monitoring for COVID-19. Daily you will receive a questionnaire within the MyChart website. Our COVID-19 response team will be monitoring your responses daily.  You can use medication such as A prescription cough medication called Tessalon Perles 100 mg. You may take 1-2 capsules every 8 hours as needed for cough, A prescription inhaler called Albuterol MDI 90 mcg /actuation 2 puffs every 4 hours as needed for shortness of breath,  wheezing, cough, A prescription anti-inflammatory called Naprosyn 500 mg. Take twice daily as needed for fever or body aches for 2 weeks and A prescription for Azelastine nasal spray 2 sprays in each nostril twice per day  You have tested positive for Covid but because you are not considered high risk you do not qualify for monoclonal antibody infusion.  Supportive care is all that is needed.   You may also take acetaminophen (Tylenol) as needed for fever.  HOME CARE: . Only take medications as instructed by your medical team. . Drink plenty of fluids and get plenty of rest. . A steam or ultrasonic humidifier can help if you have congestion.   GET HELP RIGHT AWAY IF YOU HAVE EMERGENCY WARNING SIGNS.  Call 911 or proceed to your closest emergency facility if: . You develop worsening high fever. . Trouble breathing . Bluish lips or face . Persistent pain or pressure in the chest . New confusion . Inability to wake or stay awake . You cough up blood. . Your symptoms become more severe . Inability to hold down food or fluids  This list is not all possible symptoms. Contact your medical provider for any symptoms that are severe or concerning to you.    Your e-visit answers were reviewed by a board certified advanced clinical practitioner to complete your personal care plan.  Depending on the condition, your plan could have included both over the  counter or prescription medications.  If there is a problem please reply once you have received a response from your provider.  Your safety is important to [place].  If you have drug allergies check your prescription carefully.    You can use MyChart to ask questions about today's visit, request a non-urgent call back, or ask for a work or school excuse for 24 hours related to this e-Visit. If it has been greater than 24 hours you will need to follow up with your provider, or enter a new e-Visit to address those concerns. You will get an e-mail in the  next two days asking about your experience.  I hope that your e-visit has been valuable and will speed your recovery. Thank you for using e-visits.      Greater than 5 minutes, yet less than 10 minutes of time have been spent researching, coordinating and implementing care for this patient today.

## 2020-08-17 ENCOUNTER — Telehealth: Payer: Self-pay

## 2020-08-17 DIAGNOSIS — F321 Major depressive disorder, single episode, moderate: Secondary | ICD-10-CM

## 2020-08-17 DIAGNOSIS — F411 Generalized anxiety disorder: Secondary | ICD-10-CM

## 2020-08-17 MED ORDER — ATORVASTATIN CALCIUM 20 MG PO TABS: 20.0000 mg | ORAL_TABLET | Freq: Every day | ORAL | 1 refills | Status: DC

## 2020-08-17 MED ORDER — DULOXETINE HCL 60 MG PO CPEP: ORAL_CAPSULE | ORAL | 1 refills | Status: DC

## 2020-08-17 NOTE — Telephone Encounter (Signed)
Patient has upcoming appt on March 9th.  Can we refill her medicines enough until this appt? atorvastatin 20 MG tablet DULoxetine 60 MG capsule   Pharmacy is:  Therapist, [facility] on [address] in [place]

## 2020-08-17 NOTE — Telephone Encounter (Signed)
Medication sent in, patient informed.

## 2020-08-18 ENCOUNTER — Other Ambulatory Visit: Payer: Self-pay | Admitting: Family Medicine

## 2020-08-18 DIAGNOSIS — Z1231 Encounter for screening mammogram for malignant neoplasm of breast: Secondary | ICD-10-CM

## 2020-09-12 ENCOUNTER — Inpatient Hospital Stay: Admission: RE | Admit: 2020-09-12 | Payer: BC Managed Care – PPO | Source: Ambulatory Visit

## 2020-09-12 ENCOUNTER — Other Ambulatory Visit: Payer: Self-pay | Admitting: Family Medicine

## 2020-09-12 DIAGNOSIS — Z1231 Encounter for screening mammogram for malignant neoplasm of breast: Secondary | ICD-10-CM

## 2020-09-15 ENCOUNTER — Other Ambulatory Visit: Payer: Self-pay

## 2020-09-15 ENCOUNTER — Ambulatory Visit
Admission: RE | Admit: 2020-09-15 | Discharge: 2020-09-15 | Disposition: A | Discharge status: Home / Self Care | Payer: BC Managed Care – PPO | Source: Ambulatory Visit

## 2020-09-15 DIAGNOSIS — Z1231 Encounter for screening mammogram for malignant neoplasm of breast: Secondary | ICD-10-CM

## 2020-09-28 ENCOUNTER — Encounter: Payer: Self-pay | Admitting: Family

## 2020-09-28 ENCOUNTER — Ambulatory Visit (INDEPENDENT_AMBULATORY_CARE_PROVIDER_SITE_OTHER): Payer: BC Managed Care – PPO | Admitting: Family

## 2020-09-28 ENCOUNTER — Other Ambulatory Visit: Payer: Self-pay

## 2020-09-28 ENCOUNTER — Other Ambulatory Visit (HOSPITAL_COMMUNITY)
Admission: RE | Admit: 2020-09-28 | Discharge: 2020-09-28 | Disposition: A | Discharge status: Home / Self Care | Payer: BC Managed Care – PPO | Source: Ambulatory Visit | Attending: Family | Admitting: Family

## 2020-09-28 VITALS — BP 127/81 | HR 94 | Temp 98.3°F | Ht 64.0 in | Wt 174.8 lb

## 2020-09-28 DIAGNOSIS — G8929 Other chronic pain: Secondary | ICD-10-CM

## 2020-09-28 DIAGNOSIS — Z0001 Encounter for general adult medical examination with abnormal findings: Secondary | ICD-10-CM | POA: Diagnosis not present

## 2020-09-28 DIAGNOSIS — F321 Major depressive disorder, single episode, moderate: Secondary | ICD-10-CM

## 2020-09-28 DIAGNOSIS — Z0000 Encounter for general adult medical examination without abnormal findings: Secondary | ICD-10-CM

## 2020-09-28 DIAGNOSIS — E785 Hyperlipidemia, unspecified: Secondary | ICD-10-CM

## 2020-09-28 DIAGNOSIS — E669 Obesity, unspecified: Secondary | ICD-10-CM

## 2020-09-28 DIAGNOSIS — Z01419 Encounter for gynecological examination (general) (routine) without abnormal findings: Secondary | ICD-10-CM | POA: Diagnosis present

## 2020-09-28 DIAGNOSIS — Z3041 Encounter for surveillance of contraceptive pills: Secondary | ICD-10-CM

## 2020-09-28 DIAGNOSIS — K219 Gastro-esophageal reflux disease without esophagitis: Secondary | ICD-10-CM

## 2020-09-28 DIAGNOSIS — F411 Generalized anxiety disorder: Secondary | ICD-10-CM | POA: Diagnosis not present

## 2020-09-28 DIAGNOSIS — M545 Low back pain, unspecified: Secondary | ICD-10-CM

## 2020-09-28 MED ORDER — TRIAMCINOLONE ACETONIDE 0.1 % EX CREA: 1.0000 "application " | TOPICAL_CREAM | Freq: Two times a day (BID) | CUTANEOUS | 0 refills | Status: DC

## 2020-09-28 MED ORDER — ATORVASTATIN CALCIUM 20 MG PO TABS
20.0000 mg | ORAL_TABLET | Freq: Every day | ORAL | 1 refills | Status: DC
Start: 2020-09-28 — End: 2020-10-24

## 2020-09-28 MED ORDER — NORGESTIMATE-ETH ESTRADIOL 0.25-35 MG-MCG PO TABS: 1.0000 | ORAL_TABLET | Freq: Every day | ORAL | 3 refills | Status: DC

## 2020-09-28 MED ORDER — CYCLOBENZAPRINE HCL 10 MG PO TABS: 10.0000 mg | ORAL_TABLET | Freq: Three times a day (TID) | ORAL | 2 refills | Status: DC

## 2020-09-28 MED ORDER — BUSPIRONE HCL 30 MG PO TABS: 30.0000 mg | ORAL_TABLET | Freq: Two times a day (BID) | ORAL | 0 refills | Status: DC

## 2020-09-28 MED ORDER — DULOXETINE HCL 60 MG PO CPEP: ORAL_CAPSULE | ORAL | 1 refills | Status: DC

## 2020-09-28 NOTE — Patient Instructions (Signed)
Health Maintenance, Female Adopting a healthy lifestyle and getting preventive care are important in promoting health and wellness. Ask your health care provider about:  The right schedule for you to have regular tests and exams.  Things you can do on your own to prevent diseases and keep yourself healthy. What should I know about diet, weight, and exercise? Eat a healthy diet  Eat a diet that includes plenty of vegetables, fruits, low-fat dairy products, and lean protein.  Do not eat a lot of foods that are high in solid fats, added sugars, or sodium.   Maintain a healthy weight Body mass index (BMI) is used to identify weight problems. It estimates body fat based on height and weight. Your health care provider can help determine your BMI and help you achieve or maintain a healthy weight. Get regular exercise Get regular exercise. This is one of the most important things you can do for your health. Most adults should:  Exercise for at least 150 minutes each week. The exercise should increase your heart rate and make you sweat (moderate-intensity exercise).  Do strengthening exercises at least twice a week. This is in addition to the moderate-intensity exercise.  Spend less time sitting. Even light physical activity can be beneficial. Watch cholesterol and blood lipids Have your blood tested for lipids and cholesterol at 44 years of age, then have this test every 5 years. Have your cholesterol levels checked more often if:  Your lipid or cholesterol levels are high.  You are older than 44 years of age.  You are at high risk for heart disease. What should I know about cancer screening? Depending on your health history and family history, you may need to have cancer screening at various ages. This may include screening for:  Breast cancer.  Cervical cancer.  Colorectal cancer.  Skin cancer.  Lung cancer. What should I know about heart disease, diabetes, and high blood  pressure? Blood pressure and heart disease  High blood pressure causes heart disease and increases the risk of stroke. This is more likely to develop in people who have high blood pressure readings, are of African descent, or are overweight.  Have your blood pressure checked: ? Every 3-5 years if you are 18-39 years of age. ? Every year if you are 40 years old or older. Diabetes Have regular diabetes screenings. This checks your fasting blood sugar level. Have the screening done:  Once every three years after age 40 if you are at a normal weight and have a low risk for diabetes.  More often and at a younger age if you are overweight or have a high risk for diabetes. What should I know about preventing infection? Hepatitis B If you have a higher risk for hepatitis B, you should be screened for this virus. Talk with your health care provider to find out if you are at risk for hepatitis B infection. Hepatitis C Testing is recommended for:  Everyone born from 1945 through 1965.  Anyone with known risk factors for hepatitis C. Sexually transmitted infections (STIs)  Get screened for STIs, including gonorrhea and chlamydia, if: ? You are sexually active and are younger than 44 years of age. ? You are older than 44 years of age and your health care provider tells you that you are at risk for this type of infection. ? Your sexual activity has changed since you were last screened, and you are at increased risk for chlamydia or gonorrhea. Ask your health care provider   if you are at risk.  Ask your health care provider about whether you are at high risk for HIV. Your health care provider may recommend a prescription medicine to help prevent HIV infection. If you choose to take medicine to prevent HIV, you should first get tested for HIV. You should then be tested every 3 months for as long as you are taking the medicine. Pregnancy  If you are about to stop having your period (premenopausal) and  you may become pregnant, seek counseling before you get pregnant.  Take 400 to 800 micrograms (mcg) of folic acid every day if you become pregnant.  Ask for birth control (contraception) if you want to prevent pregnancy. Osteoporosis and menopause Osteoporosis is a disease in which the bones lose minerals and strength with aging. This can result in bone fractures. If you are 65 years old or older, or if you are at risk for osteoporosis and fractures, ask your health care provider if you should:  Be screened for bone loss.  Take a calcium or vitamin D supplement to lower your risk of fractures.  Be given hormone replacement therapy (HRT) to treat symptoms of menopause. Follow these instructions at home: Lifestyle  Do not use any products that contain nicotine or tobacco, such as cigarettes, e-cigarettes, and chewing tobacco. If you need help quitting, ask your health care provider.  Do not use street drugs.  Do not share needles.  Ask your health care provider for help if you need support or information about quitting drugs. Alcohol use  Do not drink alcohol if: ? Your health care provider tells you not to drink. ? You are pregnant, may be pregnant, or are planning to become pregnant.  If you drink alcohol: ? Limit how much you use to 0-1 drink a day. ? Limit intake if you are breastfeeding.  Be aware of how much alcohol is in your drink. In the [place] one drink equals one 12 oz bottle of beer (355 mL), one 5 oz glass of wine (148 mL), or one 1 oz glass of hard liquor (44 mL). General instructions  Schedule regular health, dental, and eye exams.  Stay current with your vaccines.  Tell your health care provider if: ? You often feel depressed. ? You have ever been abused or do not feel safe at home. Summary  Adopting a healthy lifestyle and getting preventive care are important in promoting health and wellness.  Follow your health care provider's instructions about healthy  diet, exercising, and getting tested or screened for diseases.  Follow your health care provider's instructions on monitoring your cholesterol and blood pressure. This information is not intended to replace advice given to you by your health care provider. Make sure you discuss any questions you have with your health care provider. Document Revised: 07/02/2018 Document Reviewed: 07/02/2018 Elsevier Patient Education  2021 Elsevier Inc.  

## 2020-09-28 NOTE — Progress Notes (Signed)
Subjective:    Patient ID: Alejandra Mcmahon, female    DOB: July 14, 1976, 45 y.o.   MRN: [id number]  Chief Complaint  Patient presents with  . Annual Exam    With pap   Pt presents to the office today for CPE with pap.  Gynecologic Exam The patient's pertinent negatives include no genital itching. The current episode started more than 1 year ago. The problem occurs intermittently.  Gastroesophageal Reflux She complains of belching and heartburn. This is a chronic problem. The current episode started more than 1 year ago. The problem occurs occasionally. The problem has been waxing and waning. Risk factors include obesity. She has tried a PPI for the symptoms. The treatment provided moderate relief.  Anxiety Presents for follow-up visit. Symptoms include depressed mood, excessive worry, irritability and restlessness. Symptoms occur most days. The severity of symptoms is moderate.    Depression        This is a chronic problem.  The current episode started more than 1 year ago.   The onset quality is gradual.   The problem occurs intermittently.  Associated symptoms include restlessness and sad.  Associated symptoms include no helplessness and no hopelessness.  Past treatments include SNRIs - Serotonin and norepinephrine reuptake inhibitors.  Past medical history includes anxiety.       Review of Systems  Constitutional: Positive for irritability.  Gastrointestinal: Positive for heartburn.  Psychiatric/Behavioral: Positive for depression.  All other systems reviewed and are negative.  Family History  Problem Relation Age of Onset  . Cancer Mother        breast  . Breast cancer Mother    Social History   Socioeconomic History  . Marital status: Married    Spouse name: Not on file  . Number of children: Not on file  . Years of education: Not on file  . Highest education level: Not on file  Occupational History  . Not on file  Tobacco Use  . Smoking status: Never Smoker  .  Smokeless tobacco: Never Used  Vaping Use  . Vaping Use: Never used  Substance and Sexual Activity  . Alcohol use: No  . Drug use: No  . Sexual activity: Not on file  Other Topics Concern  . Not on file  Social History Narrative  . Not on file   Social Determinants of Health   Financial Resource Strain: Not on file  Food Insecurity: Not on file  Transportation Needs: Not on file  Physical Activity: Not on file  Stress: Not on file  Social Connections: Not on file        Objective:   Physical Exam Vitals reviewed.  Constitutional:      General: She is not in acute distress.    Appearance: She is well-developed and well-nourished.  HENT:     Head: Normocephalic and atraumatic.     Right Ear: External ear normal.     Mouth/Throat:     Mouth: Oropharynx is clear and moist.  Eyes:     Pupils: Pupils are equal, round, and reactive to light.  Neck:     Thyroid: No thyromegaly.  Cardiovascular:     Rate and Rhythm: Normal rate and regular rhythm.     Pulses: Intact distal pulses.     Heart sounds: Normal heart sounds. No murmur heard.   Pulmonary:     Effort: Pulmonary effort is normal. No respiratory distress.     Breath sounds: Normal breath sounds. No wheezing.  Chest:  Breasts:     Right: No swelling, bleeding, inverted nipple, mass, nipple discharge, skin change or tenderness.     Left: No swelling, bleeding, inverted nipple, mass, nipple discharge, skin change or tenderness.    Abdominal:     General: Bowel sounds are normal. There is no distension.     Palpations: Abdomen is soft.     Tenderness: There is no abdominal tenderness.  Genitourinary:    Comments: Bimanual exam- no adnexal masses or tenderness, ovaries nonpalpable   Cervix parous and pink- No discharge  Musculoskeletal:        General: No tenderness or edema. Normal range of motion.     Cervical back: Normal range of motion and neck supple.  Skin:    General: Skin is warm and dry.   Neurological:     Mental Status: She is alert and oriented to person, place, and time.     Cranial Nerves: No cranial nerve deficit.     Deep Tendon Reflexes: Reflexes are normal and symmetric.  Psychiatric:        Mood and Affect: Mood and affect normal.        Behavior: Behavior normal.        Thought Content: Thought content normal.        Judgment: Judgment normal.      BP 127/81   Pulse 94   Temp 98.3 F (36.8 C) (Temporal)   Ht '5\' 4"'  (1.626 m)   Wt 174 lb 12.8 oz (79.3 kg)   BMI 30.00 kg/m       Assessment & Plan:  [name] comes in today with chief complaint of Annual Exam (With pap)   Diagnosis and orders addressed:  1. Chronic bilateral low back pain without sciatica - cyclobenzaprine (FLEXERIL) 10 MG tablet; Take 1 tablet (10 mg total) by mouth 3 (three) times daily.  Dispense: 90 tablet; Refill: 2 - CMP14+EGFR - CBC with Differential/Platelet  2. Current moderate episode of major depressive disorder, unspecified whether recurrent (HCC) - DULoxetine (CYMBALTA) 60 MG capsule; TAKE 1 CAPSULE DAILY  Dispense: 30 capsule; Refill: 1 - CMP14+EGFR - CBC with Differential/Platelet  3. GAD (generalized anxiety disorder) - DULoxetine (CYMBALTA) 60 MG capsule; TAKE 1 CAPSULE DAILY  Dispense: 30 capsule; Refill: 1 - busPIRone (BUSPAR) 30 MG tablet; Take 1 tablet (30 mg total) by mouth 2 (two) times daily. (Needs to be seen before next refill)  Dispense: 60 tablet; Refill: 0 - CMP14+EGFR - CBC with Differential/Platelet  4. Annual physical exam - Hepatitis C antibody - CMP14+EGFR - CBC with Differential/Platelet - TSH - Cytology - PAP(Rives)  5. Gynecologic exam normal - CMP14+EGFR - CBC with Differential/Platelet - Cytology - PAP(Guffey)  6. Gastroesophageal reflux disease, unspecified whether esophagitis present - CMP14+EGFR - CBC with Differential/Platelet  7. Obesity (BMI 30-39.9) - CMP14+EGFR - CBC with Differential/Platelet  8.  Hyperlipidemia, unspecified hyperlipidemia type - atorvastatin (LIPITOR) 20 MG tablet; Take 1 tablet (20 mg total) by mouth daily. (Needs to be seen before next refill)  Dispense: 30 tablet; Refill: 1 - CMP14+EGFR - CBC with Differential/Platelet  9. Encounter for surveillance of contraceptive pills   Labs pending Health Maintenance reviewed Diet and exercise encouraged  Follow up plan: 6 months    Evelina Dun, FNP

## 2020-09-29 LAB — CBC WITH DIFFERENTIAL/PLATELET
Basophils Absolute: 0.1 10*3/uL (ref 0.0–0.2)
Basos: 1 %
EOS (ABSOLUTE): 0.2 10*3/uL (ref 0.0–0.4)
Eos: 2 %
Hematocrit: 43.1 % (ref 34.0–46.6)
Hemoglobin: 14.2 g/dL (ref 11.1–15.9)
Immature Grans (Abs): 0 10*3/uL (ref 0.0–0.1)
Immature Granulocytes: 0 %
Lymphocytes Absolute: 3.3 10*3/uL — ABNORMAL HIGH (ref 0.7–3.1)
Lymphs: 34 %
MCH: 30 pg (ref 26.6–33.0)
MCHC: 32.9 g/dL (ref 31.5–35.7)
MCV: 91 fL (ref 79–97)
Monocytes Absolute: 0.6 10*3/uL (ref 0.1–0.9)
Monocytes: 6 %
Neutrophils Absolute: 5.5 10*3/uL (ref 1.4–7.0)
Neutrophils: 57 %
Platelets: 279 10*3/uL (ref 150–450)
RBC: 4.74 x10E6/uL (ref 3.77–5.28)
RDW: 11.7 % (ref 11.7–15.4)
WBC: 9.7 10*3/uL (ref 3.4–10.8)

## 2020-09-29 LAB — CMP14+EGFR
ALT: 13 IU/L (ref 0–32)
AST: 11 IU/L (ref 0–40)
Albumin/Globulin Ratio: 1.7 (ref 1.2–2.2)
Albumin: 3.9 g/dL (ref 3.8–4.8)
Alkaline Phosphatase: 56 IU/L (ref 44–121)
BUN/Creatinine Ratio: 12 (ref 9–23)
BUN: 10 mg/dL (ref 6–24)
Bilirubin Total: <0.2 mg/dL (ref 0.0–1.2)
CO2: 22 mmol/L (ref 20–29)
Calcium: 9.1 mg/dL (ref 8.7–10.2)
Chloride: 104 mmol/L (ref 96–106)
Creatinine, Ser: 0.81 mg/dL (ref 0.57–1.00)
Globulin, Total: 2.3 g/dL (ref 1.5–4.5)
Glucose: 94 mg/dL (ref 65–99)
Potassium: 4.2 mmol/L (ref 3.5–5.2)
Sodium: 139 mmol/L (ref 134–144)
Total Protein: 6.2 g/dL (ref 6.0–8.5)
eGFR: 92 mL/min/{1.73_m2} (ref >59)

## 2020-09-29 LAB — TSH: TSH: 2.17 u[IU]/mL (ref 0.450–4.500)

## 2020-09-29 LAB — HEPATITIS C ANTIBODY: Hep C Virus Ab: <0.1 s/co ratio (ref 0.0–0.9)

## 2020-09-30 LAB — CYTOLOGY - PAP
Adequacy: ABSENT
Diagnosis: NEGATIVE

## 2020-10-23 ENCOUNTER — Other Ambulatory Visit: Payer: Self-pay | Admitting: Family

## 2020-10-23 DIAGNOSIS — E785 Hyperlipidemia, unspecified: Secondary | ICD-10-CM

## 2020-10-23 DIAGNOSIS — F411 Generalized anxiety disorder: Secondary | ICD-10-CM

## 2020-10-23 DIAGNOSIS — F321 Major depressive disorder, single episode, moderate: Secondary | ICD-10-CM

## 2020-12-09 ENCOUNTER — Other Ambulatory Visit: Payer: Self-pay | Admitting: Family Medicine

## 2020-12-09 DIAGNOSIS — J069 Acute upper respiratory infection, unspecified: Secondary | ICD-10-CM

## 2021-01-01 ENCOUNTER — Telehealth: Payer: BC Managed Care – PPO | Admitting: Nurse Practitioner

## 2021-01-01 DIAGNOSIS — J069 Acute upper respiratory infection, unspecified: Secondary | ICD-10-CM | POA: Diagnosis not present

## 2021-01-01 DIAGNOSIS — J00 Acute nasopharyngitis [common cold]: Secondary | ICD-10-CM | POA: Diagnosis not present

## 2021-01-02 MED ORDER — FLUTICASONE PROPIONATE 50 MCG/ACT NA SUSP: 1.0000 | Freq: Every day | NASAL | 6 refills | Status: DC

## 2021-01-02 MED ORDER — DOXYCYCLINE HYCLATE 100 MG PO TABS: 100.0000 mg | ORAL_TABLET | Freq: Two times a day (BID) | ORAL | 0 refills | Status: DC

## 2021-01-02 NOTE — Progress Notes (Signed)
We are sorry you are not feeling well.  Here is how we plan to help!  Based on what you have shared with me, it looks like you may have a viral upper respiratory infection.  Upper respiratory infections are caused by a large number of viruses; however, rhinovirus is the most common cause.   Symptoms vary from person to person, with common symptoms including sore throat, cough, fatigue or lack of energy and feeling of general discomfort.  A low-grade fever of up to 100.4 may present, but is often uncommon.  Symptoms vary however, and are closely related to a person's age or underlying illnesses.  The most common symptoms associated with an upper respiratory infection are nasal discharge or congestion, cough, sneezing, headache and pressure in the ears and face.  These symptoms usually persist for about 3 to 10 days, but can last up to 2 weeks.  It is important to know that upper respiratory infections do not cause serious illness or complications in most cases.    Upper respiratory infections can be transmitted from person to person, with the most common method of transmission being a person's hands.  The virus is able to live on the skin and can infect other persons for up to 2 hours after direct contact.  Also, these can be transmitted when someone coughs or sneezes; thus, it is important to cover the mouth to reduce this risk.  To keep the spread of the illness at bay, good hand hygiene is very important.  This is an infection that is most likely caused by a virus. There are no specific treatments other than to help you with the symptoms until the infection runs its course.  We are sorry you are not feeling well.  Here is how we plan to help!   For nasal congestion, you may use an oral decongestants such as Mucinex D or if you have glaucoma or high blood pressure use plain Mucinex.  Saline nasal spray or nasal drops can help and can safely be used as often as needed for congestion.  For your congestion,  I have prescribed Fluticasone nasal spray one spray in each nostril twice a day  If you do not have a history of heart disease, hypertension, diabetes or thyroid disease, prostate/bladder issues or glaucoma, you may also use Sudafed to treat nasal congestion.  It is highly recommended that you consult with a pharmacist or your primary care physician to ensure this medication is safe for you to take.     If you have a cough, you may use cough suppressants such as Delsym and Robitussin.  If you have glaucoma or high blood pressure, you can also use Coricidin HBP.    Antibiotic was called in due to length of time you have been sick: doxyycline 100mg  bid for10 days.  If you have a sore or scratchy throat, use a saltwater gargle-  to  teaspoon of salt dissolved in a 4-ounce to 8-ounce glass of warm water.  Gargle the solution for approximately 15-30 seconds and then spit.  It is important not to swallow the solution.  You can also use throat lozenges/cough drops and Chloraseptic spray to help with throat pain or discomfort.  Warm or cold liquids can also be helpful in relieving throat pain.  For headache, pain or general discomfort, you can use Ibuprofen or Tylenol as directed.   Some authorities believe that zinc sprays or the use of Echinacea may shorten the course of your symptoms.  HOME CARE Only take medications as instructed by your medical team. Be sure to drink plenty of fluids. Water is fine as well as fruit juices, sodas and electrolyte beverages. You may want to stay away from caffeine or alcohol. If you are nauseated, try taking small sips of liquids. How do you know if you are getting enough fluid? Your urine should be a pale yellow or almost colorless. Get rest. Taking a steamy shower or using a humidifier may help nasal congestion and ease sore throat pain. You can place a towel over your head and breathe in the steam from hot water coming from a faucet. Using a saline nasal spray  works much the same way. Cough drops, hard candies and sore throat lozenges may ease your cough. Avoid close contacts especially the very young and the elderly Cover your mouth if you cough or sneeze Always remember to wash your hands.   GET HELP RIGHT AWAY IF: You develop worsening fever. If your symptoms do not improve within 10 days You develop yellow or green discharge from your nose over 3 days. You have coughing fits You develop a severe head ache or visual changes. You develop shortness of breath, difficulty breathing or start having chest pain Your symptoms persist after you have completed your treatment plan  MAKE SURE YOU  Understand these instructions. Will watch your condition. Will get help right away if you are not doing well or get worse.  Your e-visit answers were reviewed by a board certified advanced clinical practitioner to complete your personal care plan. Depending upon the condition, your plan could have included both over the counter or prescription medications. Please review your pharmacy choice. If there is a problem, you may call our nursing hot line at and have the prescription routed to another pharmacy. Your safety is important to [place]. If you have drug allergies check your prescription carefully.   You can use MyChart to ask questions about today's visit, request a non-urgent call back, or ask for a work or school excuse for 24 hours related to this e-Visit. If it has been greater than 24 hours you will need to follow up with your provider, or enter a new e-Visit to address those concerns. You will get an e-mail in the next two days asking about your experience.  I hope that your e-visit has been valuable and will speed your recovery. Thank you for using e-visits.   5-10 minutes spent reviewing and documenting in chart.

## 2021-01-03 ENCOUNTER — Ambulatory Visit: Payer: BC Managed Care – PPO

## 2021-01-04 ENCOUNTER — Other Ambulatory Visit: Payer: Self-pay | Admitting: Family

## 2021-01-04 DIAGNOSIS — F321 Major depressive disorder, single episode, moderate: Secondary | ICD-10-CM

## 2021-01-04 DIAGNOSIS — F411 Generalized anxiety disorder: Secondary | ICD-10-CM

## 2021-01-04 DIAGNOSIS — E785 Hyperlipidemia, unspecified: Secondary | ICD-10-CM

## 2021-03-03 ENCOUNTER — Other Ambulatory Visit: Payer: Self-pay | Admitting: Family

## 2021-03-22 ENCOUNTER — Other Ambulatory Visit: Payer: Self-pay | Admitting: Family

## 2021-03-22 DIAGNOSIS — E785 Hyperlipidemia, unspecified: Secondary | ICD-10-CM

## 2021-03-29 ENCOUNTER — Other Ambulatory Visit: Payer: Self-pay | Admitting: Family

## 2021-03-29 DIAGNOSIS — F411 Generalized anxiety disorder: Secondary | ICD-10-CM

## 2021-03-29 DIAGNOSIS — F321 Major depressive disorder, single episode, moderate: Secondary | ICD-10-CM

## 2021-04-23 ENCOUNTER — Other Ambulatory Visit: Payer: Self-pay | Admitting: Family

## 2021-04-23 DIAGNOSIS — E785 Hyperlipidemia, unspecified: Secondary | ICD-10-CM

## 2021-04-24 NOTE — Telephone Encounter (Signed)
[facility]. NTBS 30 days given 03/22/21 

## 2021-04-30 ENCOUNTER — Other Ambulatory Visit: Payer: Self-pay | Admitting: Family

## 2021-04-30 DIAGNOSIS — F411 Generalized anxiety disorder: Secondary | ICD-10-CM

## 2021-04-30 DIAGNOSIS — F321 Major depressive disorder, single episode, moderate: Secondary | ICD-10-CM

## 2021-05-16 ENCOUNTER — Other Ambulatory Visit: Payer: Self-pay

## 2021-05-16 DIAGNOSIS — F411 Generalized anxiety disorder: Secondary | ICD-10-CM

## 2021-05-19 ENCOUNTER — Other Ambulatory Visit: Payer: Self-pay

## 2021-05-19 DIAGNOSIS — F411 Generalized anxiety disorder: Secondary | ICD-10-CM

## 2021-05-19 MED ORDER — BUSPIRONE HCL 30 MG PO TABS: 30.0000 mg | ORAL_TABLET | Freq: Two times a day (BID) | ORAL | 0 refills | Status: DC

## 2021-05-26 ENCOUNTER — Ambulatory Visit: Payer: BC Managed Care – PPO | Admitting: Family

## 2021-05-29 ENCOUNTER — Other Ambulatory Visit: Payer: Self-pay

## 2021-05-29 DIAGNOSIS — F321 Major depressive disorder, single episode, moderate: Secondary | ICD-10-CM

## 2021-05-29 DIAGNOSIS — F411 Generalized anxiety disorder: Secondary | ICD-10-CM

## 2021-05-29 DIAGNOSIS — E785 Hyperlipidemia, unspecified: Secondary | ICD-10-CM

## 2021-05-29 MED ORDER — DULOXETINE HCL 60 MG PO CPEP: ORAL_CAPSULE | ORAL | 0 refills | Status: DC

## 2021-05-29 MED ORDER — ATORVASTATIN CALCIUM 20 MG PO TABS: 20.0000 mg | ORAL_TABLET | Freq: Every day | ORAL | 0 refills | Status: DC

## 2021-05-30 MED ORDER — DULOXETINE HCL 60 MG PO CPEP: ORAL_CAPSULE | ORAL | 0 refills | Status: DC

## 2021-05-30 MED ORDER — ATORVASTATIN CALCIUM 20 MG PO TABS: 20.0000 mg | ORAL_TABLET | Freq: Every day | ORAL | 0 refills | Status: DC

## 2021-06-13 ENCOUNTER — Encounter: Payer: Self-pay | Admitting: Family

## 2021-06-13 ENCOUNTER — Ambulatory Visit: Payer: BC Managed Care – PPO | Admitting: Family

## 2021-06-13 ENCOUNTER — Other Ambulatory Visit: Payer: Self-pay

## 2021-06-13 VITALS — BP 135/82 | HR 97 | Temp 98.0°F | Ht 64.0 in | Wt 162.6 lb

## 2021-06-13 DIAGNOSIS — Z23 Encounter for immunization: Secondary | ICD-10-CM | POA: Diagnosis not present

## 2021-06-13 DIAGNOSIS — E785 Hyperlipidemia, unspecified: Secondary | ICD-10-CM | POA: Diagnosis not present

## 2021-06-13 DIAGNOSIS — F411 Generalized anxiety disorder: Secondary | ICD-10-CM

## 2021-06-13 DIAGNOSIS — E663 Overweight: Secondary | ICD-10-CM

## 2021-06-13 DIAGNOSIS — K219 Gastro-esophageal reflux disease without esophagitis: Secondary | ICD-10-CM

## 2021-06-13 DIAGNOSIS — R682 Dry mouth, unspecified: Secondary | ICD-10-CM

## 2021-06-13 DIAGNOSIS — F321 Major depressive disorder, single episode, moderate: Secondary | ICD-10-CM | POA: Diagnosis not present

## 2021-06-13 MED ORDER — BUSPIRONE HCL 30 MG PO TABS: 30.0000 mg | ORAL_TABLET | Freq: Two times a day (BID) | ORAL | 2 refills | Status: DC

## 2021-06-13 MED ORDER — DESVENLAFAXINE SUCCINATE ER 100 MG PO TB24: 100.0000 mg | ORAL_TABLET | Freq: Every day | ORAL | 1 refills | Status: DC

## 2021-06-13 MED ORDER — ATORVASTATIN CALCIUM 20 MG PO TABS: 20.0000 mg | ORAL_TABLET | Freq: Every day | ORAL | 2 refills | Status: DC

## 2021-06-13 NOTE — Progress Notes (Signed)
Subjective:    Patient ID: Alejandra Mcmahon, female    DOB: 07-06-1976, 45 y.o.   MRN: [id number]  Chief Complaint  Patient presents with   Medical Management of Chronic Issues    Flu shot today    Pt presents to the office today for chronic follow up. Complaining of dry mouth too.  Gastroesophageal Reflux She complains of belching and heartburn. This is a chronic problem. The current episode started more than 1 year ago. The problem occurs occasionally. The problem has been waxing and waning. She has tried a PPI for the symptoms. The treatment provided moderate relief.  Anxiety Presents for follow-up visit. Symptoms include depressed mood, excessive worry, irritability, nervous/anxious behavior and restlessness.    Depression        This is a chronic problem.  The current episode started more than 1 year ago.   Associated symptoms include helplessness, irritable, restlessness, decreased interest and sad.  Associated symptoms include no hopelessness.  Past treatments include SNRIs - Serotonin and norepinephrine reuptake inhibitors.  Past medical history includes anxiety.      Review of Systems  Constitutional:  Positive for irritability.  Gastrointestinal:  Positive for heartburn.  Psychiatric/Behavioral:  Positive for depression. The patient is nervous/anxious.   All other systems reviewed and are negative.     Objective:   Physical Exam Vitals reviewed.  Constitutional:      General: She is irritable. She is not in acute distress.    Appearance: She is well-developed. She is obese.  HENT:     Head: Normocephalic and atraumatic.     Right Ear: Tympanic membrane normal.     Left Ear: Tympanic membrane normal.  Eyes:     Pupils: Pupils are equal, round, and reactive to light.  Neck:     Thyroid: No thyromegaly.  Cardiovascular:     Rate and Rhythm: Normal rate and regular rhythm.     Heart sounds: Normal heart sounds. No murmur heard. Pulmonary:     Effort: Pulmonary  effort is normal. No respiratory distress.     Breath sounds: Normal breath sounds. No wheezing.  Abdominal:     General: Bowel sounds are normal. There is no distension.     Palpations: Abdomen is soft.     Tenderness: There is no abdominal tenderness.  Musculoskeletal:        General: No tenderness. Normal range of motion.     Cervical back: Normal range of motion and neck supple.  Skin:    General: Skin is warm and dry.  Neurological:     Mental Status: She is alert and oriented to person, place, and time.     Cranial Nerves: No cranial nerve deficit.     Deep Tendon Reflexes: Reflexes are normal and symmetric.  Psychiatric:        Behavior: Behavior normal.        Thought Content: Thought content normal.        Judgment: Judgment normal.      BP 135/82   Pulse 97   Temp 98 F (36.7 C) (Temporal)   Ht 5\' 4"  (1.626 m)   Wt 162 lb 9.6 oz (73.8 kg)   BMI 27.91 kg/m      Assessment & Plan:  Alejandra Mcmahon comes in today with chief complaint of Medical Management of Chronic Issues (Flu shot today )   Diagnosis and orders addressed:  1. Current moderate episode of major depressive disorder, unspecified whether recurrent (HCC) Stop  Cymbalta 60 mg and start Pristiq 100 mg Stress management  RTO In 1-2 months to recheck - desvenlafaxine (PRISTIQ) 100 MG 24 hr tablet; Take 1 tablet (100 mg total) by mouth daily.  Dispense: 90 tablet; Refill: 1  2. GAD (generalized anxiety disorder) - busPIRone (BUSPAR) 30 MG tablet; Take 1 tablet (30 mg total) by mouth 2 (two) times daily. (Needs to be seen before next refill)  Dispense: 180 tablet; Refill: 2 - desvenlafaxine (PRISTIQ) 100 MG 24 hr tablet; Take 1 tablet (100 mg total) by mouth daily.  Dispense: 90 tablet; Refill: 1  3. Hyperlipidemia, unspecified hyperlipidemia type - atorvastatin (LIPITOR) 20 MG tablet; Take 1 tablet (20 mg total) by mouth daily. (NEEDS TO BE SEEN BEFORE NEXT REFILL)  Dispense: 90 tablet; Refill: 2  4.  Need for immunization against influenza - Flu Vaccine QUAD 53mo+IM (Fluarix, Fluzone & Alfiuria Quad PF)  5. Gastroesophageal reflux disease, unspecified whether esophagitis present  6. Overweight (BMI 25.0-29.9)  7. Dry mouth   Labs pending Health Maintenance reviewed Diet and exercise encouraged  Follow up plan: 1-2 months    Jannifer Rodney, FNP

## 2021-06-13 NOTE — Patient Instructions (Addendum)
Generalized Anxiety Disorder, Adult Generalized anxiety disorder (GAD) is a mental health condition. Unlike normal worries, anxiety related to GAD is not triggered by a specific event. These worries do not fade or get better with time. GAD interferes with relationships, work, and school. GAD symptoms can vary from mild to severe. People with severe GAD can have intense waves of anxiety with physical symptoms that are similar to panic attacks. What are the causes? The exact cause of GAD is not known, but the following are believed to have an impact: Differences in natural brain chemicals. Genes passed down from parents to children. Differences in the way threats are perceived. Development and stress during childhood. Personality. What increases the risk? The following factors may make you more likely to develop this condition: Being female. Having a family history of anxiety disorders. Being very shy. Experiencing very stressful life events, such as the death of a loved one. Having a very stressful family environment. What are the signs or symptoms? People with GAD often worry excessively about many things in their lives, such as their health and family. Symptoms may also include: Mental and emotional symptoms: Worrying excessively about natural disasters. Fear of being late. Difficulty concentrating. Fears that others are judging your performance. Physical symptoms: Fatigue. Headaches, muscle tension, muscle twitches, trembling, or feeling shaky. Feeling like your heart is pounding or beating very fast. Feeling out of breath or like you cannot take a deep breath. Having trouble falling asleep or staying asleep, or experiencing restlessness. Sweating. Nausea, diarrhea, or irritable bowel syndrome (IBS). Behavioral symptoms: Experiencing erratic moods or irritability. Avoidance of new situations. Avoidance of people. Extreme difficulty making decisions. How is this diagnosed? This  condition is diagnosed based on your symptoms and medical history. You will also have a physical exam. Your health care provider may perform tests to rule out other possible causes of your symptoms. To be diagnosed with GAD, a person must have anxiety that: Is out of his or her control. Affects several different aspects of his or her life, such as work and relationships. Causes distress that makes him or her unable to take part in normal activities. Includes at least three symptoms of GAD, such as restlessness, fatigue, trouble concentrating, irritability, muscle tension, or sleep problems. Before your health care provider can confirm a diagnosis of GAD, these symptoms must be present more days than they are not, and they must last for 6 months or longer. How is this treated? This condition may be treated with: Medicine. Antidepressant medicine is usually prescribed for Certain-term daily control. Anti-anxiety medicines may be added in severe cases, especially when panic attacks occur. Talk therapy (psychotherapy). Certain types of talk therapy can be helpful in treating GAD by providing support, education, and guidance. Options include: Cognitive behavioral therapy (CBT). People learn coping skills and self-calming techniques to ease their physical symptoms. They learn to identify unrealistic thoughts and behaviors and to replace them with more appropriate thoughts and behaviors. Acceptance and commitment therapy (ACT). This treatment teaches people how to be mindful as a way to cope with unwanted thoughts and feelings. Biofeedback. This process trains you to manage your body's response (physiological response) through breathing techniques and relaxation methods. You will work with a therapist while machines are used to monitor your physical symptoms. Stress management techniques. These include yoga, meditation, and exercise. A mental health specialist can help determine which treatment is best for you.  Some people see improvement with one type of therapy. However, other people require   a combination of therapies. Follow these instructions at home: Lifestyle Maintain a consistent routine and schedule. Anticipate stressful situations. Create a plan and allow extra time to work with your plan. Practice stress management or self-calming techniques that you have learned from your therapist or your health care provider. Exercise regularly and spend time outdoors. Eat a healthy diet that includes plenty of vegetables, fruits, whole grains, low-fat dairy products, and lean protein. Do not eat a lot of foods that are high in fat, added sugar, or salt (sodium). Drink plenty of water. Avoid alcohol. Alcohol can increase anxiety. Avoid caffeine and certain over-the-counter cold medicines. These may make you feel worse. Ask your pharmacist which medicines to avoid. General instructions Take over-the-counter and prescription medicines only as told by your health care provider. Understand that you are likely to have setbacks. Accept this and be kind to yourself as you persist to take better care of yourself. Anticipate stressful situations. Create a plan and allow extra time to work with your plan. Recognize and accept your accomplishments, even if you judge them as small. Spend time with people who care about you. Keep all follow-up visits. This is important. Where to find more information [employer] of [employer]: http://www.maynard.net/ Substance Abuse and Mental Health Services: [employer].pt Contact a health care provider if: Your symptoms do not get better. Your symptoms get worse. You have signs of depression, such as: A persistently sad or irritable mood. Loss of enjoyment in activities that used to bring you joy. Change in weight or eating. Changes in sleeping habits. Get help right away if: You have thoughts about hurting yourself or others. If you ever feel like you may hurt  yourself or others, or have thoughts about taking your own life, get help right away. Go to your nearest emergency department or: Call your local emergency services (911 in the [place] Call a suicide crisis helpline, such as the National Suicide Prevention Lifeline at (854)155-3972 or 988 in the [place] This is open 24 hours a day in the [place] Text the Crisis Text Line at 947-773-1998 (in the [place] Summary Generalized anxiety disorder (GAD) is a mental health condition that involves worry that is not triggered by a specific event. People with GAD often worry excessively about many things in their lives, such as their health and family. GAD may cause symptoms such as restlessness, trouble concentrating, sleep problems, frequent sweating, nausea, diarrhea, headaches, and trembling or muscle twitching. A mental health specialist can help determine which treatment is best for you. Some people see improvement with one type of therapy. However, other people require a combination of therapies. This information is not intended to replace advice given to you by your health care provider. Make sure you discuss any questions you have with your health care provider.   Sjogren's Syndrome Sjgren's syndrome is an inflammatory disease in which the body's disease-fighting system (immune system) attacks the glands that produce tears (lacrimal glands) and the glands that produce saliva (salivary glands). This makes the eyes and mouth very dry. Sjgren's syndrome can also affect other parts of the body, causing dryness of the skin, nose, throat, and vagina. Sjgren's syndrome is a long-term (chronic) disorder that has no cure. In some cases, it is linked to other disorders (rheumatic disorders), such as rheumatoid arthritis and systemic lupus erythematosus (SLE). It may affect other parts of the body, such as the: Blood vessels. Joints. Lungs. Kidneys. Liver or pancreas. Brain, nerves, or spinal cord. What are the  causes?  The cause of this condition is not known. It may be passed along from parent to child (inherited), or it may be a symptom of a rheumatic disorder. What increases the risk? This condition is more likely to develop in: Women. People who are 42-41 years old and older. People who have recently had a viral infection or currently have a viral infection. What are the signs or symptoms? The main symptoms of this condition are: Dry mouth. This may include: A chalky feeling. Difficulty swallowing, speaking, or tasting. Frequent cavities in the teeth. Frequent mouth infections. Dry eyes. This may include: Burning, redness, and itching. Blurry vision. Fluctuating vision. Light sensitivity. Other symptoms may include: Dryness of the skin and the inside of the nose. Eyelid infections. Vaginal dryness (if applicable). Joint pain and stiffness. Muscle pain and stiffness. How is this diagnosed? This condition is diagnosed based on: Your symptoms. Your medical history. A physical exam of your eyes and mouth. Tests, including: A Schirmer test. This tests your tear production. An eye exam that is done with a magnifying device (slit-lamp exam). An eye test that temporarily stains your eye with special dyes. This shows the extent of eye damage. Tests to check your salivary gland function. Biopsy. This is a removal of part of a salivary gland from inside your lower lip to be studied under a microscope. Chest X-rays. Blood or urine tests. How is this treated? There is no cure for this condition, but treatment can help you manage your symptoms. You may be asked to see a rheumatologist for further evaluation and treatment. This condition may be treated with: Medicines to help relieve pain and stiffness. Medicines to help relieve inflammation in your body (corticosteroids). These are usually for severe cases. Medicines to help reduce the activity of your immune system (immunosuppressants).  These are usually prescribed by your health care provider or a rheumatologist. Moisture replacement therapies to help relieve dryness in your skin, mouth, and eyes. Dry eyes may be treated with: Eye drops or nasal sprays to improve dryness of the eyes. Surgery or insertion of plugs to close the lacrimal glands (punctal occlusion). This helps keep more natural tears in your eyes. Soft contact lenses or hard scleral lenses. These are occasionally used to protect the surface of the eye. Biologic lubricating eye drops (serum tears). These are eye drops made from a person's own blood. They are used in some people with severe dry eye. Follow these instructions at home: Eye care  Use eye drops and other medicines as told by your health care provider. Protect your eyes from the sun and wind with sunglasses or glasses. Blink at least 5-6 times a minute. Maintain properly humidified air. You may want to use a humidifier at home and at work. Avoid smoke. Mouth care Brush your teeth and floss after every meal. Chew sugar-free gum or suck on hard candy. This may help to relieve dry mouth. Use antimicrobial mouthwash daily. Take frequent sips of water or sugar-free drinks. Use saliva substitutes or lip balm as told by your health care provider. See your dentist every 6 months. General instructions  Take over-the-counter and prescription medicines only as told by your health care provider. Drink enough fluid to keep your urine pale yellow. Keep all follow-up visits. This is important. Contact a health care provider if: You have a fever. You have night sweats. You are always tired. You have unexplained weight loss. You develop itchy skin. You have red patches on your skin. You have a  lump or swelling on your neck. Get help right away if: You develop severe eye pain. You develop sudden decreased vision. Summary Sjgren's syndrome is a disease in which the body's immune system attacks the glands  that produce tears and the glands that produce saliva. This condition makes the eyes and mouth very dry. Sjgren's syndrome is a long-term (chronic) disorder. There is no cure for this condition, but treatment can help you manage your symptoms. The cause of this condition is not known. You may be asked to see a rheumatologist for further evaluation and treatment. This information is not intended to replace advice given to you by your health care provider. Make sure you discuss any questions you have with your health care provider. Document Revised: 02/06/2021 Document Reviewed: 02/06/2021 Elsevier Patient Education  2022 [employer]  Document Revised: 02/01/2021 Document Reviewed: 10/30/2020 Elsevier Patient Education  2022 [employer].

## 2021-08-17 ENCOUNTER — Other Ambulatory Visit: Payer: Self-pay | Admitting: Family

## 2021-09-12 ENCOUNTER — Other Ambulatory Visit: Payer: Self-pay | Admitting: Family Medicine

## 2021-09-12 DIAGNOSIS — Z1231 Encounter for screening mammogram for malignant neoplasm of breast: Secondary | ICD-10-CM

## 2021-09-18 ENCOUNTER — Ambulatory Visit
Admission: RE | Admit: 2021-09-18 | Discharge: 2021-09-18 | Disposition: A | Discharge status: Home / Self Care | Payer: BC Managed Care – PPO | Source: Ambulatory Visit

## 2021-09-18 ENCOUNTER — Other Ambulatory Visit: Payer: Self-pay

## 2021-09-18 DIAGNOSIS — Z1231 Encounter for screening mammogram for malignant neoplasm of breast: Secondary | ICD-10-CM

## 2021-09-20 ENCOUNTER — Other Ambulatory Visit: Payer: Self-pay | Admitting: Family Medicine

## 2021-09-20 DIAGNOSIS — R928 Other abnormal and inconclusive findings on diagnostic imaging of breast: Secondary | ICD-10-CM

## 2021-09-25 ENCOUNTER — Ambulatory Visit: Payer: BC Managed Care – PPO | Admitting: Family

## 2021-09-25 ENCOUNTER — Encounter: Payer: Self-pay | Admitting: Family

## 2021-09-25 VITALS — BP 125/81 | HR 97 | Temp 98.5°F | Ht 64.0 in | Wt 171.8 lb

## 2021-09-25 DIAGNOSIS — F321 Major depressive disorder, single episode, moderate: Secondary | ICD-10-CM | POA: Diagnosis not present

## 2021-09-25 DIAGNOSIS — K219 Gastro-esophageal reflux disease without esophagitis: Secondary | ICD-10-CM

## 2021-09-25 DIAGNOSIS — F411 Generalized anxiety disorder: Secondary | ICD-10-CM | POA: Diagnosis not present

## 2021-09-25 DIAGNOSIS — E663 Overweight: Secondary | ICD-10-CM

## 2021-09-25 DIAGNOSIS — E785 Hyperlipidemia, unspecified: Secondary | ICD-10-CM

## 2021-09-25 DIAGNOSIS — Z0000 Encounter for general adult medical examination without abnormal findings: Secondary | ICD-10-CM

## 2021-09-25 DIAGNOSIS — Z0001 Encounter for general adult medical examination with abnormal findings: Secondary | ICD-10-CM | POA: Diagnosis not present

## 2021-09-25 LAB — LIPID PANEL

## 2021-09-25 MED ORDER — DULOXETINE HCL 60 MG PO CPEP: 60.0000 mg | ORAL_CAPSULE | Freq: Every day | ORAL | 1 refills | Status: DC

## 2021-09-25 MED ORDER — BUSPIRONE HCL 30 MG PO TABS: 30.0000 mg | ORAL_TABLET | Freq: Two times a day (BID) | ORAL | 2 refills | Status: DC

## 2021-09-25 NOTE — Patient Instructions (Signed)

## 2021-09-25 NOTE — Progress Notes (Signed)
? ?Subjective:  ? ? Patient ID: Alejandra Mcmahon, female    DOB: [birth year], 46 y.o.   MRN: [id number] ? ?Chief Complaint  ?Patient presents with  ? Medical Management of Chronic Issues  ?  Thirst no better. She thinks med made her gain wt. Stressed at work   ? ?Pt presents to the office today for CPE and chronic follow up. We changed her Cymbalta to Pristiq. She reports she has not noticed any benefit, however, if she forgets it by 12 hours she is having withdraws symptoms. She wants to go back to Cymbalta.  ?Depression ?       This is a chronic problem.  The current episode started more than 1 year ago.   The onset quality is gradual.   Associated symptoms include fatigue, helplessness, hopelessness, restlessness and sad.  Associated symptoms include not irritable.  Past treatments include SNRIs - Serotonin and norepinephrine reuptake inhibitors.  Past medical history includes anxiety.   ?Anxiety ?Presents for follow-up visit. Symptoms include depressed mood, excessive worry, irritability, nervous/anxious behavior, palpitations and restlessness. Patient reports no panic. Symptoms occur most days. The severity of symptoms is moderate.  ? ? ?Gastroesophageal Reflux ?She complains of belching and heartburn. This is a chronic problem. The current episode started more than 1 year ago. The problem occurs occasionally. Associated symptoms include fatigue. Risk factors include obesity. She has tried an antacid for the symptoms. The treatment provided moderate relief.  ?Hyperlipidemia ?This is a chronic problem. The current episode started more than 1 year ago. The problem is controlled. Current antihyperlipidemic treatment includes statins. The current treatment provides moderate improvement of lipids. Risk factors for coronary artery disease include dyslipidemia, hypertension, a sedentary lifestyle and post-menopausal.  ? ? ? ?Review of Systems  ?Constitutional:  Positive for fatigue and irritability.  ?Cardiovascular:   Positive for palpitations.  ?Gastrointestinal:  Positive for heartburn.  ?Psychiatric/Behavioral:  Positive for depression. The patient is nervous/anxious.   ?All other systems reviewed and are negative. ? ?Family History  ?Problem Relation Age of Onset  ? Cancer Mother   ?     breast  ? Breast cancer Mother   ? ?Social History  ? ?Socioeconomic History  ? Marital status: Married  ?  Spouse name: Not on file  ? Number of children: Not on file  ? Years of education: Not on file  ? Highest education level: Not on file  ?Occupational History  ? Not on file  ?Tobacco Use  ? Smoking status: Never  ? Smokeless tobacco: Never  ?Vaping Use  ? Vaping Use: Never used  ?Substance and Sexual Activity  ? Alcohol use: No  ? Drug use: No  ? Sexual activity: Not on file  ?Other Topics Concern  ? Not on file  ?Social History Narrative  ? Not on file  ? ?Social Determinants of Health  ? ?Financial Resource Strain: Not on file  ?Food Insecurity: Not on file  ?Transportation Needs: Not on file  ?Physical Activity: Not on file  ?Stress: Not on file  ?Social Connections: Not on file  ? ? ?   ?Objective:  ? Physical Exam ?Vitals reviewed.  ?Constitutional:   ?   General: She is not irritable.She is not in acute distress. ?   Appearance: She is well-developed.  ?HENT:  ?   Head: Normocephalic and atraumatic.  ?   Right Ear: Tympanic membrane normal.  ?   Left Ear: Tympanic membrane normal.  ?Eyes:  ?   Pupils:  Pupils are equal, round, and reactive to light.  ?Neck:  ?   Thyroid: No thyromegaly.  ?Cardiovascular:  ?   Rate and Rhythm: Normal rate and regular rhythm.  ?   Heart sounds: Normal heart sounds. No murmur heard. ?Pulmonary:  ?   Effort: Pulmonary effort is normal. No respiratory distress.  ?   Breath sounds: Normal breath sounds. No wheezing.  ?Abdominal:  ?   General: Bowel sounds are normal. There is no distension.  ?   Palpations: Abdomen is soft.  ?   Tenderness: There is no abdominal tenderness.  ?Musculoskeletal:     ?    General: No tenderness. Normal range of motion.  ?   Cervical back: Normal range of motion and neck supple.  ?Skin: ?   General: Skin is warm and dry.  ?Neurological:  ?   Mental Status: She is alert and oriented to person, place, and time.  ?   Cranial Nerves: No cranial nerve deficit.  ?   Deep Tendon Reflexes: Reflexes are normal and symmetric.  ?Psychiatric:     ?   Behavior: Behavior normal.     ?   Thought Content: Thought content normal.     ?   Judgment: Judgment normal.  ? ? ? ? ? ?  BP 125/81   Pulse 97   Temp 98.5 ?F (36.9 ?C) (Temporal)   Ht _0  (1.626 m)   Wt 171 lb 12.8 oz (77.9 kg)   BMI 29.49 kg/m?  ? ?Assessment & Plan:  ?Alejandra Mcmahon comes in today with chief complaint of Medical Management of Chronic Issues (Thirst no better. She thinks med made her gain wt. Stressed at work ) ? ? ?Diagnosis and orders addressed: ? ?1. Gastroesophageal reflux disease, unspecified whether esophagitis present ?- CMP14+EGFR ?- CBC with Differential/Platelet ? ?2. Current moderate episode of major depressive disorder, unspecified whether recurrent ([place]) ?Will change Pristiuq and restart Cymbalta 60 mg  ?Stress management  ?- DULoxetine (CYMBALTA) 60 MG capsule; Take 1 capsule (60 mg total) by mouth daily.  Dispense: 90 capsule; Refill: 1 ?- busPIRone (BUSPAR) 30 MG tablet; Take 1 tablet (30 mg total) by mouth 2 (two) times daily.  Dispense: 180 tablet; Refill: 2 ?- CMP14+EGFR ?- CBC with Differential/Platelet ? ?3. GAD (generalized anxiety disorder) ?- DULoxetine (CYMBALTA) 60 MG capsule; Take 1 capsule (60 mg total) by mouth daily.  Dispense: 90 capsule; Refill: 1 ?- busPIRone (BUSPAR) 30 MG tablet; Take 1 tablet (30 mg total) by mouth 2 (two) times daily.  Dispense: 180 tablet; Refill: 2 ?- CMP14+EGFR ?- CBC with Differential/Platelet ? ?4. Overweight (BMI 25.0-29.9) ?- CMP14+EGFR ?- CBC with Differential/Platelet ? ?5. Hyperlipidemia, unspecified hyperlipidemia type ?- CMP14+EGFR ?- CBC with  Differential/Platelet ?- Lipid panel ? ?6. Annual physical exam ?- CMP14+EGFR ?- CBC with Differential/Platelet ?- Lipid panel ?- TSH ? ? ?Labs pending ?Health Maintenance reviewed ?Diet and exercise encouraged ? ?Follow up plan: ?6 months  ? ? ?Evelina Dun, FNP ? ? ?

## 2021-09-26 LAB — CMP14+EGFR
ALT: 16 IU/L (ref 0–32)
AST: 19 IU/L (ref 0–40)
Albumin/Globulin Ratio: 1.5 (ref 1.2–2.2)
Albumin: 3.8 g/dL (ref 3.8–4.8)
Alkaline Phosphatase: 66 IU/L (ref 44–121)
BUN/Creatinine Ratio: 15 (ref 9–23)
BUN: 11 mg/dL (ref 6–24)
Bilirubin Total: <0.2 mg/dL (ref 0.0–1.2)
CO2: 21 mmol/L (ref 20–29)
Calcium: 9.5 mg/dL (ref 8.7–10.2)
Chloride: 104 mmol/L (ref 96–106)
Creatinine, Ser: 0.71 mg/dL (ref 0.57–1.00)
Globulin, Total: 2.5 g/dL (ref 1.5–4.5)
Glucose: 90 mg/dL (ref 70–99)
Potassium: 4.7 mmol/L (ref 3.5–5.2)
Sodium: 139 mmol/L (ref 134–144)
Total Protein: 6.3 g/dL (ref 6.0–8.5)
eGFR: 107 mL/min/{1.73_m2} (ref >59)

## 2021-09-26 LAB — CBC WITH DIFFERENTIAL/PLATELET
Basophils Absolute: 0.1 10*3/uL (ref 0.0–0.2)
Basos: 1 %
EOS (ABSOLUTE): 0 10*3/uL (ref 0.0–0.4)
Eos: 1 %
Hematocrit: 43.4 % (ref 34.0–46.6)
Hemoglobin: 14.4 g/dL (ref 11.1–15.9)
Immature Grans (Abs): 0 10*3/uL (ref 0.0–0.1)
Immature Granulocytes: 0 %
Lymphocytes Absolute: 1.4 10*3/uL (ref 0.7–3.1)
Lymphs: 29 %
MCH: 29.8 pg (ref 26.6–33.0)
MCHC: 33.2 g/dL (ref 31.5–35.7)
MCV: 90 fL (ref 79–97)
Monocytes Absolute: 0.5 10*3/uL (ref 0.1–0.9)
Monocytes: 11 %
Neutrophils Absolute: 3 10*3/uL (ref 1.4–7.0)
Neutrophils: 58 %
Platelets: 243 10*3/uL (ref 150–450)
RBC: 4.84 x10E6/uL (ref 3.77–5.28)
RDW: 11.7 % (ref 11.7–15.4)
WBC: 5 10*3/uL (ref 3.4–10.8)

## 2021-09-26 LAB — TSH: TSH: 1.9 u[IU]/mL (ref 0.450–4.500)

## 2021-09-26 LAB — LIPID PANEL
Chol/HDL Ratio: 2.6 ratio (ref 0.0–4.4)
Cholesterol, Total: 168 mg/dL (ref 100–199)
HDL: 65 mg/dL (ref >39)
LDL Chol Calc (NIH): 70 mg/dL (ref 0–99)
Triglycerides: 200 mg/dL — ABNORMAL HIGH (ref 0–149)
VLDL Cholesterol Cal: 33 mg/dL (ref 5–40)

## 2021-10-06 ENCOUNTER — Other Ambulatory Visit: Payer: Self-pay | Admitting: Family

## 2021-10-06 DIAGNOSIS — M545 Low back pain, unspecified: Secondary | ICD-10-CM

## 2021-10-09 ENCOUNTER — Other Ambulatory Visit: Payer: Self-pay

## 2021-10-09 ENCOUNTER — Ambulatory Visit
Admission: RE | Admit: 2021-10-09 | Discharge: 2021-10-09 | Disposition: A | Discharge status: Home / Self Care | Payer: BC Managed Care – PPO | Source: Ambulatory Visit | Attending: Family Medicine | Admitting: Family Medicine

## 2021-10-09 ENCOUNTER — Ambulatory Visit: Payer: BC Managed Care – PPO

## 2021-10-09 DIAGNOSIS — R928 Other abnormal and inconclusive findings on diagnostic imaging of breast: Secondary | ICD-10-CM

## 2021-12-04 ENCOUNTER — Other Ambulatory Visit: Payer: Self-pay | Admitting: Family

## 2021-12-04 DIAGNOSIS — F411 Generalized anxiety disorder: Secondary | ICD-10-CM

## 2021-12-04 DIAGNOSIS — F321 Major depressive disorder, single episode, moderate: Secondary | ICD-10-CM

## 2021-12-17 ENCOUNTER — Other Ambulatory Visit: Payer: Self-pay | Admitting: Family

## 2021-12-17 DIAGNOSIS — M545 Low back pain, unspecified: Secondary | ICD-10-CM

## 2022-01-22 ENCOUNTER — Other Ambulatory Visit: Payer: Self-pay | Admitting: Family

## 2022-02-27 ENCOUNTER — Other Ambulatory Visit: Payer: Self-pay | Admitting: Family

## 2022-02-27 DIAGNOSIS — F321 Major depressive disorder, single episode, moderate: Secondary | ICD-10-CM

## 2022-02-27 DIAGNOSIS — F411 Generalized anxiety disorder: Secondary | ICD-10-CM

## 2022-03-02 ENCOUNTER — Other Ambulatory Visit: Payer: Self-pay | Admitting: Family

## 2022-03-02 DIAGNOSIS — M545 Low back pain, unspecified: Secondary | ICD-10-CM

## 2022-03-02 NOTE — Telephone Encounter (Signed)
Last OV 09/25/21. Last RF 12/21/21. Next OV 03/02/22

## 2022-03-18 ENCOUNTER — Other Ambulatory Visit: Payer: Self-pay | Admitting: Family

## 2022-03-18 DIAGNOSIS — F321 Major depressive disorder, single episode, moderate: Secondary | ICD-10-CM

## 2022-03-18 DIAGNOSIS — F411 Generalized anxiety disorder: Secondary | ICD-10-CM

## 2022-03-23 ENCOUNTER — Other Ambulatory Visit: Payer: Self-pay | Admitting: Family

## 2022-03-23 DIAGNOSIS — F411 Generalized anxiety disorder: Secondary | ICD-10-CM

## 2022-03-23 DIAGNOSIS — F321 Major depressive disorder, single episode, moderate: Secondary | ICD-10-CM

## 2022-03-25 ENCOUNTER — Other Ambulatory Visit: Payer: Self-pay | Admitting: Family

## 2022-03-25 DIAGNOSIS — E785 Hyperlipidemia, unspecified: Secondary | ICD-10-CM

## 2022-03-27 ENCOUNTER — Other Ambulatory Visit: Payer: Self-pay | Admitting: Family

## 2022-03-27 DIAGNOSIS — E785 Hyperlipidemia, unspecified: Secondary | ICD-10-CM

## 2022-03-28 ENCOUNTER — Encounter: Payer: Self-pay | Admitting: Family

## 2022-03-28 ENCOUNTER — Other Ambulatory Visit: Payer: Self-pay | Admitting: Family

## 2022-03-28 DIAGNOSIS — E785 Hyperlipidemia, unspecified: Secondary | ICD-10-CM

## 2022-03-28 NOTE — Telephone Encounter (Signed)
Letter sent.

## 2022-03-28 NOTE — Telephone Encounter (Signed)
Needs an appointment for further refills at last office visit patient was suppose to make an appointment for 6 mths

## 2022-04-14 ENCOUNTER — Other Ambulatory Visit: Payer: Self-pay | Admitting: Family

## 2022-04-14 DIAGNOSIS — F321 Major depressive disorder, single episode, moderate: Secondary | ICD-10-CM

## 2022-04-14 DIAGNOSIS — F411 Generalized anxiety disorder: Secondary | ICD-10-CM

## 2022-04-19 ENCOUNTER — Other Ambulatory Visit: Payer: Self-pay | Admitting: Family

## 2022-04-19 DIAGNOSIS — F321 Major depressive disorder, single episode, moderate: Secondary | ICD-10-CM

## 2022-04-19 DIAGNOSIS — F411 Generalized anxiety disorder: Secondary | ICD-10-CM

## 2022-04-20 ENCOUNTER — Ambulatory Visit: Payer: BC Managed Care – PPO | Admitting: Family

## 2022-04-20 ENCOUNTER — Encounter: Payer: Self-pay | Admitting: Family

## 2022-04-20 VITALS — BP 112/80 | HR 105 | Temp 98.3°F | Ht 64.0 in | Wt 183.2 lb

## 2022-04-20 DIAGNOSIS — F411 Generalized anxiety disorder: Secondary | ICD-10-CM

## 2022-04-20 DIAGNOSIS — F321 Major depressive disorder, single episode, moderate: Secondary | ICD-10-CM

## 2022-04-20 DIAGNOSIS — E669 Obesity, unspecified: Secondary | ICD-10-CM

## 2022-04-20 DIAGNOSIS — Z23 Encounter for immunization: Secondary | ICD-10-CM | POA: Diagnosis not present

## 2022-04-20 DIAGNOSIS — E785 Hyperlipidemia, unspecified: Secondary | ICD-10-CM

## 2022-04-20 DIAGNOSIS — K219 Gastro-esophageal reflux disease without esophagitis: Secondary | ICD-10-CM | POA: Diagnosis not present

## 2022-04-20 DIAGNOSIS — Z1211 Encounter for screening for malignant neoplasm of colon: Secondary | ICD-10-CM

## 2022-04-20 MED ORDER — BUSPIRONE HCL 30 MG PO TABS: ORAL_TABLET | ORAL | 2 refills | Status: DC

## 2022-04-20 MED ORDER — ATORVASTATIN CALCIUM 20 MG PO TABS: ORAL_TABLET | ORAL | 3 refills | Status: DC

## 2022-04-20 MED ORDER — DULOXETINE HCL 60 MG PO CPEP: 60.0000 mg | ORAL_CAPSULE | Freq: Every day | ORAL | 2 refills | Status: DC

## 2022-04-20 NOTE — Patient Instructions (Signed)

## 2022-04-20 NOTE — Progress Notes (Signed)
Subjective:    Patient ID: Alejandra Mcmahon, female    DOB: June 16, 1976, 46 y.o.   MRN: [id number]  Chief Complaint  Patient presents with   Medical Management of Chronic Issues    Flu shot today    Pt presents to the office today for  chronic follow up. States she has constant thirst with the Cymbalta. She has tried Wellbutrin, Lexapro, Pristq, and Celexa.  Gastroesophageal Reflux She complains of belching and heartburn. This is a chronic problem. The current episode started more than 1 year ago. The problem occurs occasionally. The problem has been waxing and waning. Associated symptoms include fatigue. Risk factors include obesity. She has tried a PPI and an antacid for the symptoms. The treatment provided moderate relief.  Hyperlipidemia This is a chronic problem. The current episode started more than 1 year ago. The problem is controlled. Recent lipid tests were reviewed and are normal. Current antihyperlipidemic treatment includes statins. The current treatment provides moderate improvement of lipids. Risk factors for coronary artery disease include dyslipidemia, hypertension, post-menopausal and a sedentary lifestyle.  Anxiety Presents for follow-up visit. Symptoms include depressed mood, excessive worry, irritability, nervous/anxious behavior and restlessness. Symptoms occur most days. The severity of symptoms is moderate.    Depression        This is a chronic problem.  The current episode started more than 1 year ago.   The onset quality is gradual.   The problem occurs intermittently.  Associated symptoms include fatigue, restlessness and sad.  Associated symptoms include no helplessness and no hopelessness.  Past treatments include SNRIs - Serotonin and norepinephrine reuptake inhibitors.  Past medical history includes anxiety.       Review of Systems  Constitutional:  Positive for fatigue and irritability.  Gastrointestinal:  Positive for heartburn.  Psychiatric/Behavioral:   Positive for depression. The patient is nervous/anxious.   All other systems reviewed and are negative.      Objective:   Physical Exam Vitals reviewed.  Constitutional:      General: She is not in acute distress.    Appearance: She is well-developed.  HENT:     Head: Normocephalic and atraumatic.     Right Ear: Tympanic membrane normal.     Left Ear: Tympanic membrane normal.  Eyes:     Pupils: Pupils are equal, round, and reactive to light.  Neck:     Thyroid: No thyromegaly.  Cardiovascular:     Rate and Rhythm: Normal rate and regular rhythm.     Heart sounds: Normal heart sounds. No murmur heard. Pulmonary:     Effort: Pulmonary effort is normal. No respiratory distress.     Breath sounds: Normal breath sounds. No wheezing.  Abdominal:     General: Bowel sounds are normal. There is no distension.     Palpations: Abdomen is soft.     Tenderness: There is no abdominal tenderness.  Musculoskeletal:        General: No tenderness. Normal range of motion.     Cervical back: Normal range of motion and neck supple.  Skin:    General: Skin is warm and dry.  Neurological:     Mental Status: She is alert and oriented to person, place, and time.     Cranial Nerves: No cranial nerve deficit.     Deep Tendon Reflexes: Reflexes are normal and symmetric.  Psychiatric:        Behavior: Behavior normal.        Thought Content: Thought content normal.  Judgment: Judgment normal.       BP 112/80   Pulse (!) 105   Temp 98.3 F (36.8 C) (Temporal)   Ht '5\' 4"'  (1.626 m)   Wt 183 lb 3.2 oz (83.1 kg)   BMI 31.45 kg/m      Assessment & Plan:  Alejandra Mcmahon comes in today with chief complaint of Medical Management of Chronic Issues (Flu shot today )   Diagnosis and orders addressed:  1. Current moderate episode of major depressive disorder, unspecified whether recurrent ([facility]) - busPIRone (BUSPAR) 30 MG tablet; TAKE 1 TABLET BY MOUTH TWICE DAILY(NEEDS TO BE SEEN  BEFORE NEXT REFILL)  Dispense: 180 tablet; Refill: 2 - DULoxetine (CYMBALTA) 60 MG capsule; Take 1 capsule (60 mg total) by mouth daily.  Dispense: 90 capsule; Refill: 2 - CMP14+EGFR  2. GAD (generalized anxiety disorder) - busPIRone (BUSPAR) 30 MG tablet; TAKE 1 TABLET BY MOUTH TWICE DAILY(NEEDS TO BE SEEN BEFORE NEXT REFILL)  Dispense: 180 tablet; Refill: 2 - DULoxetine (CYMBALTA) 60 MG capsule; Take 1 capsule (60 mg total) by mouth daily.  Dispense: 90 capsule; Refill: 2 - CMP14+EGFR  3. Hyperlipidemia, unspecified hyperlipidemia type - atorvastatin (LIPITOR) 20 MG tablet; TAKE 1 TABLET( 20 MG TOTAL) BY MOUTH DAILY.( PATIENT NEEDS TO BE SEEN BEFORE NEXT REFILL)  Dispense: 90 tablet; Refill: 3 - CMP14+EGFR  4. Need for immunization against influenza - Flu Vaccine QUAD 83moIM (Fluarix, Fluzone & Alfiuria Quad PF) - CMP14+EGFR  5. Gastroesophageal reflux disease, unspecified whether esophagitis present - CMP14+EGFR  6. Obesity (BMI 30-39.9) - CMP14+EGFR  7. Colon cancer screening - Cologuard - CMP14+EGFR   Labs pending Genes[facility] screening completed today Health Maintenance reviewed Diet and exercise encouraged  Follow up plan: 6 months and we will have follow up visit to follow up on Alejandra Mcmahon

## 2022-04-21 LAB — CMP14+EGFR
ALT: 16 IU/L (ref 0–32)
AST: 17 IU/L (ref 0–40)
Albumin/Globulin Ratio: 1.5 (ref 1.2–2.2)
Albumin: 3.7 g/dL — ABNORMAL LOW (ref 3.9–4.9)
Alkaline Phosphatase: 61 IU/L (ref 44–121)
BUN/Creatinine Ratio: 9 (ref 9–23)
BUN: 7 mg/dL (ref 6–24)
Bilirubin Total: 0.3 mg/dL (ref 0.0–1.2)
CO2: 21 mmol/L (ref 20–29)
Calcium: 8.6 mg/dL — ABNORMAL LOW (ref 8.7–10.2)
Chloride: 103 mmol/L (ref 96–106)
Creatinine, Ser: 0.77 mg/dL (ref 0.57–1.00)
Globulin, Total: 2.4 g/dL (ref 1.5–4.5)
Glucose: 89 mg/dL (ref 70–99)
Potassium: 4.5 mmol/L (ref 3.5–5.2)
Sodium: 138 mmol/L (ref 134–144)
Total Protein: 6.1 g/dL (ref 6.0–8.5)
eGFR: 97 mL/min/{1.73_m2} (ref >59)

## 2022-04-26 ENCOUNTER — Other Ambulatory Visit: Payer: Self-pay | Admitting: Family Medicine

## 2022-04-27 ENCOUNTER — Ambulatory Visit: Payer: BC Managed Care – PPO | Admitting: Family

## 2022-05-01 ENCOUNTER — Telehealth: Payer: Self-pay | Admitting: Family

## 2022-05-01 NOTE — Telephone Encounter (Signed)
Called left message patient needs to schedule visit with pcp for genesight testing results it can be virtural

## 2022-05-02 NOTE — Telephone Encounter (Signed)
Have called patient multiple times and left Voice mail can not get in touch with patient will close encounter

## 2022-05-12 LAB — COLOGUARD: COLOGUARD: NEGATIVE

## 2022-05-15 ENCOUNTER — Ambulatory Visit (INDEPENDENT_AMBULATORY_CARE_PROVIDER_SITE_OTHER): Payer: BC Managed Care – PPO | Admitting: Family

## 2022-05-15 ENCOUNTER — Encounter: Payer: Self-pay | Admitting: Family

## 2022-05-15 DIAGNOSIS — F411 Generalized anxiety disorder: Secondary | ICD-10-CM

## 2022-05-15 DIAGNOSIS — F321 Major depressive disorder, single episode, moderate: Secondary | ICD-10-CM | POA: Diagnosis not present

## 2022-05-15 NOTE — Patient Instructions (Signed)

## 2022-05-15 NOTE — Progress Notes (Signed)
Virtual Visit  Note Due to COVID-19 pandemic this visit was conducted virtually. This visit type was conducted due to national recommendations for restrictions regarding the COVID-19 Pandemic (e.g. social distancing, sheltering in place) in an effort to limit this patient's exposure and mitigate transmission in our community. All issues noted in this document were discussed and addressed.  A physical exam was not performed with this format.  I connected with Alejandra Mcmahon on 05/15/22 at 3:50 pm pm by telephone and verified that I am speaking with the correct person using two identifiers. Alejandra Mcmahon is currently located at home and no one is currently with her during visit. The provider, Jannifer Rodney, FNP is located in their office at time of visit.  I discussed the limitations, risks, security and privacy concerns of performing an evaluation and management service by telephone and the availability of in person appointments. I also discussed with the patient that there may be a patient responsible charge related to this service. The patient expressed understanding and agreed to proceed.  Alejandra Mcmahon, Alejandra Mcmahon are scheduled for a virtual visit with your provider today.    Just as we do with appointments in the office, we must obtain your consent to participate.  Your consent will be active for this visit and any virtual visit you may have with one of our providers in the next 365 days.    If you have a MyChart account, I can also send a copy of this consent to you electronically.  All virtual visits are billed to your insurance company just like a traditional visit in the office.  As this is a virtual visit, video technology does not allow for your provider to perform a traditional examination.  This may limit your provider's ability to fully assess your condition.  If your provider identifies any concerns that need to be evaluated in person or the need to arrange testing such as labs, EKG, etc, we  will make arrangements to do so.    Although advances in technology are sophisticated, we cannot ensure that it will always work on either your end or our end.  If the connection with a video visit is poor, we may have to switch to a telephone visit.  With either a video or telephone visit, we are not always able to ensure that we have a secure connection.   I need to obtain your verbal consent now.   Are you willing to proceed with your visit today?   Alejandra Mcmahon has provided verbal consent on 05/15/2022 for a virtual visit (video or telephone).   Jannifer Rodney, [place] 05/15/2022  3:58 PM   History and Present Illness:  PT calls the office today to discuss Gene[facility] testing. She reports her anxiety is increased, but believes a lot of this is related to her job. She is currently looking for a new job.  Anxiety Presents for follow-up visit. Symptoms include excessive worry, irritability, nervous/anxious behavior and restlessness. Symptoms occur occasionally. The severity of symptoms is mild.    Depression        This is a chronic problem.  The current episode started more than 1 year ago.   The problem occurs intermittently.  Associated symptoms include irritable, restlessness and sad.  Associated symptoms include no helplessness and no hopelessness.  Past treatments include SNRIs - Serotonin and norepinephrine reuptake inhibitors.  Past medical history includes anxiety.      Review of Systems  Constitutional:  Positive for irritability.  Psychiatric/Behavioral:  Positive for depression. The patient is nervous/anxious.   All other systems reviewed and are negative.    Observations/Objective: No SOB or distress noted   Assessment and Plan: 1. GAD (generalized anxiety disorder)   2. Current moderate episode of major depressive disorder, unspecified whether recurrent ([place])   Discussed Genesight results Reports she currently feels happy on her Cymbalta dose. She is complaining  of dry mouth and eyes. I am unsure if this is coming from medications or sjogren's syndrome Will hold off on any medications changes at this time, she is currently looking for a new job.  Follow up as needed    I discussed the assessment and treatment plan with the patient. The patient was provided an opportunity to ask questions and all were answered. The patient agreed with the plan and demonstrated an understanding of the instructions.   The patient was advised to call back or seek an in-person evaluation if the symptoms worsen or if the condition fails to improve as anticipated.  The above assessment and management plan was discussed with the patient. The patient verbalized understanding of and has agreed to the management plan. Patient is aware to call the clinic if symptoms persist or worsen. Patient is aware when to return to the clinic for a follow-up visit. Patient educated on when it is appropriate to go to the emergency department.   Time call ended:  4:02 pm   I provided 12 minutes of  non face-to-face time during this encounter.    Evelina Dun, FNP

## 2022-05-17 ENCOUNTER — Other Ambulatory Visit: Payer: Self-pay | Admitting: Family

## 2022-05-17 DIAGNOSIS — F411 Generalized anxiety disorder: Secondary | ICD-10-CM

## 2022-05-17 DIAGNOSIS — F321 Major depressive disorder, single episode, moderate: Secondary | ICD-10-CM

## 2022-08-27 ENCOUNTER — Other Ambulatory Visit: Payer: Self-pay | Admitting: Family

## 2022-08-27 DIAGNOSIS — Z1231 Encounter for screening mammogram for malignant neoplasm of breast: Secondary | ICD-10-CM

## 2022-08-30 IMAGING — MG MM DIGITAL DIAGNOSTIC UNILAT*L* W/ TOMO W/ CAD
6 series · 6 of 18 positions shown · non-contrast
Comparison: Previous exam(s).

CLINICAL DATA: Possible asymmetry in the slightly outer left breast
on a recent screening mammogram. Her mother was diagnosed with
breast cancer at age 40.

EXAM:
DIGITAL DIAGNOSTIC UNILATERAL LEFT MAMMOGRAM WITH TOMOSYNTHESIS AND
CAD
TECHNIQUE: Left digital diagnostic mammography and breast tomosynthesis was
performed. The images were evaluated with computer-aided detection.

[L CC synth-2D (1 of 2)]
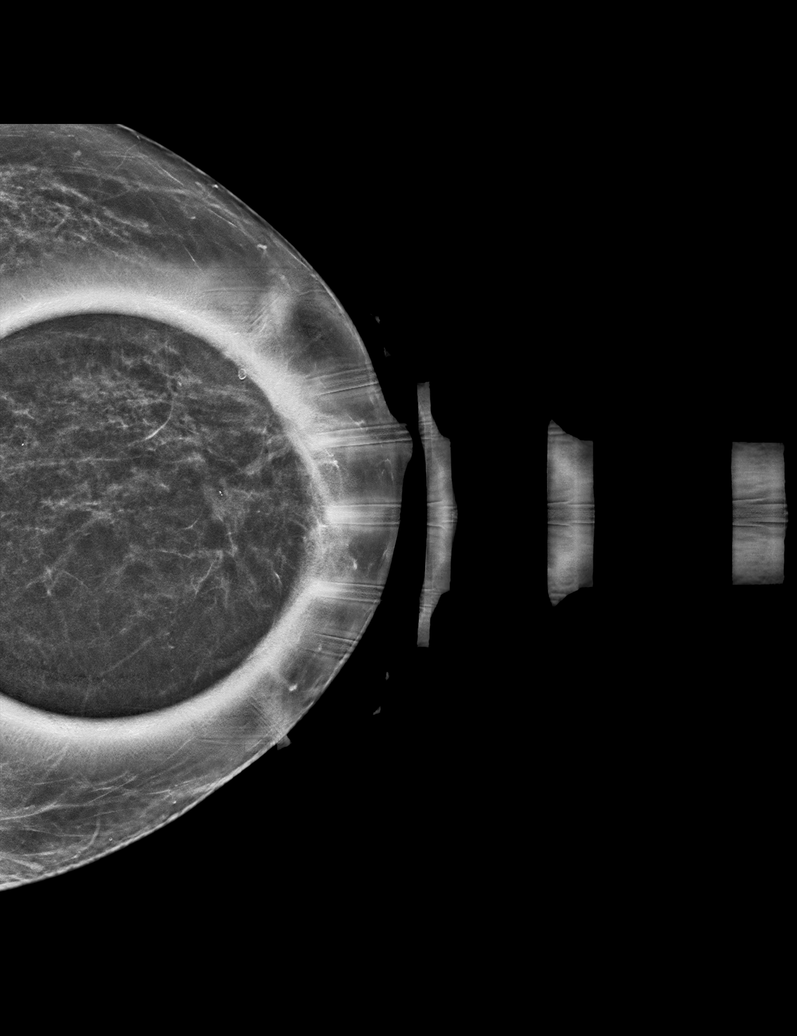

[L ML synth-2D]
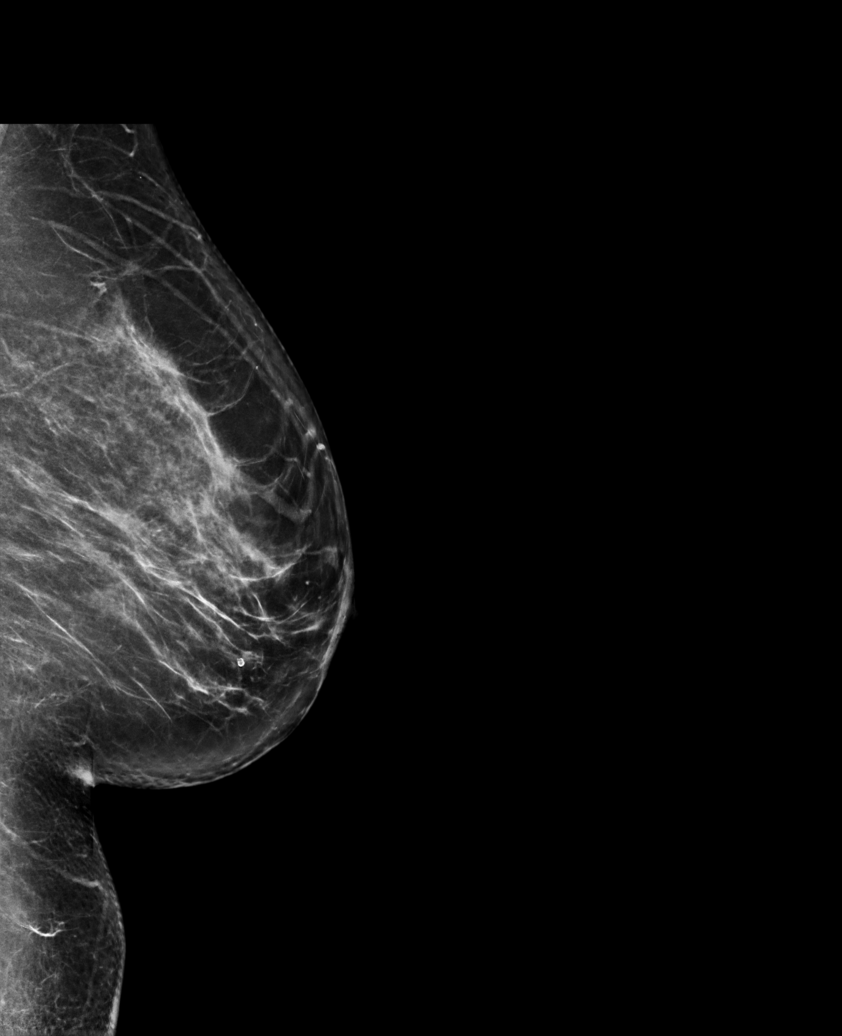

[L CC synth-2D (2 of 2)]
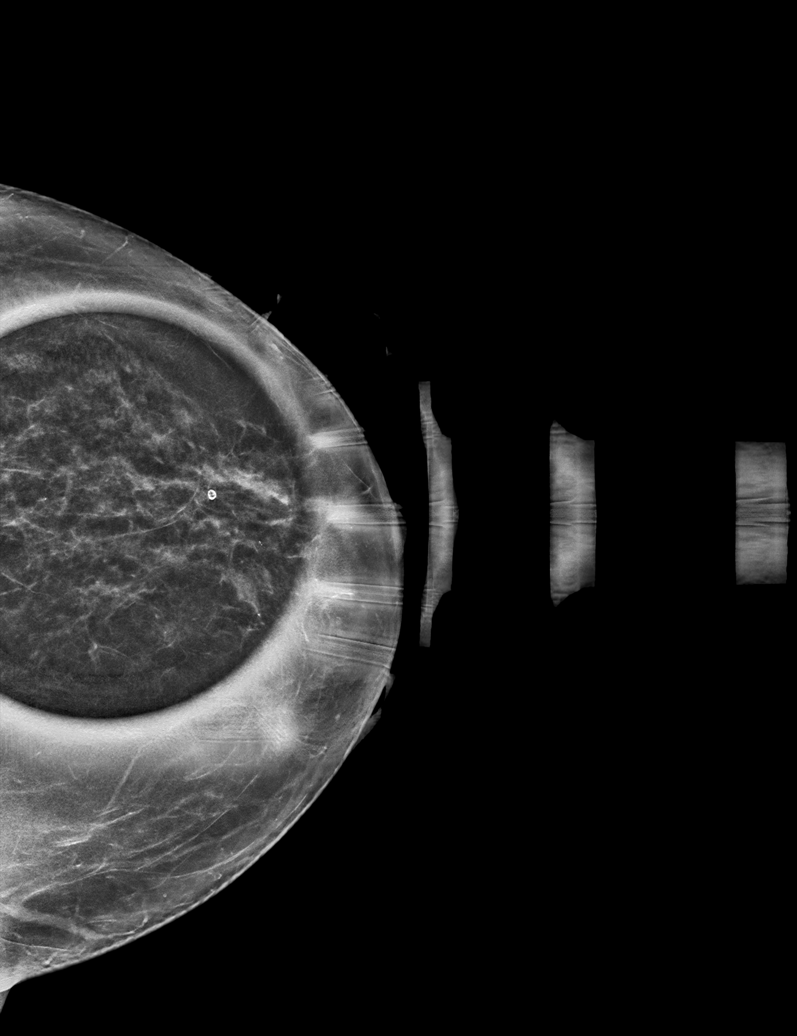

[L CC tomo (1 of 2) · tomo slice 31/61.0]
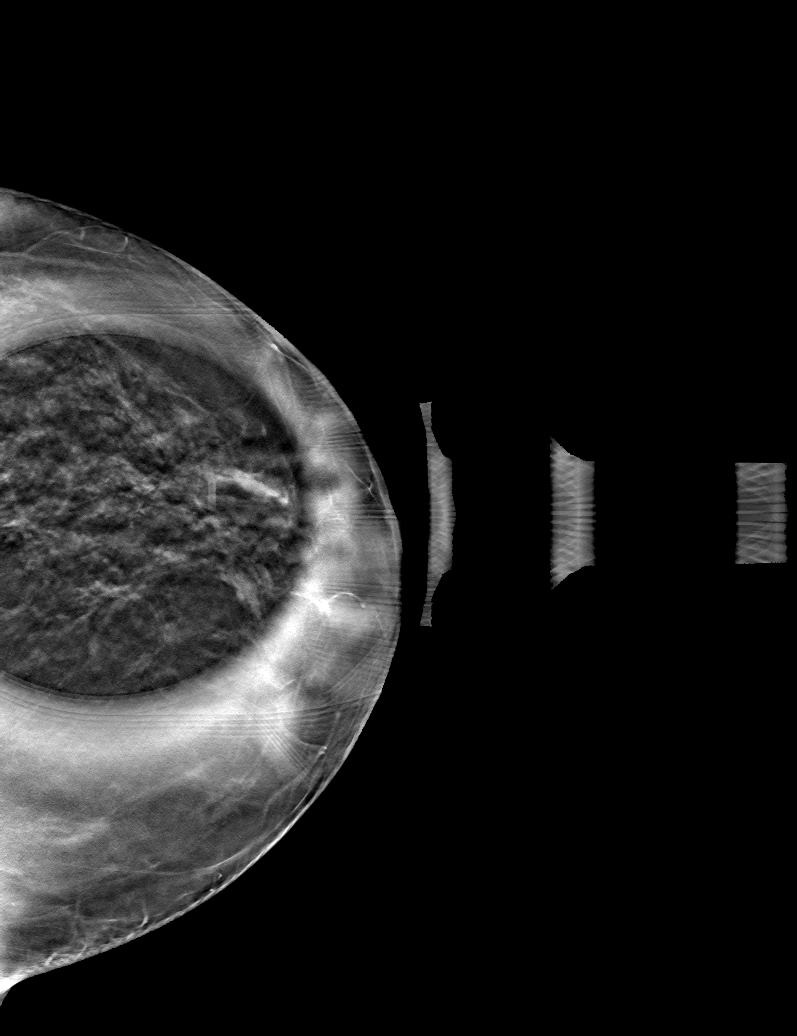

[L CC tomo (2 of 2) · tomo slice 29/58.0]
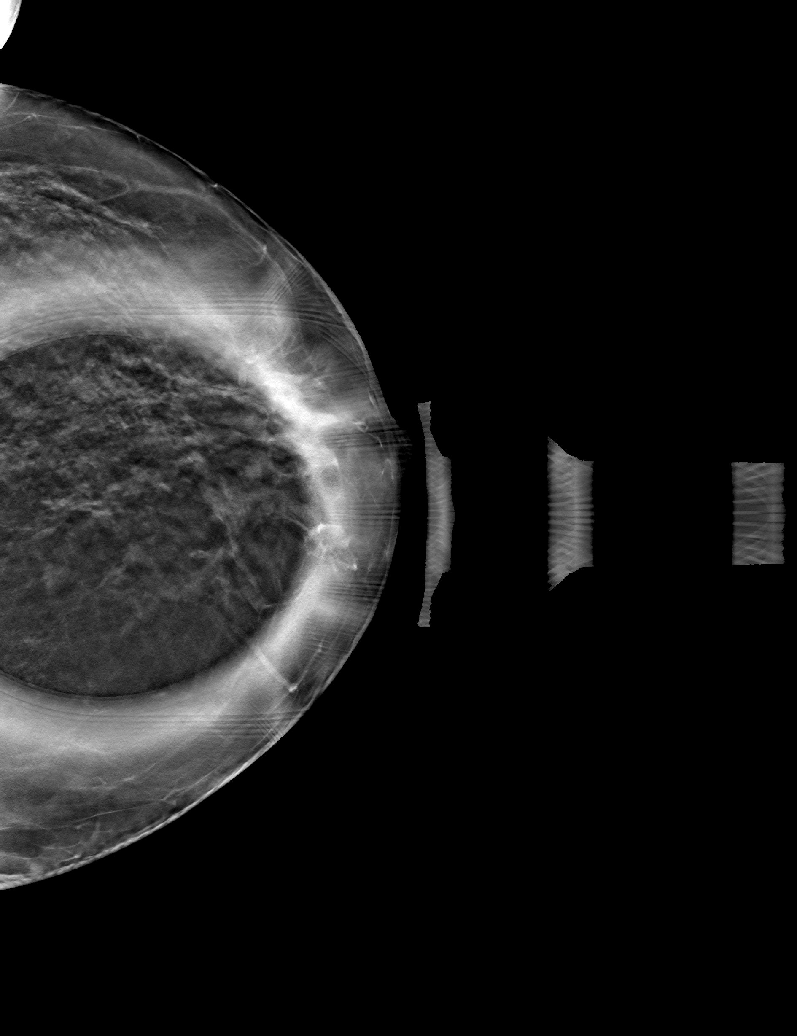

[L ML tomo · tomo slice 40/79.0]
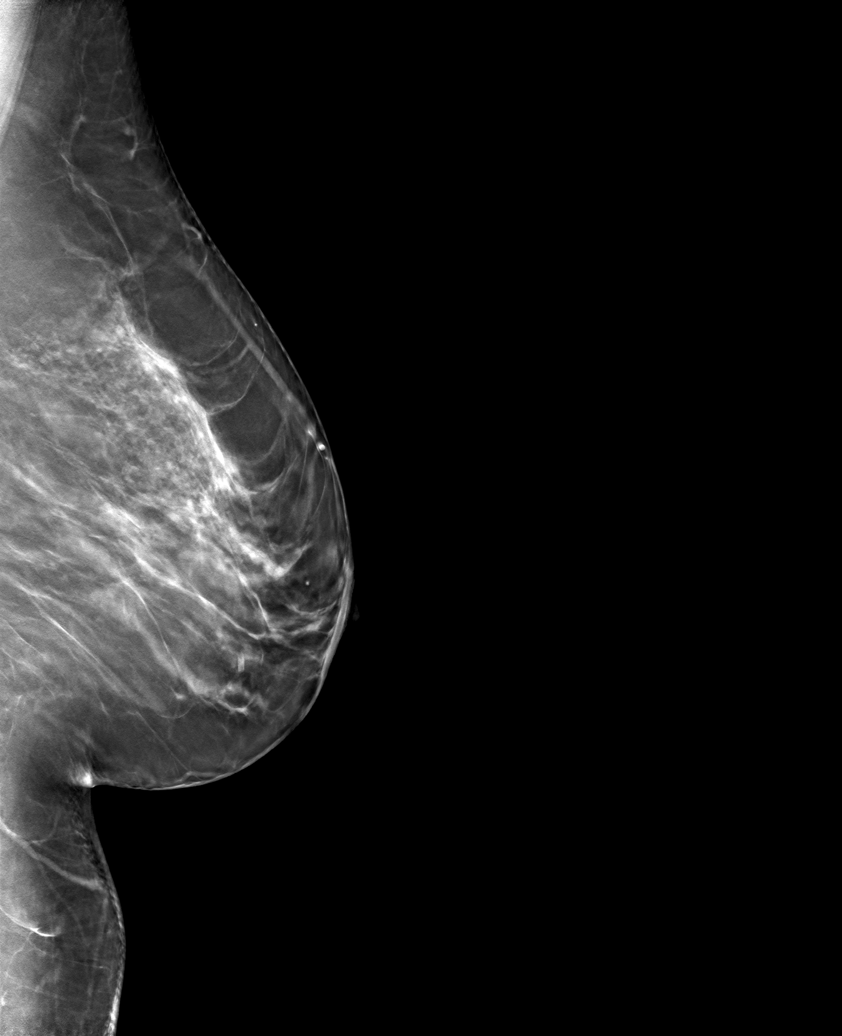

[6 of 18 positions shown; findings below may reference images not displayed]

ACR Breast Density Category c: The breast tissue is heterogeneously
dense, which may obscure small masses.
FINDINGS: 3D tomographic and 2D generated true lateral and spot compression
craniocaudal images of the left breast demonstrate normal appearing
fibroglandular tissue at the location of the recently suspected
asymmetry, unchanged compared to previous examinations.
IMPRESSION: No evidence of malignancy. The recently suspected left breast
asymmetry was close apposition of normal breast tissue.

RECOMMENDATION:
Bilateral screening mammogram August 2022 when due.

I have discussed the findings and recommendations with the patient.
If applicable, a reminder letter will be sent to the patient
regarding the next appointment.

BI-RADS CATEGORY  1: Negative.

## 2022-09-12 ENCOUNTER — Other Ambulatory Visit: Payer: Self-pay | Admitting: Family

## 2022-09-13 ENCOUNTER — Encounter: Payer: Self-pay | Admitting: Family

## 2022-09-13 NOTE — Telephone Encounter (Signed)
[facility] NTBS for PE in March, last PE 09/25/21. RF SENT to mail order pharmacy

## 2022-09-13 NOTE — Telephone Encounter (Signed)
LMTCB TO SCHEDULE APPT  LETTER MAILED

## 2022-10-12 ENCOUNTER — Ambulatory Visit
Admission: RE | Admit: 2022-10-12 | Discharge: 2022-10-12 | Disposition: A | Discharge status: Home / Self Care | Payer: Commercial Managed Care - PPO | Source: Ambulatory Visit

## 2022-10-12 ENCOUNTER — Ambulatory Visit: Payer: Self-pay | Admitting: Family

## 2022-10-12 DIAGNOSIS — Z1231 Encounter for screening mammogram for malignant neoplasm of breast: Secondary | ICD-10-CM

## 2022-10-15 ENCOUNTER — Ambulatory Visit: Payer: Commercial Managed Care - PPO | Admitting: Family

## 2022-10-15 ENCOUNTER — Encounter: Payer: Self-pay | Admitting: Family

## 2022-10-15 VITALS — BP 128/78 | HR 113 | Temp 98.7°F | Ht 64.0 in | Wt 180.0 lb

## 2022-10-15 DIAGNOSIS — F321 Major depressive disorder, single episode, moderate: Secondary | ICD-10-CM

## 2022-10-15 DIAGNOSIS — E785 Hyperlipidemia, unspecified: Secondary | ICD-10-CM

## 2022-10-15 DIAGNOSIS — Z23 Encounter for immunization: Secondary | ICD-10-CM | POA: Diagnosis not present

## 2022-10-15 DIAGNOSIS — K219 Gastro-esophageal reflux disease without esophagitis: Secondary | ICD-10-CM

## 2022-10-15 DIAGNOSIS — M545 Low back pain, unspecified: Secondary | ICD-10-CM

## 2022-10-15 DIAGNOSIS — Z0000 Encounter for general adult medical examination without abnormal findings: Secondary | ICD-10-CM

## 2022-10-15 DIAGNOSIS — F411 Generalized anxiety disorder: Secondary | ICD-10-CM | POA: Diagnosis not present

## 2022-10-15 DIAGNOSIS — Z3041 Encounter for surveillance of contraceptive pills: Secondary | ICD-10-CM

## 2022-10-15 DIAGNOSIS — Z0001 Encounter for general adult medical examination with abnormal findings: Secondary | ICD-10-CM

## 2022-10-15 DIAGNOSIS — G8929 Other chronic pain: Secondary | ICD-10-CM

## 2022-10-15 DIAGNOSIS — E669 Obesity, unspecified: Secondary | ICD-10-CM

## 2022-10-15 MED ORDER — BUSPIRONE HCL 30 MG PO TABS: ORAL_TABLET | ORAL | 2 refills | Status: DC

## 2022-10-15 MED ORDER — NORGESTIMATE-ETH ESTRADIOL 0.25-35 MG-MCG PO TABS: ORAL_TABLET | ORAL | 4 refills | Status: DC

## 2022-10-15 MED ORDER — CYCLOBENZAPRINE HCL 10 MG PO TABS: 10.0000 mg | ORAL_TABLET | Freq: Three times a day (TID) | ORAL | 2 refills | Status: DC

## 2022-10-15 MED ORDER — DULOXETINE HCL 60 MG PO CPEP: 60.0000 mg | ORAL_CAPSULE | Freq: Every day | ORAL | 2 refills | Status: DC

## 2022-10-15 MED ORDER — ATORVASTATIN CALCIUM 20 MG PO TABS: ORAL_TABLET | ORAL | 3 refills | Status: DC

## 2022-10-15 NOTE — Progress Notes (Signed)
Subjective:    Patient ID: Alejandra Mcmahon, female    DOB: 20-Feb-1976, 47 y.o.   MRN: [id number]  Chief Complaint  Patient presents with   Medical Management of Chronic Issues   Pt presents to the office today for  CPE and chronic follow up. States she has constant thirst with the Cymbalta. She has tried Wellbutrin, Lexapro, Pristq, and Celexa.   She is taking OC and last menstrual cycle was 09/29/22. Currently not smoking.  Anxiety Presents for follow-up visit. Symptoms include depressed mood, excessive worry, irritability, nervous/anxious behavior, panic and restlessness. Symptoms occur most days. The severity of symptoms is moderate.    Depression        This is a chronic problem.  The current episode started more than 1 year ago.   The problem occurs intermittently.  Associated symptoms include helplessness, hopelessness, restlessness and sad.  Past treatments include SNRIs - Serotonin and norepinephrine reuptake inhibitors.  Past medical history includes anxiety.   Hyperlipidemia This is a chronic problem. The current episode started more than 1 year ago. Exacerbating diseases include obesity. Current antihyperlipidemic treatment includes statins. The current treatment provides moderate improvement of lipids. Risk factors for coronary artery disease include dyslipidemia, hypertension and a sedentary lifestyle.  Back Pain This is a chronic problem. The current episode started more than 1 year ago. The problem occurs intermittently. The problem has been waxing and waning since onset. The pain is present in the lumbar spine. The quality of the pain is described as aching. The pain is at a severity of 6/10. The pain is moderate.      Review of Systems  Constitutional:  Positive for irritability.  Musculoskeletal:  Positive for back pain.  Psychiatric/Behavioral:  Positive for depression. The patient is nervous/anxious.   All other systems reviewed and are negative.  Family History   Problem Relation Age of Onset   Cancer Mother        breast   Breast cancer Mother    Social History   Socioeconomic History   Marital status: Married    Spouse name: Not on file   Number of children: Not on file   Years of education: Not on file   Highest education level: Not on file  Occupational History   Not on file  Tobacco Use   Smoking status: Never   Smokeless tobacco: Never  Vaping Use   Vaping Use: Never used  Substance and Sexual Activity   Alcohol use: No   Drug use: No   Sexual activity: Not on file  Other Topics Concern   Not on file  Social History Narrative   Not on file   Social Determinants of Health   Financial Resource Strain: Not on file  Food Insecurity: Not on file  Transportation Needs: Not on file  Physical Activity: Not on file  Stress: Not on file  Social Connections: Not on file       Objective:   Physical Exam Vitals reviewed.  Constitutional:      General: She is not in acute distress.    Appearance: She is well-developed.  HENT:     Head: Normocephalic and atraumatic.     Right Ear: Tympanic membrane normal.     Left Ear: Tympanic membrane normal.  Eyes:     Pupils: Pupils are equal, round, and reactive to light.  Neck:     Thyroid: No thyromegaly.  Cardiovascular:     Rate and Rhythm: Normal rate and regular rhythm.  Heart sounds: Normal heart sounds. No murmur heard. Pulmonary:     Effort: Pulmonary effort is normal. No respiratory distress.     Breath sounds: Normal breath sounds. No wheezing.  Abdominal:     General: Bowel sounds are normal. There is no distension.     Palpations: Abdomen is soft.     Tenderness: There is no abdominal tenderness.  Musculoskeletal:        General: No tenderness. Normal range of motion.     Cervical back: Normal range of motion and neck supple.  Skin:    General: Skin is warm and dry.  Neurological:     Mental Status: She is alert and oriented to person, place, and time.      Cranial Nerves: No cranial nerve deficit.     Deep Tendon Reflexes: Reflexes are normal and symmetric.  Psychiatric:        Behavior: Behavior normal.        Thought Content: Thought content normal.        Judgment: Judgment normal.       BP 128/78   Pulse (!) 113   Temp 98.7 F (37.1 C) (Temporal)   Ht 5\' 4"  (1.626 m)   Wt 180 lb (81.6 kg)   SpO2 100%   BMI 30.90 kg/m      Assessment & Plan:  Alejandra Mcmahon comes in today with chief complaint of Medical Management of Chronic Issues   Diagnosis and orders addressed:  1. Hyperlipidemia, unspecified hyperlipidemia type - atorvastatin (LIPITOR) 20 MG tablet; TAKE 1 TABLET( 20 MG TOTAL) BY MOUTH DAILY.( PATIENT NEEDS TO BE SEEN BEFORE NEXT REFILL)  Dispense: 90 tablet; Refill: 3 - CMP14+EGFR - CBC with Differential/Platelet  2. GAD (generalized anxiety disorder) - busPIRone (BUSPAR) 30 MG tablet; TAKE 1 TABLET BY MOUTH TWICE DAILY  Dispense: 60 tablet; Refill: 2 - DULoxetine (CYMBALTA) 60 MG capsule; Take 1 capsule (60 mg total) by mouth daily.  Dispense: 90 capsule; Refill: 2 - CMP14+EGFR - CBC with Differential/Platelet  3. Current moderate episode of major depressive disorder, unspecified whether recurrent (HCC)  - busPIRone (BUSPAR) 30 MG tablet; TAKE 1 TABLET BY MOUTH TWICE DAILY  Dispense: 60 tablet; Refill: 2 - DULoxetine (CYMBALTA) 60 MG capsule; Take 1 capsule (60 mg total) by mouth daily.  Dispense: 90 capsule; Refill: 2 - CMP14+EGFR - CBC with Differential/Platelet  4. Chronic bilateral low back pain without sciatica - cyclobenzaprine (FLEXERIL) 10 MG tablet; Take 1 tablet (10 mg total) by mouth 3 (three) times daily.  Dispense: 90 tablet; Refill: 2 - CMP14+EGFR - CBC with Differential/Platelet  5. Gastroesophageal reflux disease, unspecified whether esophagitis present - CMP14+EGFR - CBC with Differential/Platelet  6. Obesity (BMI 30-39.9) - CMP14+EGFR - CBC with Differential/Platelet  7. Encounter  for surveillance of contraceptive pills - norgestimate-ethinyl estradiol (MILI) 0.25-35 MG-MCG tablet; ( ALTERNATE FOR ESTARYLLA) TAKE 1 TABLET BY MOUTH DAILY  Dispense: 84 tablet; Refill: 4 - CMP14+EGFR - CBC with Differential/Platelet  8. Annual physical exam - CMP14+EGFR - CBC with Differential/Platelet - Lipid panel - TSH   Labs pending Health Maintenance reviewed Diet and exercise encouraged  Follow up plan: 6 months    Evelina Dun, FNP

## 2022-10-15 NOTE — Patient Instructions (Signed)

## 2022-10-16 LAB — CMP14+EGFR
ALT: 15 IU/L (ref 0–32)
AST: 15 IU/L (ref 0–40)
Albumin/Globulin Ratio: 1.6 (ref 1.2–2.2)
Albumin: 3.9 g/dL (ref 3.9–4.9)
Alkaline Phosphatase: 63 IU/L (ref 44–121)
BUN/Creatinine Ratio: 12 (ref 9–23)
BUN: 9 mg/dL (ref 6–24)
Bilirubin Total: 0.3 mg/dL (ref 0.0–1.2)
CO2: 19 mmol/L — ABNORMAL LOW (ref 20–29)
Calcium: 9.1 mg/dL (ref 8.7–10.2)
Chloride: 102 mmol/L (ref 96–106)
Creatinine, Ser: 0.73 mg/dL (ref 0.57–1.00)
Globulin, Total: 2.4 g/dL (ref 1.5–4.5)
Glucose: 88 mg/dL (ref 70–99)
Potassium: 4.8 mmol/L (ref 3.5–5.2)
Sodium: 137 mmol/L (ref 134–144)
Total Protein: 6.3 g/dL (ref 6.0–8.5)
eGFR: 103 mL/min/{1.73_m2} (ref >59)

## 2022-10-16 LAB — LIPID PANEL
Chol/HDL Ratio: 2.6 ratio (ref 0.0–4.4)
Cholesterol, Total: 188 mg/dL (ref 100–199)
HDL: 73 mg/dL (ref >39)
LDL Chol Calc (NIH): 87 mg/dL (ref 0–99)
Triglycerides: 165 mg/dL — ABNORMAL HIGH (ref 0–149)
VLDL Cholesterol Cal: 28 mg/dL (ref 5–40)

## 2022-10-16 LAB — CBC WITH DIFFERENTIAL/PLATELET
Basophils Absolute: 0.1 10*3/uL (ref 0.0–0.2)
Basos: 1 %
EOS (ABSOLUTE): 0.2 10*3/uL (ref 0.0–0.4)
Eos: 2 %
Hematocrit: 42.8 % (ref 34.0–46.6)
Hemoglobin: 14.1 g/dL (ref 11.1–15.9)
Immature Grans (Abs): 0 10*3/uL (ref 0.0–0.1)
Immature Granulocytes: 0 %
Lymphocytes Absolute: 1.9 10*3/uL (ref 0.7–3.1)
Lymphs: 23 %
MCH: 29.5 pg (ref 26.6–33.0)
MCHC: 32.9 g/dL (ref 31.5–35.7)
MCV: 90 fL (ref 79–97)
Monocytes Absolute: 0.4 10*3/uL (ref 0.1–0.9)
Monocytes: 5 %
Neutrophils Absolute: 5.6 10*3/uL (ref 1.4–7.0)
Neutrophils: 69 %
Platelets: 303 10*3/uL (ref 150–450)
RBC: 4.78 x10E6/uL (ref 3.77–5.28)
RDW: 11.8 % (ref 11.7–15.4)
WBC: 8.1 10*3/uL (ref 3.4–10.8)

## 2022-10-16 LAB — TSH: TSH: 1.43 u[IU]/mL (ref 0.450–4.500)

## 2022-11-04 ENCOUNTER — Other Ambulatory Visit: Payer: Self-pay | Admitting: Family

## 2022-11-04 DIAGNOSIS — F411 Generalized anxiety disorder: Secondary | ICD-10-CM

## 2022-11-04 DIAGNOSIS — F321 Major depressive disorder, single episode, moderate: Secondary | ICD-10-CM

## 2022-11-07 ENCOUNTER — Ambulatory Visit (INDEPENDENT_AMBULATORY_CARE_PROVIDER_SITE_OTHER): Payer: Commercial Managed Care - PPO

## 2022-11-07 ENCOUNTER — Ambulatory Visit: Payer: Commercial Managed Care - PPO | Admitting: Family Medicine

## 2022-11-07 ENCOUNTER — Encounter: Payer: Self-pay | Admitting: Family Medicine

## 2022-11-07 VITALS — BP 129/83 | HR 100 | Temp 98.9°F | Ht 64.0 in | Wt 184.4 lb

## 2022-11-07 DIAGNOSIS — M542 Cervicalgia: Secondary | ICD-10-CM

## 2022-11-07 MED ORDER — PREDNISONE 10 MG PO TABS: ORAL_TABLET | ORAL | 0 refills | Status: DC

## 2022-11-07 NOTE — Progress Notes (Signed)
Subjective:  Patient ID: Alejandra Mcmahon, female    DOB: 02-Mar-1976  Age: 47 y.o. MRN: [id number]  CC: Neck Pain   HPI Alejandra Mcmahon presents for neck pain. Onset on awakening 5 days ago. Worsening since. Spread to shoulds and upper back. Difficult to rotate or flex  head. Left arm feels weak at times. The left arm Gets tired faster. Lifts boxes at work that can be heavy. NKI. Cyclobenzaprine only helped the first night. No relief with OTC aleve, tylenol or ibuprofen.      11/07/2022    2:36 PM 10/15/2022   10:39 AM 04/20/2022    9:01 AM  Depression screen PHQ 2/9  Decreased Interest 0 1 0  Down, Depressed, Hopeless PHQ - 2 Score Altered sleeping Tired, decreased energy Change in appetite 1 0 2  Feeling bad or failure about yourself  0 3 2  Trouble concentrating Moving slowly or fidgety/restless 0 0 0  Suicidal thoughts 0 0 0  PHQ-9 Score Difficult doing work/chores Not difficult at all Somewhat difficult Somewhat difficult    History Alejandra Mcmahon has a past medical history of Allergy, Depression, and GERD (gastroesophageal reflux disease).   Alejandra Mcmahon has a past surgical history that includes Cesarean section.   Alejandra Mcmahon family history includes Breast cancer in Alejandra Mcmahon mother; Cancer in Alejandra Mcmahon mother.Alejandra Mcmahon reports that Alejandra Mcmahon has never smoked. Alejandra Mcmahon has never used smokeless tobacco. Alejandra Mcmahon reports that Alejandra Mcmahon does not drink alcohol and does not use drugs.    ROS Review of Systems  Constitutional:  Positive for activity change.  HENT:  Positive for voice change.   Musculoskeletal:  Positive for arthralgias (LUE) and neck pain.    Objective:  BP 129/83   Pulse 100   Temp 98.9 F (37.2 C)   Ht  (1.626 m)   Wt 184 lb 6.4 oz (83.6 kg)   SpO2 96%   BMI 31.65 kg/m   BP Readings from Last 3 Encounters:  11/07/22 129/83  10/15/22 128/78  04/20/22 112/80    Wt Readings from Last 3 Encounters:  11/07/22 184 lb 6.4 oz (83.6 kg)  10/15/22 180 lb  (81.6 kg)  04/20/22 183 lb 3.2 oz (83.1 kg)     Physical Exam Constitutional:      General: Alejandra Mcmahon is not in acute distress.    Appearance: Normal appearance. Alejandra Mcmahon is not toxic-appearing.  HENT:     Head: Normocephalic and atraumatic.  Neck:     Vascular: No carotid bruit.  Musculoskeletal:        General: Tenderness (posterior neck and left shoulder) present. No swelling or deformity. Normal range of motion.     Cervical back: Tenderness present.  Lymphadenopathy:     Cervical: No cervical adenopathy.  Skin:    General: Skin is warm and dry.  Neurological:     Mental Status: Alejandra Mcmahon is alert.       Assessment & Plan:   Alejandra Mcmahon was seen today for neck pain.  Diagnoses and all orders for this visit:  Neck pain -     DG Cervical Spine Complete; Future  Other orders -     predniSONE (DELTASONE) 10 MG tablet; Take 5 daily for 3 days followed by 4,3,2 and 1 for 3 days each.       I am having Alejandra Mcmahon start on predniSONE. I am also  having Alejandra Mcmahon maintain Alejandra Mcmahon triamcinolone cream, atorvastatin, busPIRone, cyclobenzaprine, DULoxetine, and norgestimate-ethinyl estradiol.  Allergies as of 11/07/2022       Reactions   Amoxicillin    Celexa [citalopram Hydrobromide] Nausea Only   Wellbutrin [bupropion]         Medication List        Accurate as of November 07, 2022  3:00 PM. If you have any questions, ask your nurse or doctor.          atorvastatin 20 MG tablet Commonly known as: LIPITOR TAKE 1 TABLET( 20 MG TOTAL) BY MOUTH DAILY.( PATIENT NEEDS TO BE SEEN BEFORE NEXT REFILL)   busPIRone 30 MG tablet Commonly known as: BUSPAR TAKE 1 TABLET BY MOUTH TWICE DAILY   cyclobenzaprine 10 MG tablet Commonly known as: FLEXERIL Take 1 tablet (10 mg total) by mouth 3 (three) times daily.   DULoxetine 60 MG capsule Commonly known as: CYMBALTA Take 1 capsule (60 mg total) by mouth daily.   norgestimate-ethinyl estradiol 0.25-35 MG-MCG tablet Commonly known as:  Mili ( ALTERNATE FOR ESTARYLLA) TAKE 1 TABLET BY MOUTH DAILY   predniSONE 10 MG tablet Commonly known as: DELTASONE Take 5 daily for 3 days followed by 4,3,2 and 1 for 3 days each. Started by: Mechele Claude, MD   triamcinolone cream 0.1 % Commonly known as: KENALOG Apply 1 application topically 2 (two) times daily.         Follow-up: Return if symptoms worsen or fail to improve.  Mechele Claude, M.D.

## 2022-11-09 ENCOUNTER — Encounter: Payer: Self-pay | Admitting: Family Medicine

## 2022-11-15 ENCOUNTER — Other Ambulatory Visit: Payer: Self-pay | Admitting: Family

## 2022-11-15 DIAGNOSIS — Z3041 Encounter for surveillance of contraceptive pills: Secondary | ICD-10-CM

## 2022-12-05 ENCOUNTER — Ambulatory Visit: Payer: Commercial Managed Care - PPO | Admitting: Family Medicine

## 2022-12-05 ENCOUNTER — Encounter: Payer: Self-pay | Admitting: Family Medicine

## 2022-12-05 VITALS — BP 122/83 | HR 112 | Temp 98.3°F | Ht 64.0 in | Wt 186.6 lb

## 2022-12-05 DIAGNOSIS — M5412 Radiculopathy, cervical region: Secondary | ICD-10-CM | POA: Diagnosis not present

## 2022-12-05 MED ORDER — PREDNISONE 20 MG PO TABS
ORAL_TABLET | ORAL | 0 refills | Status: DC
Start: 2022-12-05 — End: 2022-12-25

## 2022-12-05 NOTE — Progress Notes (Signed)
Subjective:  Patient ID: Alejandra Mcmahon, female    DOB: 1976-04-03  Age: 47 y.o. MRN: [id number]  CC: No chief complaint on file.   HPI Alejandra Mcmahon presents for neck pain starting on 11/02/22. It got better on prednisone, but came back once off of the prednisone. Worst first thing in the morning. Stiff all day. Goes to shoulders and to nuchal ridge. Pain ranges 6-8/10.      12/05/2022    3:48 PM 11/07/2022    2:36 PM 10/15/2022   10:39 AM  Depression screen PHQ 2/9  Decreased Interest 1 0 1  Down, Depressed, Hopeless 1 1 2   PHQ - 2 Score 2 1 3   Altered sleeping 3 2 3   Tired, decreased energy 2 2 2   Change in appetite 1 1 0  Feeling bad or failure about yourself  2 0 3  Trouble concentrating 2 2 2   Moving slowly or fidgety/restless 1 0 0  Suicidal thoughts 0 0 0  PHQ-9 Score 13 8 13   Difficult doing work/chores Somewhat difficult Not difficult at all Somewhat difficult    History Alejandra Mcmahon has a past medical history of Allergy, Depression, and GERD (gastroesophageal reflux disease).   Alejandra Mcmahon has a past surgical history that includes Cesarean section.   Her family history includes Breast cancer in her mother; Cancer in her mother.Alejandra Mcmahon reports that Alejandra Mcmahon has never smoked. Alejandra Mcmahon has never used smokeless tobacco. Alejandra Mcmahon reports that Alejandra Mcmahon does not drink alcohol and does not use drugs.    ROS Review of Systems  Constitutional: Negative.   HENT: Negative.    Eyes:  Negative for visual disturbance.  Respiratory:  Negative for shortness of breath.   Cardiovascular:  Negative for chest pain.  Gastrointestinal:  Negative for abdominal pain.  Musculoskeletal:  Negative for arthralgias.    Objective:  BP 122/83   Pulse (!) 112   Temp 98.3 F (36.8 C)   Ht 5\' 4"  (1.626 m)   Wt 186 lb 9.6 oz (84.6 kg)   SpO2 97%   BMI 32.03 kg/m   BP Readings from Last 3 Encounters:  12/05/22 122/83  11/07/22 129/83  10/15/22 128/78    Wt Readings from Last 3 Encounters:  12/05/22 186 lb 9.6  oz (84.6 kg)  11/07/22 184 lb 6.4 oz (83.6 kg)  10/15/22 180 lb (81.6 kg)     Physical Exam Constitutional:      Appearance: Alejandra Mcmahon is well-developed.  HENT:     Head: Normocephalic.  Cardiovascular:     Rate and Rhythm: Normal rate and regular rhythm.     Heart sounds: No murmur heard. Pulmonary:     Effort: Pulmonary effort is normal.     Breath sounds: Normal breath sounds.  Musculoskeletal:        General: Tenderness (posterior neck and superior trapezius bilaterally) present.       Assessment & Plan:   Diagnoses and all orders for this visit:  Cervical radiculopathy at C6 -     MR CERVICAL SPINE WO CONTRAST; Future -     Ambulatory referral to Physical Therapy  Other orders -     predniSONE (DELTASONE) 20 MG tablet; One twice daily with food for 2 weeks. Then one daily for 2 weeks       I have discontinued Alejandra Mcmahon's predniSONE. I am also having her start on predniSONE. Additionally, I am having her maintain her triamcinolone cream, atorvastatin, busPIRone, cyclobenzaprine, DULoxetine, and norgestimate-ethinyl estradiol.  Allergies as of  12/05/2022       Reactions   Amoxicillin    Celexa [citalopram Hydrobromide] Nausea Only   Wellbutrin [bupropion]         Medication List        Accurate as of Dec 05, 2022 11:59 PM. If you have any questions, ask your nurse or doctor.          atorvastatin 20 MG tablet Commonly known as: LIPITOR TAKE 1 TABLET( 20 MG TOTAL) BY MOUTH DAILY.( PATIENT NEEDS TO BE SEEN BEFORE NEXT REFILL)   busPIRone 30 MG tablet Commonly known as: BUSPAR TAKE 1 TABLET BY MOUTH TWICE DAILY   cyclobenzaprine 10 MG tablet Commonly known as: FLEXERIL Take 1 tablet (10 mg total) by mouth 3 (three) times daily.   DULoxetine 60 MG capsule Commonly known as: CYMBALTA Take 1 capsule (60 mg total) by mouth daily.   norgestimate-ethinyl estradiol 0.25-35 MG-MCG tablet Commonly known as: Mili ( ALTERNATE FOR ESTARYLLA) TAKE 1  TABLET BY MOUTH DAILY   predniSONE 20 MG tablet Commonly known as: DELTASONE One twice daily with food for 2 weeks. Then one daily for 2 weeks What changed:  medication strength additional instructions Changed by: Mechele Claude, MD   triamcinolone cream 0.1 % Commonly known as: KENALOG Apply 1 application topically 2 (two) times daily.         Follow-up: Return if symptoms worsen or fail to improve.  Mechele Claude, M.D.

## 2022-12-11 ENCOUNTER — Encounter: Payer: Self-pay | Admitting: Family Medicine

## 2022-12-25 ENCOUNTER — Encounter: Payer: Self-pay | Admitting: Family

## 2022-12-25 ENCOUNTER — Telehealth: Payer: Commercial Managed Care - PPO | Admitting: Family

## 2022-12-25 DIAGNOSIS — F410 Panic disorder [episodic paroxysmal anxiety] without agoraphobia: Secondary | ICD-10-CM | POA: Diagnosis not present

## 2022-12-25 DIAGNOSIS — F411 Generalized anxiety disorder: Secondary | ICD-10-CM | POA: Diagnosis not present

## 2022-12-25 DIAGNOSIS — F321 Major depressive disorder, single episode, moderate: Secondary | ICD-10-CM

## 2022-12-25 MED ORDER — HYDROXYZINE PAMOATE 25 MG PO CAPS
25.0000 mg | ORAL_CAPSULE | Freq: Three times a day (TID) | ORAL | 1 refills | Status: DC | PRN
Start: 2022-12-25 — End: 2023-03-15

## 2022-12-25 MED ORDER — DULOXETINE HCL 30 MG PO CPEP
90.0000 mg | ORAL_CAPSULE | Freq: Every day | ORAL | 1 refills | Status: DC
Start: 2022-12-25 — End: 2023-06-12

## 2022-12-25 NOTE — Patient Instructions (Signed)
Panic Attack A panic attack is a sudden episode of severe anxiety, fear, or discomfort that causes physical and emotional symptoms. A panic attack may be in response to something frightening, or it may occur for no known reason. Symptoms of a panic attack can be similar to symptoms of a heart attack or stroke. It is important to see your health care provider when you have a panic attack so that these conditions can be ruled out. What are the causes? A panic attack may be caused by: An extreme, life-threatening situation, such as a war or natural disaster. An anxiety disorder, such as post-traumatic stress disorder. Depression. Panic disorder. Certain medical conditions, including heart problems, neurological conditions, and infections. Other causes may include: Certain over-the-counter and prescription medicines. Supplements that increase anxiety. Illegal drugs that increase heart rate and blood pressure, such as methamphetamine. What increases the risk? You are more likely to develop this condition if: You have another mental health condition. You use alcohol, illegal drugs, or other substances. You are under extreme stress. A life event is causing increased feelings of anxiety and depression. What are the signs or symptoms? A panic attack starts suddenly, usually lasts 5-10 minutes, and occurs with one or more of the following: A pounding heart, or a feeling that your heart is beating irregularly or faster than normal (palpitations). Sweating, trembling, or shaking. Shortness of breath, feeling smothered, or feeling choked. Chest pain or discomfort. Nausea or a strange feeling in your stomach. Dizziness, feeling light-headed, or feeling like you might faint. Other symptoms may include: Chills or hot flashes. Numbness or tingling in your lips, hands, or feet. Feeling confused, or feeling that you are not yourself. Fear of losing control or of being emotionally unstable, or fear of  dying. How is this diagnosed? A panic attack is diagnosed with an assessment by your health care provider. During the assessment, your health care provider will ask questions about: Your history of anxiety, depression, and panic attacks. Your medical history. Whether you drink alcohol, use drugs, take supplements, or take medicines. Be honest about your substance use. Your health care provider may also: Order blood tests or other kinds of tests to rule out serious medical conditions. Refer you to a mental health professional for further evaluation. How is this treated? A panic attack is a symptom of another condition. Treatment depends on the cause of the panic attack. If the cause is a medical problem, your health care provider will treat that problem or refer you to a specialist. If the cause is emotional, you may be given anti-anxiety medicines or referred to a counselor. Anti-anxiety medicines may reduce how often attacks happen, reduce how severe the attacks are, and lower anxiety. If the cause is a medicine, your health care provider may tell you to stop the medicine, change your dose, or take a different medicine. If the cause is an illegal drug, treatment may involve letting the drug wear off and taking medicine to help the drug leave your body or to stop its effects. Attacks caused by heavy drug use may continue even if you stop using the drug. Most panic attacks go away with treatment of the underlying problem. If you have panic attacks often, you may have a condition called panic disorder. Follow these instructions at home: Alcohol use Do not drink alcohol if: Your health care provider tells you not to drink. You are pregnant, may be pregnant, or are planning to become pregnant. If you drink alcohol: Limit how much  you have to: 0-1 drink a day for women. 0-2 drinks a day for men. Know how much alcohol is in your drink. In the [place] one drink equals one 12 oz bottle of beer (355  mL), one 5 oz glass of wine (148 mL), or one 1 oz glass of hard liquor (44 mL). General instructions Take over-the-counter and prescription medicines only as told by your health care provider. If you feel anxious, limit your caffeine intake. Take good care of your physical and mental health by: Eating a balanced diet that includes plenty of fresh fruits and vegetables, whole grains, lean meats, and low-fat dairy. Getting plenty of rest. Try to get 7-8 hours of uninterrupted sleep each night. Exercising regularly. Try to get 30 minutes of physical activity at least 5 days a week. Do not use any products that contain nicotine or tobacco. These products include cigarettes, chewing tobacco, and vaping devices, such as e-cigarettes. If you need help quitting, ask your health care provider. Keep all follow-up visits. This is important. Panic attacks may have underlying physical or emotional problems that take time to accurately diagnose. Where to find more information [facility]): [employer] [facility]): http://www.maynard.net/ Contact a health care provider if: Your symptoms do not improve, or they get worse. You are not able to take your medicine as prescribed because of side effects. Get help right away if: You have thoughts about hurting yourself or others. Get help right away if you feel like you may hurt yourself or others, or have thoughts about taking your own life. Go to your nearest emergency room or: Call 911. Call the National Suicide Prevention Lifeline at 321-658-1125 or 988. This is open 24 hours a day. Text the Crisis Text Line at 223-640-6540. Summary A panic attack is a sudden episode of severe anxiety, fear, or discomfort that causes physical and emotional symptoms. Always see a health care provider to have the reasons for the panic attack correctly diagnosed. If your panic attack was caused by a  physical problem, follow your health care provider's suggestions for medicine, referral to a specialist, and lifestyle changes. If your panic attack was caused by an emotional problem, follow through with counseling from a qualified mental health specialist. If you feel like you may hurt yourself or others, call 911 and get help right away. This information is not intended to replace advice given to you by your health care provider. Make sure you discuss any questions you have with your health care provider. Document Revised: 02/16/2021 Document Reviewed: 02/16/2021 Elsevier Patient Education  2024 [employer].

## 2022-12-25 NOTE — Telephone Encounter (Signed)
Appt made

## 2022-12-25 NOTE — Progress Notes (Signed)
Virtual Visit Consent   Alejandra Mcmahon, you are scheduled for a virtual visit with a [facility] provider today. Just as with appointments in the office, your consent must be obtained to participate. Your consent will be active for this visit and any virtual visit you may have with one of our providers in the next 365 days. If you have a MyChart account, a copy of this consent can be sent to you electronically.  As this is a virtual visit, video technology does not allow for your provider to perform a traditional examination. This may limit your provider's ability to fully assess your condition. If your provider identifies any concerns that need to be evaluated in person or the need to arrange testing (such as labs, EKG, etc.), we will make arrangements to do so. Although advances in technology are sophisticated, we cannot ensure that it will always work on either your end or our end. If the connection with a video visit is poor, the visit may have to be switched to a telephone visit. With either a video or telephone visit, we are not always able to ensure that we have a secure connection.  By engaging in this virtual visit, you consent to the provision of healthcare and authorize for your insurance to be billed (if applicable) for the services provided during this visit. Depending on your insurance coverage, you may receive a charge related to this service.  I need to obtain your verbal consent now. Are you willing to proceed with your visit today? Alejandra Mcmahon has provided verbal consent on 12/25/2022 for a virtual visit (video or telephone). Jannifer Rodney, FNP  Date: 12/25/2022 3:41 PM  Virtual Visit via Video Note   I, Jannifer Rodney, connected with  Alejandra Mcmahon  ([id number], Mar 21, 1976) on 12/25/22 at  1:55 PM EDT by a video-enabled telemedicine application and verified that I am speaking with the correct person using two identifiers.  Location: Patient: Virtual Visit Location Patient:  Other: work Provider: Pharmacist, community: Office/Clinic   I discussed the limitations of evaluation and management by telemedicine and the availability of in person appointments. The patient expressed understanding and agreed to proceed.    History of Present Illness: Alejandra Mcmahon is a 47 y.o. who identifies as a female who was assigned female at birth, and is being seen today for panic attacks that started Sunday. Denies any chest pain, SOB.   HPI: Anxiety Presents for follow-up visit. Symptoms include depressed mood, excessive worry, insomnia, irritability, nervous/anxious behavior, palpitations, panic and restlessness. Symptoms occur most days. The severity of symptoms is moderate.    Depression        This is a chronic problem.  The current episode started more than 1 year ago.   Associated symptoms include fatigue, insomnia, irritable, restlessness and sad.  Associated symptoms include no helplessness and no hopelessness.  Past treatments include SNRIs - Serotonin and norepinephrine reuptake inhibitors.  Past medical history includes anxiety.     Problems:  Patient Active Problem List   Diagnosis Date Noted   Chronic bilateral low back pain without sciatica 10/15/2022   Hyperlipidemia 09/25/2021   Obesity (BMI 30-39.9) 05/27/2018   Encounter for surveillance of contraceptive pills 02/18/2017   GAD (generalized anxiety disorder) 05/09/2015   GERD (gastroesophageal reflux disease) 03/16/2013   Depression 03/16/2013   Allergic rhinitis 03/16/2013    Allergies:  Allergies  Allergen Reactions   Amoxicillin    Celexa [Citalopram Hydrobromide] Nausea Only  Wellbutrin [Bupropion]    Medications:  Current Outpatient Medications:    hydrOXYzine (VISTARIL) 25 MG capsule, Take 1 capsule (25 mg total) by mouth every 8 (eight) hours as needed., Disp: 90 capsule, Rfl: 1   atorvastatin (LIPITOR) 20 MG tablet, TAKE 1 TABLET( 20 MG TOTAL) BY MOUTH DAILY.( PATIENT NEEDS TO BE  SEEN BEFORE NEXT REFILL), Disp: 90 tablet, Rfl: 3   busPIRone (BUSPAR) 30 MG tablet, TAKE 1 TABLET BY MOUTH TWICE DAILY, Disp: 60 tablet, Rfl: 2   cyclobenzaprine (FLEXERIL) 10 MG tablet, Take 1 tablet (10 mg total) by mouth 3 (three) times daily., Disp: 90 tablet, Rfl: 2   DULoxetine (CYMBALTA) 30 MG capsule, Take 3 capsules (90 mg total) by mouth daily., Disp: 270 capsule, Rfl: 1   norgestimate-ethinyl estradiol (MILI) 0.25-35 MG-MCG tablet, ( ALTERNATE FOR ESTARYLLA) TAKE 1 TABLET BY MOUTH DAILY, Disp: 84 tablet, Rfl: 4   triamcinolone (KENALOG) 0.1 %, Apply 1 application topically 2 (two) times daily., Disp: 30 g, Rfl: 0  Observations/Objective: Patient is well-developed, well-nourished in no acute distress.  Resting comfortably at home.  Head is normocephalic, atraumatic.  No labored breathing. Speech is clear and coherent with logical content.  Patient is alert and oriented at baseline.    Assessment and Plan: 1. GAD (generalized anxiety disorder) - DULoxetine (CYMBALTA) 30 MG capsule; Take 3 capsules (90 mg total) by mouth daily.  Dispense: 270 capsule; Refill: 1 - hydrOXYzine (VISTARIL) 25 MG capsule; Take 1 capsule (25 mg total) by mouth every 8 (eight) hours as needed.  Dispense: 90 capsule; Refill: 1  2. Current moderate episode of major depressive disorder, unspecified whether recurrent (HCC) - DULoxetine (CYMBALTA) 30 MG capsule; Take 3 capsules (90 mg total) by mouth daily.  Dispense: 270 capsule; Refill: 1  3. Panic attack - DULoxetine (CYMBALTA) 30 MG capsule; Take 3 capsules (90 mg total) by mouth daily.  Dispense: 270 capsule; Refill: 1 - hydrOXYzine (VISTARIL) 25 MG capsule; Take 1 capsule (25 mg total) by mouth every 8 (eight) hours as needed.  Dispense: 90 capsule; Refill: 1  Increase Cymbalta to 90 mg from 60 mg  Vistaril 25 mg TID prn  Continue Buspar  Stress management  If symptoms continues will need to be seen in person for EKG and lab work  Follow up if  symptoms worsen or do not improve   Follow Up Instructions: I discussed the assessment and treatment plan with the patient. The patient was provided an opportunity to ask questions and all were answered. The patient agreed with the plan and demonstrated an understanding of the instructions.  A copy of instructions were sent to the patient via MyChart unless otherwise noted below.    The patient was advised to call back or seek an in-person evaluation if the symptoms worsen or if the condition fails to improve as anticipated.  Time:  I spent 12 minutes with the patient via telehealth technology discussing the above problems/concerns.    Jannifer Rodney, FNP

## 2022-12-26 ENCOUNTER — Other Ambulatory Visit: Payer: Self-pay | Admitting: Family

## 2022-12-26 DIAGNOSIS — F411 Generalized anxiety disorder: Secondary | ICD-10-CM

## 2022-12-26 DIAGNOSIS — F321 Major depressive disorder, single episode, moderate: Secondary | ICD-10-CM

## 2022-12-28 NOTE — Therapy (Signed)
OUTPATIENT PHYSICAL THERAPY CERVICAL EVALUATION   Patient Name: Alejandra Mcmahon MRN: [id number] DOB:1975-08-13, 47 y.o., female Today's Date: 12/28/2022  END OF SESSION:   Past Medical History:  Diagnosis Date   Allergy    Depression    GERD (gastroesophageal reflux disease)    Past Surgical History:  Procedure Laterality Date   CESAREAN SECTION     Patient Active Problem List   Diagnosis Date Noted   Chronic bilateral low back pain without sciatica 10/15/2022   Hyperlipidemia 09/25/2021   Obesity (BMI 30-39.9) 05/27/2018   Encounter for surveillance of contraceptive pills 02/18/2017   GAD (generalized anxiety disorder) 05/09/2015   GERD (gastroesophageal reflux disease) 03/16/2013   Depression 03/16/2013   Allergic rhinitis 03/16/2013    PCP: Junie Spencer, FNP  REFERRING PROVIDER: Mechele Claude, MD   REFERRING DIAG: M54.12 (ICD-10-CM) - Cervical radiculopathy at C6   THERAPY DIAG:  No diagnosis found.  Rationale for Evaluation and Treatment: Rehabilitation  ONSET DATE: 12/05/22  SUBJECTIVE:                                                                                                                                                                                                         SUBJECTIVE STATEMENT: *** Hand dominance: {MISC; OT HAND DOMINANCE:[phone]}  PERTINENT HISTORY:  Per referring physician note: General anxiety D/O, mod depression, panic attack 12/25/22  HPI COZY VEALE presents for neck pain starting on 11/02/22. It got better on prednisone, but came back once off of the prednisone. Worst first thing in the morning. Stiff all day. Goes to shoulders and to nuchal ridge. Pain ranges 6-8/10 Cervical radiculopathy at C6 -     MR CERVICAL SPINE WO CONTRAST; Future -     Ambulatory referral to Physical Therapy  PAIN:  Are you having pain? {OPRCPAIN:27236}  PRECAUTIONS: None  WEIGHT BEARING RESTRICTIONS: No  FALLS:  Has patient  fallen in last 6 months? {fallsyesno:27318}  LIVING ENVIRONMENT: Lives with: {OPRC lives with:25569::"lives with their family"} Lives in: {Lives in:25570} Stairs: {opstairs:27293} Has following equipment at home: {Assistive devices:23999}  OCCUPATION: ***  PLOF: {PLOF:24004}  PATIENT GOALS: ***  NEXT MD VISIT: ***  OBJECTIVE:   DIAGNOSTIC FINDINGS:  Cerv X ray 11/07/22 IMPRESSION: Degenerative changes. No acute osseous abnormalities.  PATIENT SURVEYS:  {rehab surveys:24030}  COGNITION: Overall cognitive status: {cognition:24006}  SENSATION: {sensation:27233}  POSTURE: {posture:25561}  PALPATION: ***   CERVICAL ROM:   {AROM/PROM:27142} ROM A/PROM (deg) eval  Flexion   Extension   Right lateral flexion   Left lateral flexion  Right rotation   Left rotation    (Blank rows = not tested)  UPPER EXTREMITY ROM:  {AROM/PROM:27142} ROM Right eval Left eval  Shoulder flexion    Shoulder extension    Shoulder abduction    Shoulder adduction    Shoulder extension    Shoulder internal rotation    Shoulder external rotation    Elbow flexion    Elbow extension    Wrist flexion    Wrist extension    Wrist ulnar deviation    Wrist radial deviation    Wrist pronation    Wrist supination     (Blank rows = not tested)  UPPER EXTREMITY MMT:  MMT Right eval Left eval  Shoulder flexion    Shoulder extension    Shoulder abduction    Shoulder adduction    Shoulder extension    Shoulder internal rotation    Shoulder external rotation    Middle trapezius    Lower trapezius    Elbow flexion    Elbow extension    Wrist flexion    Wrist extension    Wrist ulnar deviation    Wrist radial deviation    Wrist pronation    Wrist supination    Grip strength     (Blank rows = not tested)  CERVICAL SPECIAL TESTS:  {Cervical special tests:25246}  FUNCTIONAL TESTS:  {Functional tests:24029}  TODAY'S TREATMENT:                                                                                                                               DATE:  12/31/22 Education   PATIENT EDUCATION:  Education details: POC Person educated: Patient Education method: Explanation Education comprehension: verbalized understanding  HOME EXERCISE PROGRAM: ***  ASSESSMENT:  CLINICAL IMPRESSION: Patient is a 47 y.o. who was seen today for physical therapy evaluation and treatment for ***.   OBJECTIVE IMPAIRMENTS: {opptimpairments:25111}.   ACTIVITY LIMITATIONS: {activitylimitations:27494}  PARTICIPATION LIMITATIONS: {participationrestrictions:25113}  PERSONAL FACTORS: {Personal factors:25162} are also affecting patient's functional outcome.   REHAB POTENTIAL: {rehabpotential:25112}  CLINICAL DECISION MAKING: {clinical decision making:25114}  EVALUATION COMPLEXITY: {Evaluation complexity:25115}   GOALS: Goals reviewed with patient? {yes/no:20286}  SHORT TERM GOALS: Target date: ***  *** Baseline:  Goal status: {GOALSTATUS:25110}  2.  *** Baseline:  Goal status: {GOALSTATUS:25110}  3.  *** Baseline:  Goal status: {GOALSTATUS:25110}  4.  *** Baseline:  Goal status: {GOALSTATUS:25110}  5.  *** Baseline:  Goal status: {GOALSTATUS:25110}  6.  *** Baseline:  Goal status: {GOALSTATUS:25110}  LONG TERM GOALS: Target date: ***  *** Baseline:  Goal status: {GOALSTATUS:25110}  2.  *** Baseline:  Goal status: {GOALSTATUS:25110}  3.  *** Baseline:  Goal status: {GOALSTATUS:25110}  4.  *** Baseline:  Goal status: {GOALSTATUS:25110}  5.  *** Baseline:  Goal status: {GOALSTATUS:25110}  6.  *** Baseline:  Goal status: {GOALSTATUS:25110}   PLAN:  PT FREQUENCY: {rehab frequency:25116}  PT DURATION: {rehab duration:25117}  PLANNED INTERVENTIONS: {rehab planned  interventions:25118::"Therapeutic exercises","Therapeutic activity","Neuromuscular re-education","Balance training","Gait training","Patient/Family education","Self  Care","Joint mobilization"}  PLAN FOR NEXT SESSION: ***   Iona Beard, DPT 12/28/2022, 10:11 AM

## 2022-12-31 ENCOUNTER — Encounter: Payer: Self-pay | Admitting: Physical Therapy

## 2022-12-31 ENCOUNTER — Ambulatory Visit: Payer: Commercial Managed Care - PPO | Attending: Family Medicine | Admitting: Physical Therapy

## 2022-12-31 DIAGNOSIS — M5412 Radiculopathy, cervical region: Secondary | ICD-10-CM | POA: Insufficient documentation

## 2022-12-31 DIAGNOSIS — R278 Other lack of coordination: Secondary | ICD-10-CM | POA: Insufficient documentation

## 2022-12-31 DIAGNOSIS — R293 Abnormal posture: Secondary | ICD-10-CM | POA: Diagnosis present

## 2022-12-31 DIAGNOSIS — M6281 Muscle weakness (generalized): Secondary | ICD-10-CM | POA: Diagnosis present

## 2022-12-31 DIAGNOSIS — M436 Torticollis: Secondary | ICD-10-CM | POA: Diagnosis present

## 2023-01-03 DIAGNOSIS — R682 Dry mouth, unspecified: Secondary | ICD-10-CM

## 2023-01-10 ENCOUNTER — Ambulatory Visit (HOSPITAL_COMMUNITY)
Admission: RE | Admit: 2023-01-10 | Discharge: 2023-01-10 | Disposition: A | Discharge status: Home / Self Care | Payer: Commercial Managed Care - PPO | Source: Ambulatory Visit | Attending: Family Medicine | Admitting: Family Medicine

## 2023-01-10 DIAGNOSIS — M5412 Radiculopathy, cervical region: Secondary | ICD-10-CM | POA: Diagnosis not present

## 2023-01-14 ENCOUNTER — Ambulatory Visit: Payer: Commercial Managed Care - PPO | Admitting: Physical Therapy

## 2023-01-14 ENCOUNTER — Encounter: Payer: Self-pay | Admitting: Physical Therapy

## 2023-01-14 DIAGNOSIS — R293 Abnormal posture: Secondary | ICD-10-CM | POA: Diagnosis not present

## 2023-01-14 DIAGNOSIS — R278 Other lack of coordination: Secondary | ICD-10-CM

## 2023-01-14 DIAGNOSIS — M6281 Muscle weakness (generalized): Secondary | ICD-10-CM

## 2023-01-14 DIAGNOSIS — M5412 Radiculopathy, cervical region: Secondary | ICD-10-CM

## 2023-01-14 DIAGNOSIS — M436 Torticollis: Secondary | ICD-10-CM

## 2023-01-14 NOTE — Therapy (Signed)
OUTPATIENT PHYSICAL THERAPY CERVICAL EVALUATION   Patient Name: Alejandra Mcmahon MRN: [id number] DOB:07-29-1975, 47 y.o., female Today's Date: 01/14/2023  END OF SESSION:  PT End of Session - 01/14/23 1626     Visit Number 2    Date for PT Re-Evaluation 03/11/23    PT Start Time 1626    PT Stop Time 1710    PT Time Calculation (min) 44 min    Activity Tolerance Patient limited by pain    Behavior During Therapy [facility] for tasks assessed/performed             Past Medical History:  Diagnosis Date   Allergy    Depression    GERD (gastroesophageal reflux disease)    Past Surgical History:  Procedure Laterality Date   CESAREAN SECTION     Patient Active Problem List   Diagnosis Date Noted   Chronic bilateral low back pain without sciatica 10/15/2022   Hyperlipidemia 09/25/2021   Obesity (BMI 30-39.9) 05/27/2018   Encounter for surveillance of contraceptive pills 02/18/2017   GAD (generalized anxiety disorder) 05/09/2015   GERD (gastroesophageal reflux disease) 03/16/2013   Depression 03/16/2013   Allergic rhinitis 03/16/2013    PCP: Junie Spencer, FNP  REFERRING PROVIDER: Mechele Claude, MD   REFERRING DIAG: M54.12 (ICD-10-CM) - Cervical radiculopathy at C6   THERAPY DIAG:  Abnormal posture  Muscle weakness (generalized)  Other lack of coordination  Radiculopathy, cervical region  Neck stiffness  Rationale for Evaluation and Treatment: Rehabilitation  ONSET DATE: 12/05/22  SUBJECTIVE:                                                                                                                                                                                                         SUBJECTIVE STATEMENT: Patient reports that she fell out of a chair over the weekend and has had increased pain since. She also reports soreness after performing her Hep, but it improves after mobility.  Hand dominance: Right  PERTINENT HISTORY:  Per referring physician  note: General anxiety D/O, mod depression, panic attack 12/25/22  HPI Alejandra Mcmahon presents for neck pain starting on 11/02/22. It got better on prednisone, but came back once off of the prednisone. Worst first thing in the morning. Stiff all day. Goes to shoulders and to nuchal ridge. Pain ranges 6-8/10 Cervical radiculopathy at C6 -     MR CERVICAL SPINE WO CONTRAST; Future -     Ambulatory referral to Physical Therapy  PAIN:  Are you having pain? Yes: NPRS scale: 7/10 Pain location: R side  of neck, moves into R upper quadrant Pain description: burning Aggravating factors: Looking down, gets stiff, neck rotation Relieving factors: Muscle relaxer  PRECAUTIONS: None  WEIGHT BEARING RESTRICTIONS: No  FALLS:  Has patient fallen in last 6 months? Yes. 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  PLOF: Independent  PATIENT GOALS: Learn how to control the pain.  NEXT MD VISIT: Some time after MRI, which is scheduled for June 20th  OBJECTIVE:   DIAGNOSTIC FINDINGS:  Cerv X ray 11/07/22 IMPRESSION: Degenerative changes. No acute osseous abnormalities.  PATIENT SURVEYS:  FOTO 44.6  COGNITION: Overall cognitive status: Within functional limits for tasks assessed  SENSATION: Reports some intermittent tingling and numbness in B hands when she stays in one position for too long.  POSTURE: rounded shoulders, forward head, and increased lumbar lordosis  PALPATION: Mild tightness and TTP on R cerv paraspinals, UT, B pects   CERVICAL ROM:   Active ROM A/PROM (deg) eval  Flexion 80% P  Extension 30% P  Right lateral flexion 40% P  Left lateral flexion 60% P on R  Right rotation 40% P  Left rotation 80%    (Blank rows = not tested)  UPPER EXTREMITY ROM: B shoulder flex and abd WFL, but causes pain in neck.  UPPER EXTREMITY MMT: B shoulder flex and abd strength limited by pain in neck, otherwise WNL  CERVICAL SPECIAL TESTS:  Upper limb tension test (ULTT): Positive, Spurling's test: Negative, and Distraction test: Positive  TODAY'S TREATMENT:                                                                                                                              DATE:  01/14/23 NuStep L5 x 6 minutes. STM to cervical paraspinals, BUT and rhomboids Cervical Snags Head pull x 60 sec Cervical flexion-passive stretch Supine chin tucks 10 sec hold, x 10 reps Chair dips with hands on armrests. 10 reps Standing shoulder ext, rows, ER with B tband 2 x 10 reps each 3 Way shoulder elevation with 3# weights in each hand, 10 reps,  Doorway stretch, 4 x 15 sec. Median nerve glide x 4, R. Ball on wall, diamond pattern, 5 x each direction.  12/31/22 Education   PATIENT EDUCATION:  Education details: POC Person educated: Patient Education method: Explanation Education comprehension: verbalized understanding  HOME EXERCISE PROGRAM: [id number], Plus UE neural tension exercises.  ASSESSMENT:  CLINICAL IMPRESSION: Patient reports increased soreness after falling out of a chair over the weekend. Progressed her HEP with strengthening and postural stability exercises. She tolerated all with no C/O.  OBJECTIVE IMPAIRMENTS: decreased activity tolerance, decreased coordination, decreased endurance, decreased mobility, decreased ROM, decreased strength, increased muscle spasms,  impaired flexibility, improper body mechanics, postural dysfunction, and pain.   ACTIVITY LIMITATIONS: carrying, lifting, bending, sleeping, and reach over head  PARTICIPATION LIMITATIONS: meal prep, cleaning, laundry, driving, shopping, and occupation  PERSONAL FACTORS: Past/current experiences are also affecting patient's  functional outcome.   REHAB POTENTIAL: Good  CLINICAL DECISION MAKING: Stable/uncomplicated  EVALUATION COMPLEXITY: Low   GOALS: Goals reviewed with patient? Yes  SHORT TERM GOALS: Target date: 01/14/23  I with initial HEP Baseline:  Goal status: 01/14/23-met  LONG TERM GOALS: Target date: 03/11/23  I with final HEP Baseline:  Goal status: INITIAL  2.  Patient will recover full ROM in neck with pain < 3/10 Baseline: Limited in all planes, pain up to 7/10 Goal status: INITIAL  3.  Patient will increase B shoulder strength to at least 4+/5 with neck pain < 3/10 Baseline: B shoulder strength 3/5, limited by neck pain with any resistance Goal status: INITIAL  4.  Patient will report the ability to perform a full day of work with neck and shoulder pain < 3/10 Baseline: Lifting restricitons Goal status: INITIAL  5.  Increase FOTO score to at least 60 Baseline: 44.6 Goal status: INITIAL  PLAN:  PT FREQUENCY: 1-2x/week  PT DURATION: 10 weeks  PLANNED INTERVENTIONS: Therapeutic exercises, Therapeutic activity, Neuromuscular re-education, Balance training, Gait training, Patient/Family education, Self Care, Joint mobilization, Dry Needling, Electrical stimulation, Spinal mobilization, Cryotherapy, Moist heat, Ionotophoresis 4mg /ml Dexamethasone, and Manual therapy  PLAN FOR NEXT SESSION: Progress HEP with strength, postural control, and stretching exercises.  Iona Beard, DPT 01/14/2023, 5:09 PM

## 2023-01-21 ENCOUNTER — Encounter: Payer: Self-pay | Admitting: Physical Therapy

## 2023-01-21 ENCOUNTER — Ambulatory Visit: Payer: Commercial Managed Care - PPO | Attending: Family Medicine | Admitting: Physical Therapy

## 2023-01-21 DIAGNOSIS — M6281 Muscle weakness (generalized): Secondary | ICD-10-CM | POA: Diagnosis present

## 2023-01-21 DIAGNOSIS — M436 Torticollis: Secondary | ICD-10-CM | POA: Insufficient documentation

## 2023-01-21 DIAGNOSIS — R278 Other lack of coordination: Secondary | ICD-10-CM | POA: Insufficient documentation

## 2023-01-21 DIAGNOSIS — M5412 Radiculopathy, cervical region: Secondary | ICD-10-CM | POA: Insufficient documentation

## 2023-01-21 DIAGNOSIS — R293 Abnormal posture: Secondary | ICD-10-CM | POA: Insufficient documentation

## 2023-01-21 NOTE — Therapy (Signed)
OUTPATIENT PHYSICAL THERAPY CERVICAL EVALUATION   Patient Name: Alejandra Mcmahon MRN: [id number] DOB:1976/05/24, 47 y.o., female Today's Date: 01/21/2023  END OF SESSION:  PT End of Session - 01/21/23 1636     Visit Number 3    Date for PT Re-Evaluation 03/11/23    PT Start Time 1632    PT Stop Time 1710    PT Time Calculation (min) 38 min    Activity Tolerance Patient limited by pain    Behavior During Therapy [facility] for tasks assessed/performed             Past Medical History:  Diagnosis Date   Allergy    Depression    GERD (gastroesophageal reflux disease)    Past Surgical History:  Procedure Laterality Date   CESAREAN SECTION     Patient Active Problem List   Diagnosis Date Noted   Chronic bilateral low back pain without sciatica 10/15/2022   Hyperlipidemia 09/25/2021   Obesity (BMI 30-39.9) 05/27/2018   Encounter for surveillance of contraceptive pills 02/18/2017   GAD (generalized anxiety disorder) 05/09/2015   GERD (gastroesophageal reflux disease) 03/16/2013   Depression 03/16/2013   Allergic rhinitis 03/16/2013    PCP: Junie Spencer, FNP  REFERRING PROVIDER: Mechele Claude, MD   REFERRING DIAG: M54.12 (ICD-10-CM) - Cervical radiculopathy at C6   THERAPY DIAG:  Abnormal posture  Muscle weakness (generalized)  Other lack of coordination  Neck stiffness  Radiculopathy, cervical region  Rationale for Evaluation and Treatment: Rehabilitation  ONSET DATE: 12/05/22  SUBJECTIVE:                                                                                                                                                                                                         SUBJECTIVE STATEMENT: Patient reports that she fell out of a chair over the weekend and has had increased pain since. She also reports soreness after performing her Hep, but it improves after mobility.  Hand dominance: Right  PERTINENT HISTORY:  Per referring physician  note: General anxiety D/O, mod depression, panic attack 12/25/22  HPI Alejandra Mcmahon presents for neck pain starting on 11/02/22. It got better on prednisone, but came back once off of the prednisone. Worst first thing in the morning. Stiff all day. Goes to shoulders and to nuchal ridge. Pain ranges 6-8/10 Cervical radiculopathy at C6 -     MR CERVICAL SPINE WO CONTRAST; Future -     Ambulatory referral to Physical Therapy  PAIN:  Are you having pain? Yes: NPRS scale: 7/10 Pain location: R side  of neck, moves into R upper quadrant Pain description: burning Aggravating factors: Looking down, gets stiff, neck rotation Relieving factors: Muscle relaxer  PRECAUTIONS: None  WEIGHT BEARING RESTRICTIONS: No  FALLS:  Has patient fallen in last 6 months? Yes. 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  PLOF: Independent  PATIENT GOALS: Learn how to control the pain.  NEXT MD VISIT: Some time after MRI, which is scheduled for June 20th  OBJECTIVE:   DIAGNOSTIC FINDINGS:  Cerv X ray 11/07/22 IMPRESSION: Degenerative changes. No acute osseous abnormalities.  Cervical MRI 01/10/23: IMPRESSION: 1. Multilevel cervical spondylosis as described above. Moderate right neuroforaminal stenosis at C3-C4 secondary to severe right facet arthropathy with prominent degenerative perifacet marrow edema that could be a source of acute pain. 2. Moderate left neuroforaminal stenosis at C4-C5. 3. Small focus of increased T2 signal in the central cord at C3-C4, likely myelomalacia.  PATIENT SURVEYS:  FOTO 44.6  COGNITION: Overall cognitive status: Within functional limits for tasks assessed  SENSATION: Reports some  intermittent tingling and numbness in B hands when she stays in one position for too long.  POSTURE: rounded shoulders, forward head, and increased lumbar lordosis  PALPATION: Mild tightness and TTP on R cerv paraspinals, UT, B pects   CERVICAL ROM:   Active ROM A/PROM (deg) eval  Flexion 80% P  Extension 30% P  Right lateral flexion 40% P  Left lateral flexion 60% P on R  Right rotation 40% P  Left rotation 80%   (Blank rows = not tested)  UPPER EXTREMITY ROM: B shoulder flex and abd WFL, but causes pain in neck.  UPPER EXTREMITY MMT: B shoulder flex and abd strength limited by pain in neck, otherwise WNL  CERVICAL SPECIAL TESTS:  Upper limb tension test (ULTT): Positive, Spurling's test: Negative, and Distraction test: Positive  TODAY'S TREATMENT:                                                                                                                              DATE:  01/21/23 NuStep L5 x 6 min Seated lower cerv/UT stretch, 3 x 15 seconds Seated lat press with hands behind hips, 10 x 3-5 sec holds Standing with back to wall, chin tuck to hold ball on wall, performing shoulder ext and rows, 2 x 10 reps with G tband. Squats with 7# dumb bell, mod VC for correct technique. Paloff press, 10#, 2 x 10 reps each side Doorway stretch for pects Bent over row with 3# in each hand, 2 x 10 reps Dead Bug- single arm, single leg x 5 each. Opposite arm and leg, 2 x 5, challenging. Ball on wall single arm circles x 5  in each direction Forward flex over ball stretch x 5 forward and then to each side.  01/14/23 NuStep L5 x 6 minutes. STM to cervical paraspinals, BUT and rhomboids Cervical Snags Head pull x 60 sec Cervical flexion-passive stretch Supine chin tucks 10 sec hold, x 10 reps Chair dips with hands on armrests. 10 reps Standing shoulder ext, rows, ER with B tband 2 x 10 reps each 3 Way shoulder elevation with 3# weights in each hand, 10 reps,  Doorway stretch, 4 x 15  sec. Median nerve glide x 4, R. Ball on wall, diamond pattern, 5 x each direction.  12/31/22 Education   PATIENT EDUCATION:  Education details: POC Person educated: Patient Education method: Explanation Education comprehension: verbalized understanding  HOME EXERCISE PROGRAM: [id number], Plus UE neural tension exercises.  ASSESSMENT:  CLINICAL IMPRESSION: Patient reports fatigue today. Her AC was out this weekend, so she did not get much rest. It is back on now. She has a Dr appointment tomorrow to review the results of her MRI. Emphasized both strength/postural control, and stretching. She hung in for all exercises, challenged by trunk stability.  OBJECTIVE IMPAIRMENTS: decreased activity tolerance, decreased coordination, decreased endurance, decreased mobility, decreased ROM, decreased strength, increased muscle spasms, impaired flexibility, improper body mechanics, postural dysfunction, and pain.   ACTIVITY LIMITATIONS: carrying, lifting, bending, sleeping, and reach over head  PARTICIPATION LIMITATIONS: meal prep, cleaning, laundry, driving, shopping, and occupation  PERSONAL FACTORS: Past/current experiences are also affecting patient's functional outcome.   REHAB POTENTIAL: Good  CLINICAL DECISION MAKING: Stable/uncomplicated  EVALUATION COMPLEXITY: Low   GOALS: Goals reviewed with patient? Yes  SHORT TERM GOALS: Target date: 01/14/23  I with initial HEP Baseline:  Goal status: 01/14/23-met  LONG TERM GOALS: Target date: 03/11/23  I with final HEP Baseline:  Goal status: INITIAL  2.  Patient will recover full ROM in neck with pain < 3/10 Baseline: Limited in all planes, pain up to 7/10 Goal status: INITIAL  3.  Patient will increase B shoulder strength to at least 4+/5 with neck pain < 3/10 Baseline: B shoulder strength 3/5, limited by neck pain with any resistance Goal status: 01/21/23- Strength 4-/5 with mild pain-ongoing  4.  Patient will report the  ability to perform a full day of work with neck and shoulder pain < 3/10 Baseline: Lifting restricitons Goal status: INITIAL  5.  Increase FOTO score to at least 60 Baseline: 44.6 Goal status: INITIAL  PLAN:  PT FREQUENCY: 1-2x/week  PT DURATION: 10 weeks  PLANNED INTERVENTIONS: Therapeutic exercises, Therapeutic activity, Neuromuscular re-education, Balance training, Gait training, Patient/Family education, Self Care, Joint mobilization, Dry Needling, Electrical stimulation, Spinal mobilization, Cryotherapy, Moist heat, Ionotophoresis 4mg /ml Dexamethasone, and Manual therapy  PLAN FOR NEXT SESSION: Progress HEP with strength, postural control, and stretching exercises.  Iona Beard, DPT 01/21/2023, 5:12 PM

## 2023-01-22 ENCOUNTER — Ambulatory Visit: Payer: Commercial Managed Care - PPO | Admitting: Family

## 2023-01-22 ENCOUNTER — Encounter: Payer: Self-pay | Admitting: Family

## 2023-01-22 VITALS — BP 122/77 | HR 98 | Temp 98.1°F | Ht 64.0 in | Wt 190.0 lb

## 2023-01-22 DIAGNOSIS — M47812 Spondylosis without myelopathy or radiculopathy, cervical region: Secondary | ICD-10-CM | POA: Diagnosis not present

## 2023-01-22 DIAGNOSIS — M4802 Spinal stenosis, cervical region: Secondary | ICD-10-CM | POA: Diagnosis not present

## 2023-01-22 DIAGNOSIS — M542 Cervicalgia: Secondary | ICD-10-CM

## 2023-01-22 DIAGNOSIS — G9589 Other specified diseases of spinal cord: Secondary | ICD-10-CM | POA: Diagnosis not present

## 2023-01-22 MED ORDER — DICLOFENAC SODIUM 75 MG PO TBEC
75.0000 mg | DELAYED_RELEASE_TABLET | Freq: Two times a day (BID) | ORAL | 2 refills | Status: DC
Start: 2023-01-22 — End: 2023-03-15
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
## 2023-01-22 NOTE — Patient Instructions (Signed)
Spinal Stenosis  Spinal stenosis is a condition that happens when the spinal canal narrows. The spinal canal is the space between the bones of your spine (vertebrae). This narrowing puts pressure on the spinal cord and nerves that exit the spine and run down the arms or legs. When nerves exiting the spine are pinched, it can cause pain, numbness, or weakness in the arms or legs. Spinal stenosis can affect the vertebrae in the neck, upper back, and lower back. Spinal stenosis can range from mild to severe. What are the causes? This condition is caused by areas of bone pushing into the spinal canal. This condition may be present at birth (congenital), or it may be caused by: Slow breakdown of your vertebrae (spinal degeneration). This usually starts between 22 and 9 years of age. Injury (trauma) to your spine. Previous spinal surgery. Tumors in your spine. Calcium deposits in your spine. What increases the risk? The following factors may make you more likely to develop this condition: Being older than age 22. Being born with an abnormally shaped spine (congenitalspinal deformity), such as scoliosis. Having arthritis. What are the signs or symptoms? Symptoms of this condition include: Pain in the neck or back that is generally worse with activities, particularly when standing or walking. Numbness, tingling, hot or cold sensations, weakness, or tiredness (fatigue) in your arms or legs. This can happen in one arm or leg, or both. Pain going from the buttock, down the thigh, and to the calf (sciatica). This can happen in one or both legs. Falling frequently. Foot drop. This is when you have trouble lifting the front part of your foot and it drags on the ground when you walk. This can lead to muscle weakness. In more severe cases, you may develop: Problems having a bowel movement or urinating. Difficulty having sex. Loss of feeling in your legs and inability to walk. Symptoms may come on slowly  and get worse over time. In some cases, there are no symptoms. How is this diagnosed? This condition is diagnosed based on your medical history and a physical exam. You may also have tests, such as an X-ray, CT scan, or MRI. How is this treated? Treatment for this condition often focuses on managing your pain and any other symptoms. Treatment may include: Practicing good posture to lessen pressure on your nerves. Exercises to strengthen muscles, build endurance, improve balance, and maintain range of motion. This may include physical therapy to restore movement and strength to your back. Losing weight, if needed. Medicines to reduce inflammation or pain. This may include a medicine that is injected into your spine (steroidinjection). Assistive devices, such as a corset or brace. In some cases, surgery may be needed. The most common procedure is decompression laminectomy. This removes excess bone that puts pressure on your nerve roots. Follow these instructions at home: Managing pain, stiffness, and swelling  Practice good posture. If you were given a brace or a corset, wear it as told by your health care provider. Maintain a healthy weight. Talk with your health care provider if you need help losing weight. If directed, apply heat to the affected area as often as told by your health care provider. Use the heat source that your health care provider recommends, such as a moist heat pack or a heating pad. Place a towel between your skin and the heat source. Leave the heat on for 20-30 minutes. If your skin turns bright red, remove the heat right away to prevent burns. The risk  of burns is higher if you cannot feel pain, heat, or cold. Activity Do all exercises and stretches as told by your health care provider. Do not do any activities that cause pain. You may have to avoid lifting. Ask your health care provider how much you can safely lift. Return to your normal activities as told by your  health care provider. Ask your health care provider what activities are safe for you. General instructions Take over-the-counter and prescription medicines only as told by your health care provider. Do not use any products that contain nicotine or tobacco. These products include cigarettes, chewing tobacco, and vaping devices, such as e-cigarettes. If you need help quitting, ask your health care provider. Eat a healthy diet. This includes plenty of fruits and vegetables, whole grains, and low-fat (lean) protein. Where to find more information [employer] of Arthritis and Musculoskeletal and Skin Diseases: www.niams.http://www.myers.net/ Contact a health care provider if: Your symptoms do not get better or they get worse. You have a fever. Get help right away if: You have new pain or symptoms of severe pain, such as: New or worsening pain in your neck or upper back. Severe pain that cannot be controlled with medicines. A severe headache that gets worse when you stand. You are dizzy. You have vision problems, such as blurred vision or double vision. You have nausea or vomiting. You develop new or worsening numbness or tingling in your back or legs. You lose control of your bowels or bladder. You have pain, redness, swelling, or warmth in your arm or leg. These symptoms may be an emergency. Get help right away. Call 911. Do not wait to see if the symptoms will go away. Do not drive yourself to the hospital. Summary Spinal stenosis is a condition that happens when the spinal canal narrows, putting pressure on the spinal cord or nerves that exit the vertebrae. This condition is caused by areas of bone pushing into the spinal canal. Spinal stenosis can cause numbness, weakness, or pain in the buttocks, neck, back, arms, and legs. This condition is usually diagnosed with your medical history, a physical exam, and tests, such as an X-ray, CT scan, or MRI. This information is not intended to replace  advice given to you by your health care provider. Make sure you discuss any questions you have with your health care provider. Document Revised: 10/03/2021 Document Reviewed: 10/03/2021 Elsevier Patient Education  2024 [employer].

## 2023-01-28 ENCOUNTER — Ambulatory Visit: Payer: Commercial Managed Care - PPO | Admitting: Physical Therapy

## 2023-01-28 ENCOUNTER — Encounter: Payer: Self-pay | Admitting: Physical Therapy

## 2023-01-28 DIAGNOSIS — R293 Abnormal posture: Secondary | ICD-10-CM

## 2023-01-28 DIAGNOSIS — R278 Other lack of coordination: Secondary | ICD-10-CM

## 2023-01-28 DIAGNOSIS — M6281 Muscle weakness (generalized): Secondary | ICD-10-CM

## 2023-01-28 DIAGNOSIS — M436 Torticollis: Secondary | ICD-10-CM

## 2023-01-28 DIAGNOSIS — M5412 Radiculopathy, cervical region: Secondary | ICD-10-CM

## 2023-01-28 NOTE — Therapy (Signed)
OUTPATIENT PHYSICAL THERAPY CERVICAL EVALUATION   Patient Name: Alejandra Mcmahon MRN: [id number] DOB:07-10-1976, 47 y.o., female Today's Date: 01/28/2023  END OF SESSION:  PT End of Session - 01/28/23 1628     Visit Number 4    Date for PT Re-Evaluation 03/11/23    PT Start Time 1628    PT Stop Time 1710    PT Time Calculation (min) 42 min    Activity Tolerance Patient tolerated treatment well    Behavior During Therapy [facility] for tasks assessed/performed             Past Medical History:  Diagnosis Date   Allergy    Depression    GERD (gastroesophageal reflux disease)    Past Surgical History:  Procedure Laterality Date   CESAREAN SECTION     Patient Active Problem List   Diagnosis Date Noted   Neuroforaminal stenosis of cervical spine 01/22/2023   Multilevel cervical spondylosis without myelopathy 01/22/2023   Myelomalacia of cervical cord (HCC) 01/22/2023   Chronic bilateral low back pain without sciatica 10/15/2022   Hyperlipidemia 09/25/2021   Obesity (BMI 30-39.9) 05/27/2018   Encounter for surveillance of contraceptive pills 02/18/2017   GAD (generalized anxiety disorder) 05/09/2015   GERD (gastroesophageal reflux disease) 03/16/2013   Depression 03/16/2013   Allergic rhinitis 03/16/2013    PCP: Junie Spencer, FNP  REFERRING PROVIDER: Mechele Claude, MD   REFERRING DIAG: M54.12 (ICD-10-CM) - Cervical radiculopathy at C6   THERAPY DIAG:  Abnormal posture  Muscle weakness (generalized)  Neck stiffness  Other lack of coordination  Radiculopathy, cervical region  Rationale for Evaluation and Treatment: Rehabilitation  ONSET DATE: 12/05/22  SUBJECTIVE:                                                                                                                                                                                                         SUBJECTIVE STATEMENT: Patient reports that she received a referral to an orthopedic Dr, possibly  for an injection in Alejandra Mcmahon neck. Alejandra Mcmahon pain is better overall, but was bad this weekend and hurts now in Alejandra Mcmahon neck, no radiating, but sore on R cerv paraspinals  Hand dominance: Right  PERTINENT HISTORY:  Per referring physician note: General anxiety D/O, mod depression, panic attack 12/25/22  HPI Alejandra Mcmahon presents for neck pain starting on 11/02/22. It got better on prednisone, but came back once off of the prednisone. Worst first thing in the morning. Stiff all day. Goes to shoulders and to nuchal ridge. Pain ranges 6-8/10 Cervical radiculopathy at C6 -  MR CERVICAL SPINE WO CONTRAST; Future -     Ambulatory referral to Physical Therapy  PAIN:  Are you having pain? Yes: NPRS scale: 7/10 Pain location: R side  of neck, moves into R upper quadrant Pain description: burning Aggravating factors: Looking down, gets stiff, neck rotation Relieving factors: Muscle relaxer  PRECAUTIONS: None  WEIGHT BEARING RESTRICTIONS: No  FALLS:  Has patient fallen in last 6 months? Yes. 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  PLOF: Independent  PATIENT GOALS: Learn how to control the pain.  NEXT MD VISIT: Some time after MRI, which is scheduled for June 20th  OBJECTIVE:   DIAGNOSTIC FINDINGS:  Cerv X ray 11/07/22 IMPRESSION: Degenerative changes. No acute osseous abnormalities.  Cervical MRI 01/10/23: IMPRESSION: 1. Multilevel cervical spondylosis as described above. Moderate right neuroforaminal stenosis at C3-C4 secondary to severe right facet arthropathy with prominent degenerative perifacet marrow edema that could be a source of acute pain. 2. Moderate left neuroforaminal stenosis at C4-C5. 3. Small focus of  increased T2 signal in the central cord at C3-C4, likely myelomalacia.  PATIENT SURVEYS:  FOTO 44.6  COGNITION: Overall cognitive status: Within functional limits for tasks assessed  SENSATION: Reports some intermittent tingling and numbness in B hands when she stays in one position for too long.  POSTURE: rounded shoulders, forward head, and increased lumbar lordosis  PALPATION: Mild tightness and TTP on R cerv paraspinals, UT, B pects   CERVICAL ROM:   Active ROM A/PROM (deg) eval  Flexion 80% P  Extension 30% P  Right lateral flexion 40% P  Left lateral flexion 60% P on R  Right rotation 40% P  Left rotation 80%   (Blank rows = not tested)  UPPER EXTREMITY ROM: B shoulder flex and abd WFL, but causes pain in neck.  UPPER EXTREMITY MMT: B shoulder flex and abd strength limited by pain in neck, otherwise WNL  CERVICAL SPECIAL TESTS:  Upper limb tension test (ULTT): Positive, Spurling's test: Negative, and Distraction test: Positive  TODAY'S TREATMENT:                                                                                                                              DATE:  01/28/23 NuStep L5 x 6 minutes Standing chin tuck with ball on wall, Horizontal shoulder abd with green T band, B punch with chin tuck, G tband resistance, STM to cervical paraspinals for spasm Seated lat pulls, 20#, 2 x 10, rows, 20#, 2 x 10 Cervical mechanical traction 12# x 5 minutes and 14# x 5 minutes.  01/21/23 NuStep L5 x 6 min Seated lower cerv/UT stretch, 3 x 15 seconds Seated lat press with hands  behind hips, 10 x 3-5 sec holds Standing with back to wall, chin tuck to hold ball on wall, performing shoulder ext and rows, 2 x 10 reps with G tband. Squats with 7# dumb bell, mod VC for correct technique. Paloff press, 10#, 2 x 10 reps each side Doorway stretch for pects Bent over row with 3# in each hand, 2 x 10 reps Dead Bug- single arm, single leg x 5 each. Opposite arm and leg, 2  x 5, challenging. Ball on wall single arm circles x 5 in each direction Forward flex over ball stretch x 5 forward and then to each side.  01/14/23 NuStep L5 x 6 minutes. STM to cervical paraspinals, BUT and rhomboids Cervical Snags Head pull x 60 sec Cervical flexion-passive stretch Supine chin tucks 10 sec hold, x 10 reps Chair dips with hands on armrests. 10 reps Standing shoulder ext, rows, ER with B tband 2 x 10 reps each 3 Way shoulder elevation with 3# weights in each hand, 10 reps,  Doorway stretch, 4 x 15 sec. Median nerve glide x 4, R. Ball on wall, diamond pattern, 5 x each direction.  12/31/22 Education   PATIENT EDUCATION:  Education details: POC Person educated: Patient Education method: Explanation Education comprehension: verbalized understanding  HOME EXERCISE PROGRAM: [id number], Plus UE neural tension exercises.  ASSESSMENT:  CLINICAL IMPRESSION: Patient got the MRI results. She has some spinal stenosis, received referral to orthopedic Dr for assess. Dr wants Alejandra Mcmahon to continue with PT. Introduced traction today to see if she gets relief. Also continued with strength and stabilization exercises for posture.  OBJECTIVE IMPAIRMENTS: decreased activity tolerance, decreased coordination, decreased endurance, decreased mobility, decreased ROM, decreased strength, increased muscle spasms, impaired flexibility, improper body mechanics, postural dysfunction, and pain.   ACTIVITY LIMITATIONS: carrying, lifting, bending, sleeping, and reach over head  PARTICIPATION LIMITATIONS: meal prep, cleaning, laundry, driving, shopping, and occupation  PERSONAL FACTORS: Past/current experiences are also affecting patient's functional outcome.   REHAB POTENTIAL: Good  CLINICAL DECISION MAKING: Stable/uncomplicated  EVALUATION COMPLEXITY: Low   GOALS: Goals reviewed with patient? Yes  SHORT TERM GOALS: Target date: 01/14/23  I with initial HEP Baseline:  Goal status:  01/14/23-met  LONG TERM GOALS: Target date: 03/11/23  I with final HEP Baseline:  Goal status: INITIAL  2.  Patient will recover full ROM in neck with pain < 3/10 Baseline: Limited in all planes, pain up to 7/10 Goal status: 01/28/23, remains limited, pain up to 6/10 ongoing  3.  Patient will increase B shoulder strength to at least 4+/5 with neck pain < 3/10 Baseline: B shoulder strength 3/5, limited by neck pain with any resistance Goal status: 01/21/23- Strength 4-/5 with mild pain-ongoing  4.  Patient will report the ability to perform a full day of work with neck and shoulder pain < 3/10 Baseline: Lifting restricitons Goal status: INITIAL  5.  Increase FOTO score to at least 60 Baseline: 44.6 Goal status: INITIAL  PLAN:  PT FREQUENCY: 1-2x/week  PT DURATION: 10 weeks  PLANNED INTERVENTIONS: Therapeutic exercises, Therapeutic activity, Neuromuscular re-education, Balance training, Gait training, Patient/Family education, Self Care, Joint mobilization, Dry Needling, Electrical stimulation, Spinal mobilization, Cryotherapy, Moist heat, Ionotophoresis 4mg /ml Dexamethasone, and Manual therapy  PLAN FOR NEXT SESSION: Progress HEP with strength, postural control, and stretching exercises.  Iona Beard, DPT 01/28/2023, 5:58 PM

## 2023-02-04 ENCOUNTER — Ambulatory Visit: Payer: Commercial Managed Care - PPO | Admitting: Physical Therapy

## 2023-02-11 ENCOUNTER — Encounter: Payer: Self-pay | Admitting: Physical Therapy

## 2023-02-11 ENCOUNTER — Ambulatory Visit: Payer: Commercial Managed Care - PPO | Admitting: Physical Therapy

## 2023-02-11 DIAGNOSIS — R278 Other lack of coordination: Secondary | ICD-10-CM

## 2023-02-11 DIAGNOSIS — R293 Abnormal posture: Secondary | ICD-10-CM | POA: Diagnosis not present

## 2023-02-11 DIAGNOSIS — M6281 Muscle weakness (generalized): Secondary | ICD-10-CM

## 2023-02-11 DIAGNOSIS — M5412 Radiculopathy, cervical region: Secondary | ICD-10-CM

## 2023-02-11 DIAGNOSIS — M436 Torticollis: Secondary | ICD-10-CM

## 2023-02-11 NOTE — Therapy (Signed)
OUTPATIENT PHYSICAL THERAPY CERVICAL EVALUATION   Patient Name: Alejandra Mcmahon MRN: [id number] DOB:1976-05-20, 47 y.o., female Today's Date: 02/11/2023  END OF SESSION:    Past Medical History:  Diagnosis Date   Allergy    Depression    GERD (gastroesophageal reflux disease)    Past Surgical History:  Procedure Laterality Date   CESAREAN SECTION     Patient Active Problem List   Diagnosis Date Noted   Neuroforaminal stenosis of cervical spine 01/22/2023   Multilevel cervical spondylosis without myelopathy 01/22/2023   Myelomalacia of cervical cord (HCC) 01/22/2023   Chronic bilateral low back pain without sciatica 10/15/2022   Hyperlipidemia 09/25/2021   Obesity (BMI 30-39.9) 05/27/2018   Encounter for surveillance of contraceptive pills 02/18/2017   GAD (generalized anxiety disorder) 05/09/2015   GERD (gastroesophageal reflux disease) 03/16/2013   Depression 03/16/2013   Allergic rhinitis 03/16/2013    PCP: Junie Spencer, FNP  REFERRING PROVIDER: Mechele Claude, MD   REFERRING DIAG: M54.12 (ICD-10-CM) - Cervical radiculopathy at C6   THERAPY DIAG:  No diagnosis found.  Rationale for Evaluation and Treatment: Rehabilitation  ONSET DATE: 12/05/22  SUBJECTIVE:                                                                                                                                                                                                         SUBJECTIVE STATEMENT: Patient reports that she has an ortho appointment on July 30th. Her neck hurts on the R at base of skull and she has also had some B hip pain recently as she has tried to stand with an upright posture.  Hand dominance: Right  PERTINENT HISTORY:  Per referring physician note: General anxiety D/O, mod depression, panic attack 12/25/22  HPI Alejandra Mcmahon presents for neck pain starting on 11/02/22. It got better on prednisone, but came back once off of the prednisone. Worst first  thing in the morning. Stiff all day. Goes to shoulders and to nuchal ridge. Pain ranges 6-8/10 Cervical radiculopathy at C6 -     MR CERVICAL SPINE WO CONTRAST; Future -     Ambulatory referral to Physical Therapy  PAIN:  Are you having pain? Yes: NPRS scale: 7/10 Pain location: R side  of neck, moves into R upper quadrant Pain description: burning Aggravating factors: Looking down, gets stiff, neck rotation Relieving factors: Muscle relaxer  PRECAUTIONS: None  WEIGHT BEARING RESTRICTIONS: No  FALLS:  Has patient fallen in last 6 months? Yes. 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  PLOF: Independent  PATIENT GOALS: Learn how to control the pain.  NEXT MD VISIT: Some time after MRI, which is scheduled for June 20th  OBJECTIVE:   DIAGNOSTIC FINDINGS:  Cerv X ray 11/07/22 IMPRESSION: Degenerative changes. No acute osseous abnormalities.  Cervical MRI 01/10/23: IMPRESSION: 1. Multilevel cervical spondylosis as described above. Moderate right neuroforaminal stenosis at C3-C4 secondary to severe right facet arthropathy with prominent degenerative perifacet marrow edema that could be a source of acute pain. 2. Moderate left neuroforaminal stenosis at C4-C5. 3. Small focus of increased T2 signal in the central cord at C3-C4, likely myelomalacia.  PATIENT SURVEYS:  FOTO 44.6  COGNITION: Overall cognitive status: Within functional limits for tasks assessed  SENSATION: Reports some intermittent tingling and numbness in B hands when she stays in one position for too long.  POSTURE: rounded shoulders, forward head, and increased lumbar lordosis  PALPATION: Mild tightness and TTP on R cerv paraspinals,  UT, B pects   CERVICAL ROM:   Active ROM A/PROM (deg) eval  Flexion 80% P  Extension 30% P  Right lateral flexion 40% P  Left lateral flexion 60% P on R  Right rotation 40% P  Left rotation 80%   (Blank rows = not tested)  UPPER EXTREMITY ROM: B shoulder flex and abd WFL, but causes pain in neck.  UPPER EXTREMITY MMT: B shoulder flex and abd strength limited by pain in neck, otherwise WNL  CERVICAL SPECIAL TESTS:  Upper limb tension test (ULTT): Positive, Spurling's test: Negative, and Distraction test: Positive  TODAY'S TREATMENT:                                                                                                                              DATE:  02/11/23 UBE L1 x 3 minutes forward and back STM to high cervical paraspinals, emphasizing sub occipital muscles on R as patient reports she has had increased tension here, causing H/S Demonstrated self mobs on tennis ball for suboccipital area Hip flexor stretching in supine on edge of mat, with strap to stretch quad, then prone with strap. Hip strengthening- seated clamshells, sit to stand with G tband resistance, side to side step against G tband, 10 each Overhead shoulder strengthening with 4# weight, walking with arm flexed above head, 2 laps R, 1 lap L. Ball on wall press, 5x in each direction with each arm Paloff press, 10#, 2 x 10 in each direction.   01/28/23 NuStep L5 x 6 minutes Standing chin tuck with ball on wall, Horizontal shoulder abd with green T band, B punch with chin tuck, G tband resistance, STM to cervical paraspinals for spasm Seated lat pulls, 20#, 2 x 10, rows, 20#, 2 x 10  Cervical mechanical traction 12# x 5 minutes and 14# x 5 minutes.  01/21/23 NuStep L5 x 6 min Seated lower cerv/UT stretch, 3 x 15 seconds Seated lat press with hands behind hips, 10 x 3-5 sec holds Standing with back to wall, chin tuck to hold ball on wall, performing shoulder ext and rows, 2 x 10 reps with G tband. Squats  with 7# dumb bell, mod VC for correct technique. Paloff press, 10#, 2 x 10 reps each side Doorway stretch for pects Bent over row with 3# in each hand, 2 x 10 reps Dead Bug- single arm, single leg x 5 each. Opposite arm and leg, 2 x 5, challenging. Ball on wall single arm circles x 5 in each direction Forward flex over ball stretch x 5 forward and then to each side.  01/14/23 NuStep L5 x 6 minutes. STM to cervical paraspinals, BUT and rhomboids Cervical Snags Head pull x 60 sec Cervical flexion-passive stretch Supine chin tucks 10 sec hold, x 10 reps Chair dips with hands on armrests. 10 reps Standing shoulder ext, rows, ER with B tband 2 x 10 reps each 3 Way shoulder elevation with 3# weights in each hand, 10 reps,  Doorway stretch, 4 x 15 sec. Median nerve glide x 4, R. Ball on wall, diamond pattern, 5 x each direction.  12/31/22 Education   PATIENT EDUCATION:  Education details: POC Person educated: Patient Education method: Explanation Education comprehension: verbalized understanding  HOME EXERCISE PROGRAM: [id number], Plus UE neural tension exercises.  ASSESSMENT:  CLINICAL IMPRESSION: Patient has ortho app 7/30. She bought a home traction unit, but has not tried it. She reports some hip pain when trying to stand up tall and walk. She has tight hip flexors. Added some stretch and strengthening for hips today and also educated her to use tennis ball for deep pressure to R suboccipital muscles. HEP updated.  OBJECTIVE IMPAIRMENTS: decreased activity tolerance, decreased coordination, decreased endurance, decreased mobility, decreased ROM, decreased strength, increased muscle spasms, impaired flexibility, improper body mechanics, postural dysfunction, and pain.   ACTIVITY LIMITATIONS: carrying, lifting, bending, sleeping, and reach over head  PARTICIPATION LIMITATIONS: meal prep, cleaning, laundry, driving, shopping, and occupation  PERSONAL FACTORS: Past/current  experiences are also affecting patient's functional outcome.   REHAB POTENTIAL: Good  CLINICAL DECISION MAKING: Stable/uncomplicated  EVALUATION COMPLEXITY: Low   GOALS: Goals reviewed with patient? Yes  SHORT TERM GOALS: Target date: 01/14/23  I with initial HEP Baseline:  Goal status: 01/14/23-met  LONG TERM GOALS: Target date: 03/11/23  I with final HEP Baseline:  Goal status: INITIAL  2.  Patient will recover full ROM in neck with pain < 3/10 Baseline: Limited in all planes, pain up to 7/10 Goal status: 01/28/23, remains limited, pain up to 6/10 ongoing  3.  Patient will increase B shoulder strength to at least 4+/5 with neck pain < 3/10 Baseline: B shoulder strength 3/5, limited by neck pain with any resistance Goal status: 01/21/23- Strength 4-/5 with mild pain-ongoing  4.  Patient will report the ability to perform a full day of work with neck and shoulder pain < 3/10 Baseline: Lifting restricitons Goal status: INITIAL  5.  Increase FOTO score to at least 60 Baseline: 44.6 Goal status: INITIAL  PLAN:  PT FREQUENCY: 1-2x/week  PT DURATION: 10 weeks  PLANNED INTERVENTIONS: Therapeutic exercises, Therapeutic activity, Neuromuscular re-education, Balance training, Gait training, Patient/Family education, Self Care, Joint mobilization, Dry Needling, Electrical stimulation, Spinal mobilization, Cryotherapy, Moist heat, Ionotophoresis 4mg /ml Dexamethasone, and Manual  therapy  PLAN FOR NEXT SESSION: Progress HEP with strength, postural control, and stretching exercises.  Iona Beard, DPT 02/11/2023, 4:26 PM

## 2023-02-18 ENCOUNTER — Encounter: Payer: Self-pay | Admitting: Physical Therapy

## 2023-02-18 ENCOUNTER — Ambulatory Visit: Payer: Commercial Managed Care - PPO | Admitting: Physical Therapy

## 2023-02-18 DIAGNOSIS — M436 Torticollis: Secondary | ICD-10-CM

## 2023-02-18 DIAGNOSIS — R293 Abnormal posture: Secondary | ICD-10-CM

## 2023-02-18 DIAGNOSIS — M5412 Radiculopathy, cervical region: Secondary | ICD-10-CM

## 2023-02-18 DIAGNOSIS — R278 Other lack of coordination: Secondary | ICD-10-CM

## 2023-02-18 DIAGNOSIS — M6281 Muscle weakness (generalized): Secondary | ICD-10-CM

## 2023-02-18 NOTE — Therapy (Signed)
OUTPATIENT PHYSICAL THERAPY CERVICAL EVALUATION   Patient Name: Alejandra Mcmahon MRN: [id number] DOB:1976-03-13, 47 y.o., female Today's Date: 02/18/2023  END OF SESSION:  PT End of Session - 02/18/23 1633     Visit Number 6    Date for PT Re-Evaluation 03/11/23    PT Start Time 1631    PT Stop Time 1710    PT Time Calculation (min) 39 min    Activity Tolerance Patient tolerated treatment well    Behavior During Therapy [facility] for tasks assessed/performed              Past Medical History:  Diagnosis Date   Allergy    Depression    GERD (gastroesophageal reflux disease)    Past Surgical History:  Procedure Laterality Date   CESAREAN SECTION     Patient Active Problem List   Diagnosis Date Noted   Neuroforaminal stenosis of cervical spine 01/22/2023   Multilevel cervical spondylosis without myelopathy 01/22/2023   Myelomalacia of cervical cord (HCC) 01/22/2023   Chronic bilateral low back pain without sciatica 10/15/2022   Hyperlipidemia 09/25/2021   Obesity (BMI 30-39.9) 05/27/2018   Encounter for surveillance of contraceptive pills 02/18/2017   GAD (generalized anxiety disorder) 05/09/2015   GERD (gastroesophageal reflux disease) 03/16/2013   Depression 03/16/2013   Allergic rhinitis 03/16/2013    PCP: Junie Spencer, FNP  REFERRING PROVIDER: Mechele Claude, MD   REFERRING DIAG: M54.12 (ICD-10-CM) - Cervical radiculopathy at C6   THERAPY DIAG:  Abnormal posture  Muscle weakness (generalized)  Neck stiffness  Other lack of coordination  Radiculopathy, cervical region  Rationale for Evaluation and Treatment: Rehabilitation  ONSET DATE: 12/05/22  SUBJECTIVE:                                                                                                                                                                                                         SUBJECTIVE STATEMENT: Patient reports that she has an ortho appointment on July 30th. Her neck  hurts on the R at base of skull and she has also had some B hip pain recently as she has tried to stand with an upright posture.  Hand dominance: Right  PERTINENT HISTORY:  Per referring physician note: General anxiety D/O, mod depression, panic attack 12/25/22  HPI Alejandra Mcmahon presents for neck pain starting on 11/02/22. It got better on prednisone, but came back once off of the prednisone. Worst first thing in the morning. Stiff all day. Goes to shoulders and to nuchal ridge. Pain ranges 6-8/10 Cervical radiculopathy at C6 -  MR CERVICAL SPINE WO CONTRAST; Future -     Ambulatory referral to Physical Therapy  PAIN:  Are you having pain? Yes: NPRS scale: 7/10 Pain location: R side  of neck, moves into R upper quadrant Pain description: burning Aggravating factors: Looking down, gets stiff, neck rotation Relieving factors: Muscle relaxer  PRECAUTIONS: None  WEIGHT BEARING RESTRICTIONS: No  FALLS:  Has patient fallen in last 6 months? Yes. 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  PLOF: Independent  PATIENT GOALS: Learn how to control the pain.  NEXT MD VISIT: Some time after MRI, which is scheduled for June 20th  OBJECTIVE:   DIAGNOSTIC FINDINGS:  Cerv X ray 11/07/22 IMPRESSION: Degenerative changes. No acute osseous abnormalities.  Cervical MRI 01/10/23: IMPRESSION: 1. Multilevel cervical spondylosis as described above. Moderate right neuroforaminal stenosis at C3-C4 secondary to severe right facet arthropathy with prominent degenerative perifacet marrow edema that could be a source of acute pain. 2. Moderate left neuroforaminal stenosis at C4-C5. 3. Small focus of increased T2 signal in  the central cord at C3-C4, likely myelomalacia.  PATIENT SURVEYS:  FOTO 44.6  COGNITION: Overall cognitive status: Within functional limits for tasks assessed  SENSATION: Reports some intermittent tingling and numbness in B hands when she stays in one position for too long.  POSTURE: rounded shoulders, forward head, and increased lumbar lordosis  PALPATION: Mild tightness and TTP on R cerv paraspinals, UT, B pects   CERVICAL ROM:   Active ROM A/PROM (deg) eval  Flexion 80% P  Extension 30% P  Right lateral flexion 40% P  Left lateral flexion 60% P on R  Right rotation 40% P  Left rotation 80%   (Blank rows = not tested)  UPPER EXTREMITY ROM: B shoulder flex and abd WFL, but causes pain in neck.  UPPER EXTREMITY MMT: B shoulder flex and abd strength limited by pain in neck, otherwise WNL  CERVICAL SPECIAL TESTS:  Upper limb tension test (ULTT): Positive, Spurling's test: Negative, and Distraction test: Positive  TODAY'S TREATMENT:                                                                                                                              DATE:  02/18/23 UBE L1 x 3 minutes forward and back ST assessment in lower R lumbar area, just prox to iliac crest. Tightness and pain noted in R QL. Patient educated to using tennis ball over the tight area and then stretching. Standing shoulder ext, rows 10#, 2 x 10 reps each. Squats with 20# kettlebell, 2 x 10 reps Lat pulls, 20#, 2 x 10 Chest press, 20#, 2 x 10 reps Dead lifts, 5#, 2 x 10  reps Thread the needle x 5 on each side. Heel raises on step x 10 reps  02/11/23 UBE L1 x 3 minutes forward and back STM to high cervical paraspinals, emphasizing sub occipital muscles on R as patient reports she has had increased tension here, causing H/S Demonstrated self mobs on tennis ball for suboccipital area Hip flexor stretching in supine on edge of mat, with strap to stretch quad, then prone with strap. Hip  strengthening- seated clamshells, sit to stand with G tband resistance, side to side step against G tband, 10 each Overhead shoulder strengthening with 4# weight, walking with arm flexed above head, 2 laps R, 1 lap L. Ball on wall press, 5x in each direction with each arm Paloff press, 10#, 2 x 10 in each direction.  01/28/23 NuStep L5 x 6 minutes Standing chin tuck with ball on wall, Horizontal shoulder abd with green T band, B punch with chin tuck, G tband resistance, STM to cervical paraspinals for spasm Seated lat pulls, 20#, 2 x 10, rows, 20#, 2 x 10 Cervical mechanical traction 12# x 5 minutes and 14# x 5 minutes.  01/21/23 NuStep L5 x 6 min Seated lower cerv/UT stretch, 3 x 15 seconds Seated lat press with hands behind hips, 10 x 3-5 sec holds Standing with back to wall, chin tuck to hold ball on wall, performing shoulder ext and rows, 2 x 10 reps with G tband. Squats with 7# dumb bell, mod VC for correct technique. Paloff press, 10#, 2 x 10 reps each side Doorway stretch for pects Bent over row with 3# in each hand, 2 x 10 reps Dead Bug- single arm, single leg x 5 each. Opposite arm and leg, 2 x 5, challenging. Ball on wall single arm circles x 5 in each direction Forward flex over ball stretch x 5 forward and then to each side.  01/14/23 NuStep L5 x 6 minutes. STM to cervical paraspinals, BUT and rhomboids Cervical Snags Head pull x 60 sec Cervical flexion-passive stretch Supine chin tucks 10 sec hold, x 10 reps Chair dips with hands on armrests. 10 reps Standing shoulder ext, rows, ER with B tband 2 x 10 reps each 3 Way shoulder elevation with 3# weights in each hand, 10 reps,  Doorway stretch, 4 x 15 sec. Median nerve glide x 4, R. Ball on wall, diamond pattern, 5 x each direction.  12/31/22 Education   PATIENT EDUCATION:  Education details: POC Person educated: Patient Education method: Explanation Education comprehension: verbalized understanding  HOME EXERCISE  PROGRAM: [id number], Plus UE neural tension exercises.  ASSESSMENT:  CLINICAL IMPRESSION: Patient has ortho app 7/30. She bought a home traction unit, but has not tried it. She reports some R sided LBP, feels muscular. Educated her to deep tissue mobs with tennis ball for her LBP. Increased strength challenge with multiple exercises for Upper and Lower body. She reported fatigue, but no increase in pain.  OBJECTIVE IMPAIRMENTS: decreased activity tolerance, decreased coordination, decreased endurance, decreased mobility, decreased ROM, decreased strength, increased muscle spasms, impaired flexibility, improper body mechanics, postural dysfunction, and pain.   ACTIVITY LIMITATIONS: carrying, lifting, bending, sleeping, and reach over head  PARTICIPATION LIMITATIONS: meal prep, cleaning, laundry, driving, shopping, and occupation  PERSONAL FACTORS: Past/current experiences are also affecting patient's functional outcome.   REHAB POTENTIAL: Good  CLINICAL DECISION MAKING: Stable/uncomplicated  EVALUATION COMPLEXITY: Low   GOALS: Goals reviewed with patient? Yes  SHORT TERM GOALS: Target date: 01/14/23  I with initial HEP Baseline:  Goal status:  01/14/23-met  LONG TERM GOALS: Target date: 03/11/23  I with final HEP Baseline:  Goal status: INITIAL  2.  Patient will recover full ROM in neck with pain < 3/10 Baseline: Limited in all planes, pain up to 7/10 Goal status: 02/18/23 Daily neck pain, 5 max, ongoing  3.  Patient will increase B shoulder strength to at least 4+/5 with neck pain < 3/10 Baseline: B shoulder strength 3/5, limited by neck pain with any resistance Goal status: 01/21/23- Strength 4-/5 with mild pain-ongoing  4.  Patient will report the ability to perform a full day of work with neck and shoulder pain < 3/10 Baseline: Lifting restricitons Goal status: 02/18/23-On lifting restrictions of not > 20#, no pain. Has not returned to full activity. Ongoing  5.  Increase  FOTO score to at least 60 Baseline: 44.6 Goal status: INITIAL  PLAN:  PT FREQUENCY: 1-2x/week  PT DURATION: 10 weeks  PLANNED INTERVENTIONS: Therapeutic exercises, Therapeutic activity, Neuromuscular re-education, Balance training, Gait training, Patient/Family education, Self Care, Joint mobilization, Dry Needling, Electrical stimulation, Spinal mobilization, Cryotherapy, Moist heat, Ionotophoresis 4mg /ml Dexamethasone, and Manual therapy  PLAN FOR NEXT SESSION: Progress HEP with strength, postural control, and stretching exercises.  Iona Beard, DPT 02/18/2023, 5:10 PM

## 2023-02-19 ENCOUNTER — Ambulatory Visit: Payer: Commercial Managed Care - PPO | Admitting: Orthopaedic Surgery

## 2023-02-19 DIAGNOSIS — G9589 Other specified diseases of spinal cord: Secondary | ICD-10-CM | POA: Diagnosis not present

## 2023-02-19 DIAGNOSIS — M4802 Spinal stenosis, cervical region: Secondary | ICD-10-CM | POA: Diagnosis not present

## 2023-02-19 DIAGNOSIS — M47812 Spondylosis without myelopathy or radiculopathy, cervical region: Secondary | ICD-10-CM | POA: Diagnosis not present
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
## 2023-02-21 ENCOUNTER — Encounter: Payer: Self-pay | Admitting: Orthopaedic Surgery

## 2023-02-25 ENCOUNTER — Ambulatory Visit: Payer: Commercial Managed Care - PPO | Attending: Family Medicine | Admitting: Physical Therapy

## 2023-02-25 ENCOUNTER — Encounter: Payer: Self-pay | Admitting: Physical Therapy

## 2023-02-25 DIAGNOSIS — M5412 Radiculopathy, cervical region: Secondary | ICD-10-CM | POA: Insufficient documentation

## 2023-02-25 DIAGNOSIS — M6281 Muscle weakness (generalized): Secondary | ICD-10-CM | POA: Diagnosis present

## 2023-02-25 DIAGNOSIS — M436 Torticollis: Secondary | ICD-10-CM | POA: Diagnosis present

## 2023-02-25 DIAGNOSIS — R293 Abnormal posture: Secondary | ICD-10-CM | POA: Insufficient documentation

## 2023-02-25 DIAGNOSIS — R278 Other lack of coordination: Secondary | ICD-10-CM | POA: Diagnosis present

## 2023-02-25 NOTE — Therapy (Signed)
OUTPATIENT PHYSICAL THERAPY CERVICAL EVALUATION   Patient Name: Alejandra Mcmahon MRN: [id number] DOB:[birth year], 47 y.o., female Today's Date: 02/25/2023  END OF SESSION:  PT End of Session - 02/25/23 1635     Visit Number 7    Date for PT Re-Evaluation 03/11/23    PT Start Time 1630    PT Stop Time 1710    PT Time Calculation (min) 40 min    Activity Tolerance Patient tolerated treatment well    Behavior During Therapy [facility] for tasks assessed/performed               Past Medical History:  Diagnosis Date   Allergy    Depression    GERD (gastroesophageal reflux disease)    Past Surgical History:  Procedure Laterality Date   CESAREAN SECTION     Patient Active Problem List   Diagnosis Date Noted   Neuroforaminal stenosis of cervical spine 01/22/2023   Multilevel cervical spondylosis without myelopathy 01/22/2023   Myelomalacia of cervical cord (HCC) 01/22/2023   Chronic bilateral low back pain without sciatica 10/15/2022   Hyperlipidemia 09/25/2021   Obesity (BMI 30-39.9) 05/27/2018   Encounter for surveillance of contraceptive pills 02/18/2017   GAD (generalized anxiety disorder) 05/09/2015   GERD (gastroesophageal reflux disease) 03/16/2013   Depression 03/16/2013   Allergic rhinitis 03/16/2013    PCP: Junie Spencer, FNP  REFERRING PROVIDER: Mechele Claude, MD   REFERRING DIAG: M54.12 (ICD-10-CM) - Cervical radiculopathy at C6   THERAPY DIAG:  Abnormal posture  Muscle weakness (generalized)  Neck stiffness  Radiculopathy, cervical region  Other lack of coordination  Rationale for Evaluation and Treatment: Rehabilitation  ONSET DATE: 12/05/22  SUBJECTIVE:                                                                                                                                                                                                         SUBJECTIVE STATEMENT: Patient reports that she is going to need surgery for her neck.    Hand dominance: Right  PERTINENT HISTORY:  Per referring physician note: General anxiety D/O, mod depression, panic attack 12/25/22  HPI Alejandra Mcmahon presents for neck pain starting on 11/02/22. It got better on prednisone, but came back once off of the prednisone. Worst first thing in the morning. Stiff all day. Goes to shoulders and to nuchal ridge. Pain ranges 6-8/10 Cervical radiculopathy at C6 -     MR CERVICAL SPINE WO CONTRAST; Future -     Ambulatory referral to Physical Therapy  PAIN:  Are you having pain?  Yes: NPRS scale: 7/10 Pain location: R side  of neck, moves into R upper quadrant Pain description: burning Aggravating factors: Looking down, gets stiff, neck rotation Relieving factors: Muscle relaxer  PRECAUTIONS: None  WEIGHT BEARING RESTRICTIONS: No  FALLS:  Has patient fallen in last 6 months? Yes. 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  PLOF: Independent  PATIENT GOALS: Learn how to control the pain.  NEXT MD VISIT: Some time after MRI, which is scheduled for June 20th  OBJECTIVE:   DIAGNOSTIC FINDINGS:  Cerv X ray 11/07/22 IMPRESSION: Degenerative changes. No acute osseous abnormalities.  Cervical MRI 01/10/23: IMPRESSION: 1. Multilevel cervical spondylosis as described above. Moderate right neuroforaminal stenosis at C3-C4 secondary to severe right facet arthropathy with prominent degenerative perifacet marrow edema that could be a source of acute pain. 2. Moderate left neuroforaminal stenosis at C4-C5. 3. Small focus of increased T2 signal in the central cord at C3-C4, likely myelomalacia.  PATIENT SURVEYS:  FOTO 44.6  COGNITION: Overall cognitive status: Within  functional limits for tasks assessed  SENSATION: Reports some intermittent tingling and numbness in B hands when she stays in one position for too long.  POSTURE: rounded shoulders, forward head, and increased lumbar lordosis  PALPATION: Mild tightness and TTP on R cerv paraspinals, UT, B pects   CERVICAL ROM:   Active ROM A/PROM (deg) eval  Flexion 80% P  Extension 30% P  Right lateral flexion 40% P  Left lateral flexion 60% P on R  Right rotation 40% P  Left rotation 80%   (Blank rows = not tested)  UPPER EXTREMITY ROM: B shoulder flex and abd WFL, but causes pain in neck.  UPPER EXTREMITY MMT: B shoulder flex and abd strength limited by pain in neck, otherwise WNL  CERVICAL SPECIAL TESTS:  Upper limb tension test (ULTT): Positive, Spurling's test: Negative, and Distraction test: Positive  TODAY'S TREATMENT:                                                                                                                              DATE:  02/25/23 NuStep L1 x 3 min forward and back Shoulder protraction leaning into wall. Wall taps with Red Tband around wrists. Bent over row, shoulder ext, horizontal abd with 3# weight, 10 each 3 way shoulder elevation with 3# weights x 10 Shoulder IR with G tband x 10 MH to neck for pain and spasm relief x 10 minutes.  02/18/23 UBE L1 x 3 minutes forward and back ST assessment in lower R lumbar area, just prox to iliac crest. Tightness and pain noted in R QL. Patient educated to using tennis ball over the tight area and then  stretching. Standing shoulder ext, rows 10#, 2 x 10 reps each. Squats with 20# kettlebell, 2 x 10 reps Lat pulls, 20#, 2 x 10 Chest press, 20#, 2 x 10 reps Dead lifts, 5#, 2 x 10 reps Thread the needle x 5 on each side. Heel raises on step x 10 reps  02/11/23 UBE L1 x 3 minutes forward and back STM to high cervical paraspinals, emphasizing sub occipital muscles on R as patient reports she has had increased  tension here, causing H/S Demonstrated self mobs on tennis ball for suboccipital area Hip flexor stretching in supine on edge of mat, with strap to stretch quad, then prone with strap. Hip strengthening- seated clamshells, sit to stand with G tband resistance, side to side step against G tband, 10 each Overhead shoulder strengthening with 4# weight, walking with arm flexed above head, 2 laps R, 1 lap L. Ball on wall press, 5x in each direction with each arm Paloff press, 10#, 2 x 10 in each direction.  01/28/23 NuStep L5 x 6 minutes Standing chin tuck with ball on wall, Horizontal shoulder abd with green T band, B punch with chin tuck, G tband resistance, STM to cervical paraspinals for spasm Seated lat pulls, 20#, 2 x 10, rows, 20#, 2 x 10 Cervical mechanical traction 12# x 5 minutes and 14# x 5 minutes.  01/21/23 NuStep L5 x 6 min Seated lower cerv/UT stretch, 3 x 15 seconds Seated lat press with hands behind hips, 10 x 3-5 sec holds Standing with back to wall, chin tuck to hold ball on wall, performing shoulder ext and rows, 2 x 10 reps with G tband. Squats with 7# dumb bell, mod VC for correct technique. Paloff press, 10#, 2 x 10 reps each side Doorway stretch for pects Bent over row with 3# in each hand, 2 x 10 reps Dead Bug- single arm, single leg x 5 each. Opposite arm and leg, 2 x 5, challenging. Ball on wall single arm circles x 5 in each direction Forward flex over ball stretch x 5 forward and then to each side.  01/14/23 NuStep L5 x 6 minutes. STM to cervical paraspinals, BUT and rhomboids Cervical Snags Head pull x 60 sec Cervical flexion-passive stretch Supine chin tucks 10 sec hold, x 10 reps Chair dips with hands on armrests. 10 reps Standing shoulder ext, rows, ER with B tband 2 x 10 reps each 3 Way shoulder elevation with 3# weights in each hand, 10 reps,  Doorway stretch, 4 x 15 sec. Median nerve glide x 4, R. Ball on wall, diamond pattern, 5 x each  direction.  12/31/22 Education   PATIENT EDUCATION:  Education details: POC Person educated: Patient Education method: Explanation Education comprehension: verbalized understanding  HOME EXERCISE PROGRAM: [id number], Plus UE neural tension exercises.  ASSESSMENT:  CLINICAL IMPRESSION: Patient returns from ortho appointment reporting the need for neck surgery. It has not yet been scheduled. Discussed her PT goal status with the new plan. Patient and therapist agree that she would benefit greatly from continuing to perform stretching and strengthening activities as tolerated leading up to her surgery. HEP updated to include additional strengthening.  OBJECTIVE IMPAIRMENTS: decreased activity tolerance, decreased coordination, decreased endurance, decreased mobility, decreased ROM, decreased strength, increased muscle spasms, impaired flexibility, improper body mechanics, postural dysfunction, and pain.   ACTIVITY LIMITATIONS: carrying, lifting, bending, sleeping, and reach over head  PARTICIPATION LIMITATIONS: meal prep, cleaning, laundry, driving, shopping, and occupation  PERSONAL FACTORS: Past/current experiences are also affecting patient's functional  outcome.   REHAB POTENTIAL: Good  CLINICAL DECISION MAKING: Stable/uncomplicated  EVALUATION COMPLEXITY: Low   GOALS: Goals reviewed with patient? Yes  SHORT TERM GOALS: Target date: 01/14/23  I with initial HEP Baseline:  Goal status: 01/14/23-met  LONG TERM GOALS: Target date: 03/11/23  I with final HEP Baseline:  Goal status: INITIAL  2.  Patient will recover full ROM in neck with pain < 3/10 Baseline: Limited in all planes, pain up to 7/10 Goal status: 02/18/23 Daily neck pain, 5 max, ongoing  3.  Patient will increase B shoulder strength to at least 4+/5 with neck pain < 3/10 Baseline: B shoulder strength 3/5, limited by neck pain with any resistance Goal status: 01/21/23- Strength 4-/5 with mild pain-ongoing  4.   Patient will report the ability to perform a full day of work with neck and shoulder pain < 3/10 Baseline: Lifting restricitons Goal status: 02/18/23-On lifting restrictions of not > 20#, no pain. Has not returned to full activity. Ongoing  5.  Increase FOTO score to at least 60 Baseline: 44.6 Goal status: INITIAL  PLAN:  PT FREQUENCY: 1-2x/week  PT DURATION: 10 weeks  PLANNED INTERVENTIONS: Therapeutic exercises, Therapeutic activity, Neuromuscular re-education, Balance training, Gait training, Patient/Family education, Self Care, Joint mobilization, Dry Needling, Electrical stimulation, Spinal mobilization, Cryotherapy, Moist heat, Ionotophoresis 4mg /ml Dexamethasone, and Manual therapy  PLAN FOR NEXT SESSION: Progress HEP with strength, postural control, and stretching exercises.  Iona Beard, DPT 02/25/2023, 5:05 PM

## 2023-03-01 ENCOUNTER — Other Ambulatory Visit: Payer: Self-pay | Admitting: Family

## 2023-03-01 DIAGNOSIS — E785 Hyperlipidemia, unspecified: Secondary | ICD-10-CM

## 2023-03-01 NOTE — Telephone Encounter (Signed)
[facility] NTBS in Sept for 6 mos FU RF sent to pharmacy

## 2023-03-06 ENCOUNTER — Ambulatory Visit: Payer: Commercial Managed Care - PPO | Admitting: Physical Therapy

## 2023-03-06 ENCOUNTER — Encounter: Payer: Self-pay | Admitting: Physical Therapy

## 2023-03-06 DIAGNOSIS — R278 Other lack of coordination: Secondary | ICD-10-CM

## 2023-03-06 DIAGNOSIS — R293 Abnormal posture: Secondary | ICD-10-CM

## 2023-03-06 DIAGNOSIS — M6281 Muscle weakness (generalized): Secondary | ICD-10-CM

## 2023-03-06 DIAGNOSIS — M5412 Radiculopathy, cervical region: Secondary | ICD-10-CM

## 2023-03-06 DIAGNOSIS — M436 Torticollis: Secondary | ICD-10-CM

## 2023-03-06 NOTE — Therapy (Signed)
OUTPATIENT PHYSICAL THERAPY CERVICAL EVALUATION   Patient Name: Alejandra Mcmahon MRN: [id number] DOB:[birth year], 47 y.o., female Today's Date: 03/06/2023  END OF SESSION:  PT End of Session - 03/06/23 1625     Visit Number 8    Date for PT Re-Evaluation 03/11/23    PT Start Time 1425    PT Stop Time 1505    PT Time Calculation (min) 40 min    Activity Tolerance Patient tolerated treatment well    Behavior During Therapy [facility] for tasks assessed/performed               Past Medical History:  Diagnosis Date   Allergy    Depression    GERD (gastroesophageal reflux disease)    Past Surgical History:  Procedure Laterality Date   CESAREAN SECTION     Patient Active Problem List   Diagnosis Date Noted   Neuroforaminal stenosis of cervical spine 01/22/2023   Multilevel cervical spondylosis without myelopathy 01/22/2023   Myelomalacia of cervical cord (HCC) 01/22/2023   Chronic bilateral low back pain without sciatica 10/15/2022   Hyperlipidemia 09/25/2021   Obesity (BMI 30-39.9) 05/27/2018   Encounter for surveillance of contraceptive pills 02/18/2017   GAD (generalized anxiety disorder) 05/09/2015   GERD (gastroesophageal reflux disease) 03/16/2013   Depression 03/16/2013   Allergic rhinitis 03/16/2013    PCP: Alejandra Spencer, FNP  REFERRING PROVIDER: Mechele Claude, MD   REFERRING DIAG: M54.12 (ICD-10-CM) - Cervical radiculopathy at C6   THERAPY DIAG:  Abnormal posture  Muscle weakness (generalized)  Neck stiffness  Other lack of coordination  Radiculopathy, cervical region  Rationale for Evaluation and Treatment: Rehabilitation  ONSET DATE: 12/05/22  SUBJECTIVE:                                                                                                                                                                                                         SUBJECTIVE STATEMENT: Patient notes that as long she is moving or sitting with her head  supported she is ok, but she has had to sit without head support for several hours during meetings and has increased pain.   Hand dominance: Right  PERTINENT HISTORY:  Per referring physician note: General anxiety D/O, mod depression, panic attack 12/25/22  HPI Alejandra Mcmahon presents for neck pain starting on 11/02/22. It got better on prednisone, but came back once off of the prednisone. Worst first thing in the morning. Stiff all day. Goes to shoulders and to nuchal ridge. Pain ranges 6-8/10 Cervical radiculopathy at C6 -  MR CERVICAL SPINE WO CONTRAST; Future -     Ambulatory referral to Physical Therapy  PAIN:  Are you having pain? Yes: NPRS scale: 7/10 Pain location: R side  of neck, moves into R upper quadrant Pain description: burning Aggravating factors: Looking down, gets stiff, neck rotation Relieving factors: Muscle relaxer  PRECAUTIONS: None  WEIGHT BEARING RESTRICTIONS: No  FALLS:  Has patient fallen in last 6 months? Yes. 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  PLOF: Independent  PATIENT GOALS: Learn how to control the pain.  NEXT MD VISIT: Some time after MRI, which is scheduled for June 20th  OBJECTIVE:   DIAGNOSTIC FINDINGS:  Cerv X ray 11/07/22 IMPRESSION: Degenerative changes. No acute osseous abnormalities.  Cervical MRI 01/10/23: IMPRESSION: 1. Multilevel cervical spondylosis as described above. Moderate right neuroforaminal stenosis at C3-C4 secondary to severe right facet arthropathy with prominent degenerative perifacet marrow edema that could be a source of acute pain. 2. Moderate left neuroforaminal stenosis at C4-C5. 3. Small focus of increased T2 signal in the  central cord at C3-C4, likely myelomalacia.  PATIENT SURVEYS:  FOTO 44.6  COGNITION: Overall cognitive status: Within functional limits for tasks assessed  SENSATION: Reports some intermittent tingling and numbness in B hands when she stays in one position for too long.  POSTURE: rounded shoulders, forward head, and increased lumbar lordosis  PALPATION: Mild tightness and TTP on R cerv paraspinals, UT, B pects   CERVICAL ROM:   Active ROM A/PROM (deg) eval  Flexion 80% P  Extension 30% P  Right lateral flexion 40% P  Left lateral flexion 60% P on R  Right rotation 40% P  Left rotation 80%   (Blank rows = not tested)  UPPER EXTREMITY ROM: B shoulder flex and abd WFL, but causes pain in neck.  UPPER EXTREMITY MMT: B shoulder flex and abd strength limited by pain in neck, otherwise WNL  CERVICAL SPECIAL TESTS:  Upper limb tension test (ULTT): Positive, Spurling's test: Negative, and Distraction test: Positive  TODAY'S TREATMENT:                                                                                                                              DATE:  03/06/23 NuStep L5 x 6 minutes Standing with chin tuck to hold ball on wall, shoulder ext, rows, ER against G Tband, 2 x 10 each Lat pulls, 20#, 2 x 10 reps Seated row, 20#, 2 x 10 reps Chest press, 20#, 2 x 10 reps Squats, 20# to touch target 8" off floor, 2 x 10 reps Dead lifts, 8#, to 8" target, 2 x 10 reps Cat/Cow x 5 with 5 sec Quadruped-Alternating leg kicks,  bird dog, x 10, 3 sec holds. UT stretch 3 x 15 sec each side MH to neck x 10 min for spasm and pain.  02/25/23 NuStep L1 x 3 min forward and back Shoulder protraction leaning into wall. Wall taps with Red Tband around wrists. Bent over row, shoulder ext, horizontal abd with 3# weight, 10 each 3 way shoulder elevation with 3# weights x 10 Shoulder IR with G tband x 10 MH to neck for pain and spasm relief x 10 minutes.  02/18/23 UBE L1 x 3 minutes  forward and back ST assessment in lower R lumbar area, just prox to iliac crest. Tightness and pain noted in R QL. Patient educated to using tennis ball over the tight area and then stretching. Standing shoulder ext, rows 10#, 2 x 10 reps each. Squats with 20# kettlebell, 2 x 10 reps Lat pulls, 20#, 2 x 10 Chest press, 20#, 2 x 10 reps Dead lifts, 5#, 2 x 10 reps Thread the needle x 5 on each side. Heel raises on step x 10 reps  PATIENT EDUCATION:  Education details: POC Person educated: Patient Education method: Explanation Education comprehension: verbalized understanding  HOME EXERCISE PROGRAM: [id number], Plus UE neural tension exercises.  ASSESSMENT:  CLINICAL IMPRESSION: Patient reports some increased pain when she has to sit for long periods without head support. Her HEP is going well. Performed progressive strength exercises for stability with good form.  OBJECTIVE IMPAIRMENTS: decreased activity tolerance, decreased coordination, decreased endurance, decreased mobility, decreased ROM, decreased strength, increased muscle spasms, impaired flexibility, improper body mechanics, postural dysfunction, and pain.   ACTIVITY LIMITATIONS: carrying, lifting, bending, sleeping, and reach over head  PARTICIPATION LIMITATIONS: meal prep, cleaning, laundry, driving, shopping, and occupation  PERSONAL FACTORS: Past/current experiences are also affecting patient's functional outcome.   REHAB POTENTIAL: Good  CLINICAL DECISION MAKING: Stable/uncomplicated  EVALUATION COMPLEXITY: Low   GOALS: Goals reviewed with patient? Yes  SHORT TERM GOALS: Target date: 01/14/23  I with initial HEP Baseline:  Goal status: 01/14/23-met  LONG TERM GOALS: Target date: 03/11/23  I with final HEP Baseline:  Goal status: INITIAL  2.  Patient will recover full ROM in neck with pain < 3/10 Baseline: Limited in all planes, pain up to 7/10 Goal status: 02/18/23 Daily neck pain, 5 max, ongoing  3.   Patient will increase B shoulder strength to at least 4+/5 with neck pain < 3/10 Baseline: B shoulder strength 3/5, limited by neck pain with any resistance Goal status: 01/21/23- Strength 4-/5 with mild pain-ongoing  4.  Patient will report the ability to perform a full day of work with neck and shoulder pain < 3/10 Baseline: Lifting restricitons Goal status: 03/06/23-She does well when moving or sitting with head support, but still notes increased pain when she has to sit for long periods without support, ongoing  5.  Increase FOTO score to at least 60 Baseline: 44.6 Goal status: INITIAL  PLAN:  PT FREQUENCY: 1-2x/week  PT DURATION: 10 weeks  PLANNED INTERVENTIONS: Therapeutic exercises, Therapeutic activity, Neuromuscular re-education, Balance training, Gait training, Patient/Family education, Self Care, Joint mobilization, Dry Needling, Electrical stimulation, Spinal mobilization, Cryotherapy, Moist heat, Ionotophoresis 4mg /ml Dexamethasone, and Manual therapy  PLAN FOR NEXT SESSION: Progress HEP with strength, postural control, and stretching exercises.  Iona Beard, DPT 03/06/2023, 5:10 PM

## 2023-03-08 ENCOUNTER — Other Ambulatory Visit: Payer: Self-pay | Admitting: Family

## 2023-03-08 ENCOUNTER — Other Ambulatory Visit: Payer: Self-pay | Admitting: Physician Assistant

## 2023-03-08 DIAGNOSIS — F411 Generalized anxiety disorder: Secondary | ICD-10-CM

## 2023-03-08 DIAGNOSIS — F321 Major depressive disorder, single episode, moderate: Secondary | ICD-10-CM

## 2023-03-11 ENCOUNTER — Encounter: Payer: Self-pay | Admitting: Physical Therapy

## 2023-03-11 ENCOUNTER — Ambulatory Visit: Payer: Commercial Managed Care - PPO | Admitting: Physical Therapy

## 2023-03-11 DIAGNOSIS — M6281 Muscle weakness (generalized): Secondary | ICD-10-CM

## 2023-03-11 DIAGNOSIS — R278 Other lack of coordination: Secondary | ICD-10-CM

## 2023-03-11 DIAGNOSIS — R293 Abnormal posture: Secondary | ICD-10-CM

## 2023-03-11 DIAGNOSIS — M5412 Radiculopathy, cervical region: Secondary | ICD-10-CM

## 2023-03-11 DIAGNOSIS — M436 Torticollis: Secondary | ICD-10-CM

## 2023-03-11 NOTE — Therapy (Signed)
OUTPATIENT PHYSICAL THERAPY   PHYSICAL THERAPY DISCHARGE SUMMARY  Visits from Start of Care: 9  Current functional level related to goals / functional outcomes: Goals were not met, but patient does have some strategies to manage pain until her surgery.   Remaining deficits: Pain and weakness due to disc herniation   Education / Equipment: HEP, home traction   Patient agrees to discharge. Patient goals were not met. Patient is being discharged due to maximized rehab potential.    Patient Name: CASSANDREA BAGNASCO MRN: [id number] DOB:1976-05-16, 47 y.o., female Today's Date: 03/11/2023  END OF SESSION:  PT End of Session - 03/11/23 1627     Visit Number 9    Date for PT Re-Evaluation 03/11/23    PT Start Time 1626    PT Stop Time 1705    PT Time Calculation (min) 39 min    Activity Tolerance Patient tolerated treatment well    Behavior During Therapy [facility] for tasks assessed/performed            Past Medical History:  Diagnosis Date   Allergy    Depression    GERD (gastroesophageal reflux disease)    Past Surgical History:  Procedure Laterality Date   CESAREAN SECTION     Patient Active Problem List   Diagnosis Date Noted   Neuroforaminal stenosis of cervical spine 01/22/2023   Multilevel cervical spondylosis without myelopathy 01/22/2023   Myelomalacia of cervical cord (HCC) 01/22/2023   Chronic bilateral low back pain without sciatica 10/15/2022   Hyperlipidemia 09/25/2021   Obesity (BMI 30-39.9) 05/27/2018   Encounter for surveillance of contraceptive pills 02/18/2017   GAD (generalized anxiety disorder) 05/09/2015   GERD (gastroesophageal reflux disease) 03/16/2013   Depression 03/16/2013   Allergic rhinitis 03/16/2013    PCP: Junie Spencer, FNP  REFERRING PROVIDER: Mechele Claude, MD   REFERRING DIAG: M54.12 (ICD-10-CM) - Cervical radiculopathy at C6   THERAPY DIAG:  Abnormal posture  Muscle weakness (generalized)  Neck  stiffness  Radiculopathy, cervical region  Other lack of coordination  Rationale for Evaluation and Treatment: Rehabilitation  ONSET DATE: 12/05/22  SUBJECTIVE:                                                                                                                                                                                                         SUBJECTIVE STATEMENT: Patient reports no changes. She does have surgery scheduled.  Hand dominance: Right  PERTINENT HISTORY:  Per referring physician note: General anxiety D/O, mod depression, panic attack 12/25/22  HPI WANNA PARYS presents  for neck pain starting on 11/02/22. It got better on prednisone, but came back once off of the prednisone. Worst first thing in the morning. Stiff all day. Goes to shoulders and to nuchal ridge. Pain ranges 6-8/10 Cervical radiculopathy at C6 -     MR CERVICAL SPINE WO CONTRAST; Future -     Ambulatory referral to Physical Therapy  PAIN:  Are you having pain? Yes: NPRS scale: 7/10 Pain location: R side  of neck, moves into R upper quadrant Pain description: burning Aggravating factors: Looking down, gets stiff, neck rotation Relieving factors: Muscle relaxer  PRECAUTIONS: None  WEIGHT BEARING RESTRICTIONS: No  FALLS:  Has patient fallen in last 6 months? Yes. 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  PLOF: Independent  PATIENT GOALS: Learn how to control the pain.  NEXT MD VISIT: Some time after MRI, which is scheduled for June 20th  OBJECTIVE:   DIAGNOSTIC FINDINGS:  Cerv X ray 11/07/22 IMPRESSION: Degenerative changes. No acute osseous abnormalities.  Cervical MRI 01/10/23: IMPRESSION: 1.  Multilevel cervical spondylosis as described above. Moderate right neuroforaminal stenosis at C3-C4 secondary to severe right facet arthropathy with prominent degenerative perifacet marrow edema that could be a source of acute pain. 2. Moderate left neuroforaminal stenosis at C4-C5. 3. Small focus of increased T2 signal in the central cord at C3-C4, likely myelomalacia.  PATIENT SURVEYS:  FOTO 44.6  COGNITION: Overall cognitive status: Within functional limits for tasks assessed  SENSATION: Reports some intermittent tingling and numbness in B hands when she stays in one position for too long.  POSTURE: rounded shoulders, forward head, and increased lumbar lordosis  PALPATION: Mild tightness and TTP on R cerv paraspinals, UT, B pects   CERVICAL ROM:   Active ROM A/PROM (deg) eval  Flexion 80% P  Extension 30% P  Right lateral flexion 40% P  Left lateral flexion 60% P on R  Right rotation 40% P  Left rotation 80%   (Blank rows = not tested)  UPPER EXTREMITY ROM: B shoulder flex and abd WFL, but causes pain in neck.  UPPER EXTREMITY MMT: B shoulder flex and abd strength limited by pain in neck, otherwise WNL  CERVICAL SPECIAL TESTS:  Upper limb tension test (ULTT): Positive, Spurling's test: Negative, and Distraction test: Positive  TODAY'S TREATMENT:                                                                                                                              DATE:  03/11/23 NuStep L5 x 6 minutes Goal review for D/C-See Goals Sit to stand from mat, on air ex, with 2# weight held at chest. 2 x 10 reps STM to  cervical paraspinals and UT, tighter on R, with stretch. MH to neck for pain relief x 12 min.  03/06/23 NuStep L5 x 6 minutes Standing with chin tuck to hold ball on wall, shoulder ext, rows, ER against G Tband, 2 x 10 each Lat pulls, 20#, 2 x 10 reps Seated row, 20#, 2 x 10 reps Chest press, 20#, 2 x 10 reps Squats, 20# to touch target 8" off  floor, 2 x 10 reps Dead lifts, 8#, to 8" target, 2 x 10 reps Cat/Cow x 5 with 5 sec Quadruped-Alternating leg kicks, bird dog, x 10, 3 sec holds. UT stretch 3 x 15 sec each side MH to neck x 10 min for spasm and pain.  02/25/23 NuStep L1 x 3 min forward and back Shoulder protraction leaning into wall. Wall taps with Red Tband around wrists. Bent over row, shoulder ext, horizontal abd with 3# weight, 10 each 3 way shoulder elevation with 3# weights x 10 Shoulder IR with G tband x 10 MH to neck for pain and spasm relief x 10 minutes.  02/18/23 UBE L1 x 3 minutes forward and back ST assessment in lower R lumbar area, just prox to iliac crest. Tightness and pain noted in R QL. Patient educated to using tennis ball over the tight area and then stretching. Standing shoulder ext, rows 10#, 2 x 10 reps each. Squats with 20# kettlebell, 2 x 10 reps Lat pulls, 20#, 2 x 10 Chest press, 20#, 2 x 10 reps Dead lifts, 5#, 2 x 10 reps Thread the needle x 5 on each side. Heel raises on step x 10 reps  PATIENT EDUCATION:  Education details: POC Person educated: Patient Education method: Explanation Education comprehension: verbalized understanding  HOME EXERCISE PROGRAM: [id number], Plus UE neural tension exercises.  ASSESSMENT:  CLINICAL IMPRESSION: Patient reports no changes. Feels HEP is complete. Unfortunately her goals were not met, but she does report some relief with the stretches and with her home traction. Therapist encouraged her to perform the strengthening exercises too in prep for her surgery in Sept.  OBJECTIVE IMPAIRMENTS: decreased activity tolerance, decreased coordination, decreased endurance, decreased mobility, decreased ROM, decreased strength, increased muscle spasms, impaired flexibility, improper body mechanics, postural dysfunction, and pain.   ACTIVITY LIMITATIONS: carrying, lifting, bending, sleeping, and reach over head  PARTICIPATION LIMITATIONS: meal prep,  cleaning, laundry, driving, shopping, and occupation  PERSONAL FACTORS: Past/current experiences are also affecting patient's functional outcome.   REHAB POTENTIAL: Good  CLINICAL DECISION MAKING: Stable/uncomplicated  EVALUATION COMPLEXITY: Low   GOALS: Goals reviewed with patient? Yes  SHORT TERM GOALS: Target date: 01/14/23  I with initial HEP Baseline:  Goal status: 01/14/23-met  LONG TERM GOALS: Target date: 03/11/23  I with final HEP Baseline:  Goal status: 03/11/23-met  2.  Patient will recover full ROM in neck with pain < 3/10 Baseline: Limited in all planes, pain up to 7/10 Goal status: 03/11/23 Daily neck pain, 5 max, not met  3.  Patient will increase B shoulder strength to at least 4+/5 with neck pain < 3/10 Baseline: B shoulder strength 3/5, limited by neck pain with any resistance Goal status: 03/11/23- Strength 4-/5, limited by pain in her neck, not met  4.  Patient will report the ability to perform a full day of work with neck and shoulder pain < 3/10 Baseline: Lifting restricitons Goal status: 03/11/23-She does well when moving or sitting with head support, but still notes increased pain when she has to sit for long periods  without support. Limited to 10 pounds. Not met  5.  Increase FOTO score to at least 60 Baseline: 44.6 Goal status: 48, not met  PLAN:  PT FREQUENCY: 1-2x/week  PT DURATION: 10 weeks  PLANNED INTERVENTIONS: Therapeutic exercises, Therapeutic activity, Neuromuscular re-education, Balance training, Gait training, Patient/Family education, Self Care, Joint mobilization, Dry Needling, Electrical stimulation, Spinal mobilization, Cryotherapy, Moist heat, Ionotophoresis 4mg /ml Dexamethasone, and Manual therapy  PLAN FOR NEXT SESSION: Progress HEP with strength, postural control, and stretching exercises.  Iona Beard, DPT 03/11/2023, 5:00 PM

## 2023-03-15 ENCOUNTER — Ambulatory Visit: Payer: Commercial Managed Care - PPO | Admitting: Family

## 2023-03-15 ENCOUNTER — Encounter: Payer: Self-pay | Admitting: Family

## 2023-03-15 VITALS — BP 134/79 | HR 99 | Temp 98.2°F | Ht 64.0 in | Wt 190.6 lb

## 2023-03-15 DIAGNOSIS — F321 Major depressive disorder, single episode, moderate: Secondary | ICD-10-CM | POA: Diagnosis not present

## 2023-03-15 DIAGNOSIS — K219 Gastro-esophageal reflux disease without esophagitis: Secondary | ICD-10-CM

## 2023-03-15 DIAGNOSIS — M47812 Spondylosis without myelopathy or radiculopathy, cervical region: Secondary | ICD-10-CM

## 2023-03-15 DIAGNOSIS — M4802 Spinal stenosis, cervical region: Secondary | ICD-10-CM | POA: Diagnosis not present

## 2023-03-15 DIAGNOSIS — E669 Obesity, unspecified: Secondary | ICD-10-CM | POA: Diagnosis not present

## 2023-03-15 DIAGNOSIS — E785 Hyperlipidemia, unspecified: Secondary | ICD-10-CM

## 2023-03-15 DIAGNOSIS — F411 Generalized anxiety disorder: Secondary | ICD-10-CM

## 2023-03-15 MED ORDER — ATORVASTATIN CALCIUM 20 MG PO TABS
ORAL_TABLET | ORAL | 1 refills | Status: DC
Start: 2023-03-15 — End: 2023-08-29

## 2023-03-15 NOTE — Patient Instructions (Signed)

## 2023-03-15 NOTE — Progress Notes (Signed)
Subjective:    Patient ID: Donata Duff, female    DOB: May 02, 1976, 47 y.o.   MRN: [id number]  Chief Complaint  Patient presents with   Medical Management of Chronic Issues   Pt presents to the office today for chronic follow up. States she has constant thirst with the Cymbalta. She has tried Wellbutrin, Lexapro, Pristq, and Celexa.    She is taking OC and last menstrual cycle was 09/29/22. Currently not smoking.   She is scheduled for a cervical discectomy of C3-4 and C4-5 on 04/08/23. Gastroesophageal Reflux She complains of belching and heartburn. This is a chronic problem. The current episode started more than 1 year ago. The problem occurs occasionally. She has tried a diet change for the symptoms. The treatment provided mild relief.  Hyperlipidemia This is a chronic problem. The current episode started more than 1 year ago. Exacerbating diseases include obesity. Current antihyperlipidemic treatment includes statins. The current treatment provides moderate improvement of lipids. Risk factors for coronary artery disease include dyslipidemia and a sedentary lifestyle.  Anxiety Presents for follow-up visit. Symptoms include depressed mood, excessive worry, nervous/anxious behavior and restlessness. Symptoms occur occasionally. The severity of symptoms is moderate.    Depression        This is a chronic problem.  The current episode started more than 1 year ago.   The problem occurs intermittently.  Associated symptoms include restlessness.  Associated symptoms include no helplessness, no hopelessness and not sad.  Past treatments include SNRIs - Serotonin and norepinephrine reuptake inhibitors.  Past medical history includes anxiety.   Neck Pain  This is a chronic problem. The current episode started more than 1 year ago. The problem occurs intermittently. The pain is at a severity of 6/10. The pain is moderate. The symptoms are aggravated by bending (laying down). She has tried  muscle relaxants for the symptoms.      Review of Systems  Gastrointestinal:  Positive for heartburn.  Musculoskeletal:  Positive for neck pain.  Psychiatric/Behavioral:  Positive for depression. The patient is nervous/anxious.   All other systems reviewed and are negative.      Objective:   Physical Exam Vitals reviewed.  Constitutional:      General: She is not in acute distress.    Appearance: She is well-developed.  HENT:     Head: Normocephalic and atraumatic.     Right Ear: Tympanic membrane normal.     Left Ear: Tympanic membrane normal.  Eyes:     Pupils: Pupils are equal, round, and reactive to light.  Neck:     Thyroid: No thyromegaly.  Cardiovascular:     Rate and Rhythm: Normal rate and regular rhythm.     Heart sounds: Normal heart sounds. No murmur heard. Pulmonary:     Effort: Pulmonary effort is normal. No respiratory distress.     Breath sounds: Normal breath sounds. No wheezing.  Abdominal:     General: Bowel sounds are normal. There is no distension.     Palpations: Abdomen is soft.     Tenderness: There is no abdominal tenderness.  Musculoskeletal:        General: Tenderness present.     Cervical back: Normal range of motion and neck supple.     Comments: Pain in cervical with right rotation  Skin:    General: Skin is warm and dry.  Neurological:     Mental Status: She is alert and oriented to person, place, and time.     Cranial  Nerves: No cranial nerve deficit.     Deep Tendon Reflexes: Reflexes are normal and symmetric.  Psychiatric:        Behavior: Behavior normal.        Thought Content: Thought content normal.        Judgment: Judgment normal.      BP 134/79   Pulse 99   Temp 98.2 F (36.8 C) (Temporal)   Ht 5\' 4"  (1.626 m)   Wt 190 lb 9.6 oz (86.5 kg)   SpO2 93%   BMI 32.72 kg/m       Assessment & Plan:  REXINE HINGTGEN comes in today with chief complaint of Medical Management of Chronic Issues   Diagnosis and orders  addressed:  1. Current moderate episode of major depressive disorder, unspecified whether recurrent (HCC) - CMP14+EGFR  2. Obesity (BMI 30-39.9) - CMP14+EGFR  3. Neuroforaminal stenosis of cervical spine - CMP14+EGFR  4. Multilevel cervical spondylosis without myelopathy - CMP14+EGFR  5. Hyperlipidemia, unspecified hyperlipidemia type - atorvastatin (LIPITOR) 20 MG tablet; ( GENERIC EQUIVALENT TO LIPITOR) TAKE 1 TABLET BY MOUTH DAILY  Dispense: 90 tablet; Refill: 1 - CMP14+EGFR  6. Gastroesophageal reflux disease, unspecified whether esophagitis present  - CMP14+EGFR  7. GAD (generalized anxiety disorder) - CMP14+EGFR   Labs pending Health Maintenance reviewed Diet and exercise encouraged  Follow up plan: 6 months   Jannifer Rodney, FNP

## 2023-03-16 LAB — CMP14+EGFR
ALT: 13 IU/L (ref 0–32)
AST: 15 IU/L (ref 0–40)
Albumin: 3.9 g/dL (ref 3.9–4.9)
Alkaline Phosphatase: 52 IU/L (ref 44–121)
BUN/Creatinine Ratio: 16 (ref 9–23)
BUN: 12 mg/dL (ref 6–24)
Bilirubin Total: <0.2 mg/dL (ref 0.0–1.2)
CO2: 22 mmol/L (ref 20–29)
Calcium: 9.1 mg/dL (ref 8.7–10.2)
Chloride: 102 mmol/L (ref 96–106)
Creatinine, Ser: 0.76 mg/dL (ref 0.57–1.00)
Globulin, Total: 2.4 g/dL (ref 1.5–4.5)
Glucose: 84 mg/dL (ref 70–99)
Potassium: 4.6 mmol/L (ref 3.5–5.2)
Sodium: 138 mmol/L (ref 134–144)
Total Protein: 6.3 g/dL (ref 6.0–8.5)
eGFR: 98 mL/min/{1.73_m2} (ref >59)

## 2023-03-18 ENCOUNTER — Encounter: Payer: Self-pay | Admitting: Orthopaedic Surgery

## 2023-03-29 ENCOUNTER — Encounter (HOSPITAL_COMMUNITY): Payer: Self-pay

## 2023-03-29 ENCOUNTER — Encounter (HOSPITAL_COMMUNITY)
Admission: RE | Admit: 2023-03-29 | Discharge: 2023-03-29 | Disposition: A | Discharge status: Home / Self Care | Payer: Commercial Managed Care - PPO | Source: Ambulatory Visit | Attending: Orthopaedic Surgery | Admitting: Orthopaedic Surgery

## 2023-03-29 ENCOUNTER — Other Ambulatory Visit: Payer: Self-pay

## 2023-03-29 VITALS — BP 139/97 | HR 109 | Temp 98.2°F | Resp 18 | Ht 64.0 in | Wt 189.1 lb

## 2023-03-29 DIAGNOSIS — Z01818 Encounter for other preprocedural examination: Secondary | ICD-10-CM

## 2023-03-29 DIAGNOSIS — Z01812 Encounter for preprocedural laboratory examination: Secondary | ICD-10-CM | POA: Insufficient documentation

## 2023-03-29 HISTORY — DX: Anxiety disorder, unspecified: F41.9

## 2023-03-29 HISTORY — DX: Unspecified osteoarthritis, unspecified site: M19.90

## 2023-03-29 LAB — CBC
HCT: 41.2 % (ref 36.0–46.0)
Hemoglobin: 13.7 g/dL (ref 12.0–15.0)
MCH: 30.5 pg (ref 26.0–34.0)
MCHC: 33.3 g/dL (ref 30.0–36.0)
MCV: 91.8 fL (ref 80.0–100.0)
Platelets: 308 10*3/uL (ref 150–400)
RBC: 4.49 MIL/uL (ref 3.87–5.11)
RDW: 11.8 % (ref 11.5–15.5)
WBC: 9.1 10*3/uL (ref 4.0–10.5)
nRBC: 0 % (ref 0.0–0.2)

## 2023-03-29 LAB — SURGICAL PCR SCREEN
MRSA, PCR: NEGATIVE
Staphylococcus aureus: NEGATIVE

## 2023-03-29 NOTE — Progress Notes (Signed)
Surgical Instructions   Your procedure is scheduled on September 16. Report to [facility] Main Entrance "A" at 5:30 A.M., then check in with the Admitting office. Any questions or running late day of surgery: call [phone]  Questions prior to your surgery date: call [phone], Monday-Friday, 8am-4pm. If you experience any cold or flu symptoms such as cough, fever, chills, shortness of breath, etc. between now and your scheduled surgery, please notify us at the above number.     Remember:  Do not eat after midnight the night before your surgery  You may drink clear liquids until 4:30am the morning of your surgery.   Clear liquids allowed are: Water, Non-Citrus Juices (without pulp), Carbonated Beverages, Clear Tea, Black Coffee Only (NO MILK, CREAM OR POWDERED CREAMER of any kind), and Gatorade.  The night before surgery:  No food after midnight. ONLY clear liquids after midnight  The day of surgery (if you do NOT have diabetes):  Drink ONE (1) Pre-Surgery Clear Ensure by 4:30am the morning of surgery. Drink in one sitting. Do not sip.  This drink was given to you during your hospital  pre-op appointment visit.  Nothing else to drink after completing the  Pre-Surgery Clear Ensure.        If you have questions, please contact your surgeon's office.    Take these medicines the morning of surgery with A SIP OF WATER  atorvastatin (LIPITOR)  busPIRone (BUSPAR)  DULoxetine (CYMBALTA)  norgestimate-ethinyl estradiol (MILI)   May take these medicines IF NEEDED: acetaminophen (TYLENOL)  fluticasone (FLONASE)   One week prior to surgery, STOP taking any Aspirin (unless otherwise instructed by your surgeon) diclofenac Sodium (VOLTAREN) 1 % GEL,Aleve, Naproxen, Ibuprofen, Motrin, Advil, Goody's, BC's, all herbal medications, fish oil, and non-prescription vitamins.                     Do NOT Smoke (Tobacco/Vaping) for 24 hours prior to your procedure.  If you use a CPAP at  night, you may bring your mask/headgear for your overnight stay.   You will be asked to remove any contacts, glasses, piercing's, hearing aid's, dentures/partials prior to surgery. Please bring cases for these items if needed.    Patients discharged the day of surgery will not be allowed to drive home, and someone needs to stay with them for 24 hours.  SURGICAL WAITING ROOM VISITATION Patients may have no more than 2 support people in the waiting area - these visitors may rotate.   Pre-op nurse will coordinate an appropriate time for 1 ADULT support person, who may not rotate, to accompany patient in pre-op.  Children under the age of 64 must have an adult with them who is not the patient and must remain in the main waiting area with an adult.  If the patient needs to stay at the hospital during part of their recovery, the visitor guidelines for inpatient rooms apply.  Please refer to the [facility] website for the visitor guidelines for any additional information.   If you received a COVID test during your pre-op visit  it is requested that you wear a mask when out in public, stay away from anyone that may not be feeling well and notify your surgeon if you develop symptoms. If you have been in contact with anyone that has tested positive in the last 10 days please notify you surgeon.      Pre-operative 5 CHG Bathing Instructions   You can play a key role in  reducing the risk of infection after surgery. Your skin needs to be as free of germs as possible. You can reduce the number of germs on your skin by washing with CHG (chlorhexidine gluconate) soap before surgery. CHG is an antiseptic soap that kills germs and continues to kill germs even after washing.   DO NOT use if you have an allergy to chlorhexidine/CHG or antibacterial soaps. If your skin becomes reddened or irritated, stop using the CHG and notify one of our RNs at [phone].   Please shower with the CHG soap starting 4 days  before surgery using the following schedule:     Please keep in mind the following:  DO NOT shave, including legs and underarms, starting the day of your first shower.   You may shave your face at any point before/day of surgery.  Place clean sheets on your bed the day you start using CHG soap. Use a clean washcloth (not used since being washed) for each shower. DO NOT sleep with pets once you start using the CHG.   CHG Shower Instructions:  If you choose to wash your hair and private area, wash first with your normal shampoo/soap.  After you use shampoo/soap, rinse your hair and body thoroughly to remove shampoo/soap residue.  Turn the water OFF and apply about 3 tablespoons (45 ml) of CHG soap to a CLEAN washcloth.  Apply CHG soap ONLY FROM YOUR NECK DOWN TO YOUR TOES (washing for 3-5 minutes)  DO NOT use CHG soap on face, private areas, open wounds, or sores.  Pay special attention to the area where your surgery is being performed.  If you are having back surgery, having someone wash your back for you may be helpful. Wait 2 minutes after CHG soap is applied, then you may rinse off the CHG soap.  Pat dry with a clean towel  Put on clean clothes/pajamas   If you choose to wear lotion, please use ONLY the CHG-compatible lotions on the back of this paper.   Additional instructions for the day of surgery: DO NOT APPLY any lotions, deodorants, cologne, or perfumes.   Do not bring valuables to the hospital. [facility] is not responsible for any belongings/valuables. Do not wear nail polish, gel polish, artificial nails, or any other type of covering on natural nails (fingers and toes) Do not wear jewelry or makeup Put on clean/comfortable clothes.  Please brush your teeth.  Ask your nurse before applying any prescription medications to the skin.     CHG Compatible Lotions   Aveeno Moisturizing lotion  Cetaphil Moisturizing Cream  Cetaphil Moisturizing Lotion  Clairol Herbal  Essence Moisturizing Lotion, Dry Skin  Clairol Herbal Essence Moisturizing Lotion, Extra Dry Skin  Clairol Herbal Essence Moisturizing Lotion, Normal Skin  Curel Age Defying Therapeutic Moisturizing Lotion with Alpha Hydroxy  Curel Extreme Care Body Lotion  Curel Soothing Hands Moisturizing Hand Lotion  Curel Therapeutic Moisturizing Cream, Fragrance-Free  Curel Therapeutic Moisturizing Lotion, Fragrance-Free  Curel Therapeutic Moisturizing Lotion, Original Formula  Eucerin Daily Replenishing Lotion  Eucerin Dry Skin Therapy Plus Alpha Hydroxy Crme  Eucerin Dry Skin Therapy Plus Alpha Hydroxy Lotion  Eucerin Original Crme  Eucerin Original Lotion  Eucerin Plus Crme Eucerin Plus Lotion  Eucerin TriLipid Replenishing Lotion  Keri Anti-Bacterial Hand Lotion  Keri Deep Conditioning Original Lotion Dry Skin Formula Softly Scented  Keri Deep Conditioning Original Lotion, Fragrance Free Sensitive Skin Formula  Keri Lotion Fast Absorbing Fragrance Free Sensitive Skin Formula  Keri Lotion Fast Absorbing  Softly Scented Dry Skin Formula  Keri Original Lotion  Keri Skin Renewal Lotion Keri Silky Smooth Lotion  Keri Silky Smooth Sensitive Skin Lotion  Nivea Body Creamy Conditioning Oil  Nivea Body Extra Enriched Lotion  Nivea Body Original Lotion  Nivea Body Sheer Moisturizing Lotion Nivea Crme  Nivea Skin Firming Lotion  NutraDerm 30 Skin Lotion  NutraDerm Skin Lotion  NutraDerm Therapeutic Skin Cream  NutraDerm Therapeutic Skin Lotion  ProShield Protective Hand Cream  Provon moisturizing lotion  Please read over the following fact sheets that you were given.

## 2023-03-29 NOTE — Progress Notes (Signed)
PCP - [name] A. Hawks, FNP Cardiologist - Denies  PPM/ICD - Denies  Chest x-ray - Denies EKG - Denies Stress Test - Denies ECHO - Denies Cardiac Cath - Denies  Sleep Study - Denies  DM: Denies  Blood Thinner Instructions: N/A Aspirin Instructions: N/A  ERAS Protcol - Yes PRE-SURGERY Ensure or G2- Ensure  COVID TEST- No   Anesthesia review: No  Patient denies shortness of breath, fever, cough and chest pain at PAT appointment   All instructions explained to the patient, with a verbal understanding of the material. Patient agrees to go over the instructions while at home for a better understanding.The opportunity to ask questions was provided.

## 2023-04-01 ENCOUNTER — Ambulatory Visit: Payer: Commercial Managed Care - PPO | Admitting: Physician Assistant

## 2023-04-03 ENCOUNTER — Ambulatory Visit: Payer: Commercial Managed Care - PPO | Admitting: Physician Assistant

## 2023-04-03 ENCOUNTER — Encounter: Payer: Self-pay | Admitting: Physician Assistant

## 2023-04-03 VITALS — BP 131/92 | HR 98

## 2023-04-03 DIAGNOSIS — M47812 Spondylosis without myelopathy or radiculopathy, cervical region: Secondary | ICD-10-CM
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
## 2023-04-08 ENCOUNTER — Encounter (HOSPITAL_COMMUNITY)
Admission: RE | Disposition: A | Discharge status: Home / Self Care | Payer: Self-pay | Source: Ambulatory Visit | Attending: Orthopaedic Surgery

## 2023-04-08 ENCOUNTER — Ambulatory Visit (HOSPITAL_BASED_OUTPATIENT_CLINIC_OR_DEPARTMENT_OTHER): Payer: Commercial Managed Care - PPO

## 2023-04-08 ENCOUNTER — Ambulatory Visit (HOSPITAL_COMMUNITY): Payer: Commercial Managed Care - PPO

## 2023-04-08 ENCOUNTER — Other Ambulatory Visit: Payer: Self-pay

## 2023-04-08 ENCOUNTER — Observation Stay (HOSPITAL_COMMUNITY)
Admission: RE | Admit: 2023-04-08 | Discharge: 2023-04-09 | Disposition: A | Discharge status: Home / Self Care | Payer: Commercial Managed Care - PPO | Source: Ambulatory Visit | Attending: Orthopaedic Surgery | Admitting: Orthopaedic Surgery

## 2023-04-08 DIAGNOSIS — M4802 Spinal stenosis, cervical region: Secondary | ICD-10-CM

## 2023-04-08 DIAGNOSIS — M4712 Other spondylosis with myelopathy, cervical region: Secondary | ICD-10-CM | POA: Diagnosis present

## 2023-04-08 DIAGNOSIS — G9589 Other specified diseases of spinal cord: Secondary | ICD-10-CM | POA: Diagnosis not present

## 2023-04-08 DIAGNOSIS — Z981 Arthrodesis status: Principal | ICD-10-CM

## 2023-04-08 DIAGNOSIS — M47812 Spondylosis without myelopathy or radiculopathy, cervical region: Secondary | ICD-10-CM

## 2023-04-08 HISTORY — PX: ANTERIOR CERVICAL DECOMP/DISCECTOMY FUSION: SHX1161

## 2023-04-08 LAB — POCT PREGNANCY, URINE: Preg Test, Ur: NEGATIVE

## 2023-04-08 SURGERY — ANTERIOR CERVICAL DECOMPRESSION/DISCECTOMY FUSION 2 LEVELS
Anesthesia: General | Site: Neck

## 2023-04-08 MED ORDER — CEFAZOLIN SODIUM-DEXTROSE 2-4 GM/100ML-% IV SOLN
2.0000 g | INTRAVENOUS | Status: DC
  Filled 2023-04-08: qty 100

## 2023-04-08 MED ORDER — MENTHOL 3 MG MT LOZG: 1.0000 | LOZENGE | OROMUCOSAL | Status: DC | PRN

## 2023-04-08 MED ORDER — ACETAMINOPHEN 500 MG PO TABS: 1000.0000 mg | ORAL_TABLET | Freq: Four times a day (QID) | ORAL | Status: DC

## 2023-04-08 MED ORDER — OXYCODONE HCL 5 MG PO TABS: 5.0000 mg | ORAL_TABLET | ORAL | Status: DC | PRN

## 2023-04-08 MED ORDER — PHENYLEPHRINE HCL-NACL 20-0.9 MG/250ML-% IV SOLN
INTRAVENOUS | Status: DC | PRN
  Administered 2023-04-08: 50 ug/min via INTRAVENOUS

## 2023-04-08 MED ORDER — ONDANSETRON HCL 4 MG PO TABS: 4.0000 mg | ORAL_TABLET | Freq: Four times a day (QID) | ORAL | Status: DC | PRN

## 2023-04-08 MED ORDER — ACETAMINOPHEN 160 MG/5ML PO SOLN: 1000.0000 mg | Freq: Once | ORAL | Status: DC | PRN

## 2023-04-08 MED ORDER — ROCURONIUM BROMIDE 10 MG/ML (PF) SYRINGE
PREFILLED_SYRINGE | INTRAVENOUS | Status: DC | PRN
  Administered 2023-04-08: 30 mg via INTRAVENOUS
  Administered 2023-04-08: 70 mg via INTRAVENOUS

## 2023-04-08 MED ORDER — OXYCODONE HCL 5 MG PO TABS
10.0000 mg | ORAL_TABLET | ORAL | Status: DC | PRN
  Administered 2023-04-08: 10 mg via ORAL

## 2023-04-08 MED ORDER — METOPROLOL TARTRATE 5 MG/5ML IV SOLN
INTRAVENOUS | Status: DC | PRN
  Administered 2023-04-08 (×2): 1 mg via INTRAVENOUS

## 2023-04-08 MED ORDER — SODIUM CHLORIDE 0.9 % IV SOLN: 250.0000 mL | INTRAVENOUS | Status: DC

## 2023-04-08 MED ORDER — SODIUM BICARBONATE-CITRIC ACID 1650-1000 MG PO TBEF: EFFERVESCENT_TABLET | Freq: Every day | ORAL | Status: DC | PRN

## 2023-04-08 MED ORDER — PHENOL 1.4 % MT LIQD: 1.0000 | OROMUCOSAL | Status: DC | PRN

## 2023-04-08 MED ORDER — METHOCARBAMOL 1000 MG/10ML IJ SOLN: 500.0000 mg | Freq: Four times a day (QID) | INTRAVENOUS | Status: DC | PRN

## 2023-04-08 MED ORDER — METHOCARBAMOL 500 MG PO TABS
500.0000 mg | ORAL_TABLET | Freq: Four times a day (QID) | ORAL | Status: DC | PRN
  Administered 2023-04-08 – 2023-04-09 (×3): 500 mg via ORAL
  Filled 2023-04-08 (×3): qty 1

## 2023-04-08 MED ORDER — FENTANYL CITRATE (PF) 100 MCG/2ML IJ SOLN
25.0000 ug | INTRAMUSCULAR | Status: DC | PRN
  Administered 2023-04-08 (×3): 50 ug via INTRAVENOUS

## 2023-04-08 MED ORDER — ADULT MULTIVITAMIN W/MINERALS CH: 1.0000 | ORAL_TABLET | Freq: Every day | ORAL | Status: DC

## 2023-04-08 MED ORDER — SODIUM CHLORIDE 0.9% FLUSH: 3.0000 mL | Freq: Two times a day (BID) | INTRAVENOUS | Status: DC

## 2023-04-08 MED ORDER — OXYCODONE HCL 5 MG PO TABS
ORAL_TABLET | ORAL | Status: AC
  Filled 2023-04-08: qty 2

## 2023-04-08 MED ORDER — PROPOFOL 10 MG/ML IV BOLUS
INTRAVENOUS | Status: AC
  Filled 2023-04-08: qty 20

## 2023-04-08 MED ORDER — BUSPIRONE HCL 15 MG PO TABS
30.0000 mg | ORAL_TABLET | Freq: Every day | ORAL | Status: DC
  Administered 2023-04-08: 30 mg via ORAL
  Filled 2023-04-08: qty 2

## 2023-04-08 MED ORDER — OXYCODONE HCL 5 MG PO TABS
10.0000 mg | ORAL_TABLET | ORAL | Status: DC | PRN
  Administered 2023-04-08 – 2023-04-09 (×4): 10 mg via ORAL
  Filled 2023-04-08 (×4): qty 2

## 2023-04-08 MED ORDER — SODIUM CHLORIDE 0.9% FLUSH: 3.0000 mL | INTRAVENOUS | Status: DC | PRN

## 2023-04-08 MED ORDER — FENTANYL CITRATE (PF) 100 MCG/2ML IJ SOLN
INTRAMUSCULAR | Status: AC
  Filled 2023-04-08: qty 2

## 2023-04-08 MED ORDER — OXYCODONE HCL 5 MG/5ML PO SOLN: 5.0000 mg | Freq: Once | ORAL | Status: DC | PRN

## 2023-04-08 MED ORDER — ACETAMINOPHEN 325 MG PO TABS: 650.0000 mg | ORAL_TABLET | ORAL | Status: DC | PRN

## 2023-04-08 MED ORDER — CHLORHEXIDINE GLUCONATE 0.12 % MT SOLN
15.0000 mL | Freq: Once | OROMUCOSAL | Status: AC
  Administered 2023-04-08: 15 mL via OROMUCOSAL
  Filled 2023-04-08: qty 15

## 2023-04-08 MED ORDER — CEFAZOLIN SODIUM-DEXTROSE 2-3 GM-%(50ML) IV SOLR
INTRAVENOUS | Status: DC | PRN
Start: 2023-04-08 — End: 2023-04-08
  Administered 2023-04-08: 2 g via INTRAVENOUS

## 2023-04-08 MED ORDER — LIDOCAINE 2% (20 MG/ML) 5 ML SYRINGE
INTRAMUSCULAR | Status: DC | PRN
  Administered 2023-04-08: 60 mg via INTRAVENOUS

## 2023-04-08 MED ORDER — PROPOFOL 10 MG/ML IV BOLUS
INTRAVENOUS | Status: DC | PRN
Start: 2023-04-08 — End: 2023-04-08
  Administered 2023-04-08: 120 mg via INTRAVENOUS

## 2023-04-08 MED ORDER — VITAMIN C 500 MG PO TABS: 500.0000 mg | ORAL_TABLET | Freq: Every day | ORAL | Status: DC

## 2023-04-08 MED ORDER — 0.9 % SODIUM CHLORIDE (POUR BTL) OPTIME
TOPICAL | Status: DC | PRN
  Administered 2023-04-08: 1000 mL

## 2023-04-08 MED ORDER — PHENYLEPHRINE 80 MCG/ML (10ML) SYRINGE FOR IV PUSH (FOR BLOOD PRESSURE SUPPORT)
PREFILLED_SYRINGE | INTRAVENOUS | Status: AC
  Filled 2023-04-08: qty 10

## 2023-04-08 MED ORDER — PHENYLEPHRINE 80 MCG/ML (10ML) SYRINGE FOR IV PUSH (FOR BLOOD PRESSURE SUPPORT)
PREFILLED_SYRINGE | INTRAVENOUS | Status: DC | PRN
  Administered 2023-04-08 (×5): 160 ug via INTRAVENOUS

## 2023-04-08 MED ORDER — MORPHINE SULFATE (PF) 2 MG/ML IV SOLN: 2.0000 mg | INTRAVENOUS | Status: DC | PRN

## 2023-04-08 MED ORDER — ACETAMINOPHEN 650 MG RE SUPP: 650.0000 mg | RECTAL | Status: DC | PRN

## 2023-04-08 MED ORDER — ACETAMINOPHEN 10 MG/ML IV SOLN: 1000.0000 mg | Freq: Once | INTRAVENOUS | Status: DC | PRN

## 2023-04-08 MED ORDER — METOPROLOL TARTRATE 5 MG/5ML IV SOLN
INTRAVENOUS | Status: AC
  Filled 2023-04-08: qty 5

## 2023-04-08 MED ORDER — DEXAMETHASONE SODIUM PHOSPHATE 10 MG/ML IJ SOLN
INTRAMUSCULAR | Status: DC | PRN
  Administered 2023-04-08: 10 mg via INTRAVENOUS

## 2023-04-08 MED ORDER — ACETAMINOPHEN 500 MG PO TABS
1000.0000 mg | ORAL_TABLET | Freq: Four times a day (QID) | ORAL | Status: AC
  Administered 2023-04-08 – 2023-04-09 (×4): 1000 mg via ORAL
  Filled 2023-04-08 (×4): qty 2

## 2023-04-08 MED ORDER — ROCURONIUM BROMIDE 10 MG/ML (PF) SYRINGE
PREFILLED_SYRINGE | INTRAVENOUS | Status: AC
  Filled 2023-04-08: qty 10

## 2023-04-08 MED ORDER — FLUTICASONE PROPIONATE 50 MCG/ACT NA SUSP: 1.0000 | Freq: Every day | NASAL | Status: DC | PRN

## 2023-04-08 MED ORDER — LIDOCAINE 2% (20 MG/ML) 5 ML SYRINGE
INTRAMUSCULAR | Status: AC
  Filled 2023-04-08: qty 5

## 2023-04-08 MED ORDER — FENTANYL CITRATE (PF) 250 MCG/5ML IJ SOLN
INTRAMUSCULAR | Status: DC | PRN
  Administered 2023-04-08: 50 ug via INTRAVENOUS
  Administered 2023-04-08: 150 ug via INTRAVENOUS

## 2023-04-08 MED ORDER — ATORVASTATIN CALCIUM 10 MG PO TABS: 20.0000 mg | ORAL_TABLET | Freq: Every day | ORAL | Status: DC

## 2023-04-08 MED ORDER — METHOCARBAMOL 500 MG PO TABS
ORAL_TABLET | ORAL | Status: AC
  Filled 2023-04-08: qty 1

## 2023-04-08 MED ORDER — MIDAZOLAM HCL 2 MG/2ML IJ SOLN
INTRAMUSCULAR | Status: DC | PRN
  Administered 2023-04-08: 2 mg via INTRAVENOUS

## 2023-04-08 MED ORDER — OXYCODONE HCL 5 MG PO TABS: 5.0000 mg | ORAL_TABLET | Freq: Once | ORAL | Status: DC | PRN

## 2023-04-08 MED ORDER — SUGAMMADEX SODIUM 200 MG/2ML IV SOLN
INTRAVENOUS | Status: DC | PRN
  Administered 2023-04-08: 200 mg via INTRAVENOUS

## 2023-04-08 MED ORDER — DEXAMETHASONE SODIUM PHOSPHATE 10 MG/ML IJ SOLN
INTRAMUSCULAR | Status: AC
  Filled 2023-04-08: qty 1

## 2023-04-08 MED ORDER — DULOXETINE HCL 30 MG PO CPEP: 60.0000 mg | ORAL_CAPSULE | Freq: Every day | ORAL | Status: DC

## 2023-04-08 MED ORDER — NORGESTIMATE-ETH ESTRADIOL 0.25-35 MG-MCG PO TABS: 1.0000 | ORAL_TABLET | Freq: Every day | ORAL | Status: DC

## 2023-04-08 MED ORDER — LACTATED RINGERS IV SOLN: INTRAVENOUS | Status: DC

## 2023-04-08 MED ORDER — ONDANSETRON HCL 4 MG/2ML IJ SOLN
INTRAMUSCULAR | Status: AC
  Filled 2023-04-08: qty 2

## 2023-04-08 MED ORDER — MIDAZOLAM HCL 2 MG/2ML IJ SOLN
INTRAMUSCULAR | Status: AC
  Filled 2023-04-08: qty 2

## 2023-04-08 MED ORDER — BUSPIRONE HCL 15 MG PO TABS: 30.0000 mg | ORAL_TABLET | Freq: Every day | ORAL | Status: DC

## 2023-04-08 MED ORDER — FENTANYL CITRATE (PF) 250 MCG/5ML IJ SOLN
INTRAMUSCULAR | Status: AC
  Filled 2023-04-08: qty 5

## 2023-04-08 MED ORDER — SODIUM CHLORIDE 0.45 % IV SOLN: INTRAVENOUS | Status: DC

## 2023-04-08 MED ORDER — ONDANSETRON HCL 4 MG/2ML IJ SOLN: 4.0000 mg | Freq: Four times a day (QID) | INTRAMUSCULAR | Status: DC | PRN

## 2023-04-08 MED ORDER — ONDANSETRON HCL 4 MG/2ML IJ SOLN
INTRAMUSCULAR | Status: DC | PRN
  Administered 2023-04-08: 4 mg via INTRAVENOUS

## 2023-04-08 MED ORDER — ORAL CARE MOUTH RINSE: 15.0000 mL | Freq: Once | OROMUCOSAL | Status: AC

## 2023-04-08 MED ORDER — ACETAMINOPHEN 500 MG PO TABS: 1000.0000 mg | ORAL_TABLET | Freq: Once | ORAL | Status: DC | PRN

## 2023-04-08 MED ORDER — CALCIUM 500 MG PO TABS: ORAL_TABLET | ORAL | Status: DC

## 2023-04-08 SURGICAL SUPPLY — 57 items
AGENT HMST KT MTR STRL THRMB (HEMOSTASIS)
APL SKNCLS STERI-STRIP NONHPOA (GAUZE/BANDAGES/DRESSINGS) ×1
BAG COUNTER SPONGE SURGICOUNT (BAG) ×1 IMPLANT
BAG SPNG CNTER NS LX DISP (BAG) ×1
BENZOIN TINCTURE PRP APPL 2/3 (GAUZE/BANDAGES/DRESSINGS) ×1 IMPLANT
BIT DRILL SPINE QC 12 (BIT) IMPLANT
BLADE CLIPPER SURG (BLADE) IMPLANT
BONE CC-ACS 11X14X6 6D (Bone Implant) ×1 IMPLANT
BONE CC-ACS 11X14X9 6D (Bone Implant) ×1 IMPLANT
BUR ROUND FLUTED 4 SOFT TCH (BURR) ×1 IMPLANT
CHIPS BONE CANC-ACS11X14X6 6D (Bone Implant) IMPLANT
CHIPS BONE CANC-ACS11X14X9 6D (Bone Implant) IMPLANT
COLLAR CERV LO CONTOUR FIRM DE (SOFTGOODS) IMPLANT
CORD BIPOLAR FORCEPS 12FT (ELECTRODE) ×1 IMPLANT
COVER SURGICAL LIGHT HANDLE (MISCELLANEOUS) ×1 IMPLANT
DRAPE C-ARM 42X72 X-RAY (DRAPES) ×1 IMPLANT
DRAPE HALF SHEET 40X57 (DRAPES) ×1 IMPLANT
DRAPE MICROSCOPE SLANT 54X150 (MISCELLANEOUS) ×1 IMPLANT
DURAPREP 6ML APPLICATOR 50/CS (WOUND CARE) ×1 IMPLANT
ELECT COATED BLADE 2.86 ST (ELECTRODE) ×1 IMPLANT
ELECT REM PT RETURN 9FT ADLT (ELECTROSURGICAL) ×1
ELECTRODE REM PT RTRN 9FT ADLT (ELECTROSURGICAL) ×1 IMPLANT
EVACUATOR 1/8 PVC DRAIN (DRAIN) ×1 IMPLANT
GAUZE SPONGE 4X4 12PLY STRL (GAUZE/BANDAGES/DRESSINGS) ×1 IMPLANT
GLOVE BIOGEL PI IND STRL 8 (GLOVE) ×2 IMPLANT
GLOVE ORTHO TXT STRL SZ7.5 (GLOVE) ×2 IMPLANT
GOWN STRL REUS W/ TWL LRG LVL3 (GOWN DISPOSABLE) ×1 IMPLANT
GOWN STRL REUS W/ TWL XL LVL3 (GOWN DISPOSABLE) ×1 IMPLANT
GOWN STRL REUS W/TWL 2XL LVL3 (GOWN DISPOSABLE) ×1 IMPLANT
GOWN STRL REUS W/TWL LRG LVL3 (GOWN DISPOSABLE) ×1
GOWN STRL REUS W/TWL XL LVL3 (GOWN DISPOSABLE) ×1
HALTER HD/CHIN CERV TRACTION D (MISCELLANEOUS) ×1 IMPLANT
HEMOSTAT SURGICEL 2X14 (HEMOSTASIS) IMPLANT
KIT BASIN OR (CUSTOM PROCEDURE TRAY) ×1 IMPLANT
KIT TURNOVER KIT B (KITS) ×1 IMPLANT
MANIFOLD NEPTUNE II (INSTRUMENTS) IMPLANT
NDL 25GX 5/8IN NON SAFETY (NEEDLE) ×1 IMPLANT
NEEDLE 25GX 5/8IN NON SAFETY (NEEDLE) ×1 IMPLANT
NS IRRIG 1000ML POUR BTL (IV SOLUTION) ×1 IMPLANT
PACK ORTHO CERVICAL (CUSTOM PROCEDURE TRAY) ×1 IMPLANT
PAD ARMBOARD 7.5X6 YLW CONV (MISCELLANEOUS) ×2 IMPLANT
PATTIES SURGICAL .5 X.5 (GAUZE/BANDAGES/DRESSINGS) IMPLANT
PIN TEMP FIXATION KIRSCHNER (EXFIX) IMPLANT
PLATE ANT CERV XTEND 2 LV 30 (Plate) IMPLANT
POSITIONER HEAD DONUT 9IN (MISCELLANEOUS) ×1 IMPLANT
RESTRAINT LIMB HOLDER UNIV (RESTRAINTS) IMPLANT
SCREW XTD VAR 4.2 SELF TAP 12 (Screw) IMPLANT
STRIP CLOSURE SKIN 1/2X4 (GAUZE/BANDAGES/DRESSINGS) ×1 IMPLANT
SURGIFLO W/THROMBIN 8M KIT (HEMOSTASIS) IMPLANT
SUT BONE WAX W31G (SUTURE) ×1 IMPLANT
SUT VIC AB 3-0 X1 27 (SUTURE) ×1 IMPLANT
SUT VICRYL 4-0 PS2 18IN ABS (SUTURE) ×2 IMPLANT
TOWEL GREEN STERILE (TOWEL DISPOSABLE) ×1 IMPLANT
TOWEL GREEN STERILE FF (TOWEL DISPOSABLE) ×1 IMPLANT
TRAY FOLEY W/BAG SLVR 16FR (SET/KITS/TRAYS/PACK)
TRAY FOLEY W/BAG SLVR 16FR ST (SET/KITS/TRAYS/PACK) IMPLANT
TUBING FEATHERFLOW (TUBING) IMPLANT

## 2023-04-08 NOTE — Transfer of Care (Signed)
Immediate Anesthesia Transfer of Care Note  Patient: Alejandra Mcmahon  Procedure(s) Performed: C3-4, C4-5 ANTERIOR CERVICAL DISCECTOMY FUSION, ALLOGRAFT, PLATE (Neck)  Patient Location: PACU  Anesthesia Type:General  Level of Consciousness: awake, alert , and oriented  Airway & Oxygen Therapy: Patient Spontanous Breathing  Post-op Assessment: Report given to RN and Post -op Vital signs reviewed and stable  Post vital signs: Reviewed and stable  Last Vitals:  Vitals Value Taken Time  BP 132/84 04/08/23 1007  Temp    Pulse 111 04/08/23 1008  Resp 24 04/08/23 1008  SpO2 93 % 04/08/23 1008  Vitals shown include unfiled device data.  Last Pain:  Vitals:   04/08/23 0657  TempSrc:   PainSc: 6          Complications: No notable events documented.
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
## 2023-04-08 NOTE — Anesthesia Procedure Notes (Signed)
Procedure Name: Intubation Date/Time: 04/08/2023 7:40 AM  Performed by: Georgianne Fick D, CRNAPre-anesthesia Checklist: Patient identified, Emergency Drugs available, Suction available and Patient being monitored Patient Re-evaluated:Patient Re-evaluated prior to induction Oxygen Delivery Method: Circle System Utilized Preoxygenation: Pre-oxygenation with 100% oxygen Induction Type: IV induction Ventilation: Mask ventilation without difficulty Laryngoscope Size: Glidescope (Elective per procedure) Tube type: Oral Number of attempts: 1 Airway Equipment and Method: Stylet and Oral airway Placement Confirmation: ETT inserted through vocal cords under direct vision, positive ETCO2 and breath sounds checked- equal and bilateral Secured at: 19 cm Tube secured with: Tape Dental Injury: Teeth and Oropharynx as per pre-operative assessment

## 2023-04-08 NOTE — Interval H&P Note (Signed)
History and Physical Interval Note:  04/08/2023 7:14 AM  Alejandra Mcmahon  has presented today for surgery, with the diagnosis of C3-4, C4-5 cervical stenosis, myelomalacia.  The various methods of treatment have been discussed with the patient and family. After consideration of risks, benefits and other options for treatment, the patient has consented to  Procedure(s): C3-4, C4-5 ANTERIOR CERVICAL DISCECTOMY FUSION, ALLOGRAFT, PLATE (N/A) as a surgical intervention.  The patient's history has been reviewed, patient examined, no change in status, stable for surgery.  I have reviewed the patient's chart and labs.  Questions were answered to the patient's satisfaction.     Eldred Manges

## 2023-04-08 NOTE — Progress Notes (Deleted)
Orthopedic Tech Progress Note Patient Details:  Alejandra Mcmahon 01-28-1976 [id number]  Ortho Devices Type of Ortho Device: Radio broadcast assistant Ortho Device/Splint Location: Bi LE Ortho Device/Splint Interventions: Application   Post Interventions Patient Tolerated: Well  Alejandra Mcmahon 04/08/2023, 11:36 AM

## 2023-04-08 NOTE — Anesthesia Preprocedure Evaluation (Signed)
Anesthesia Evaluation  Patient identified by MRN, date of birth, ID band Patient awake    Reviewed: Allergy & Precautions, NPO status , Patient's Chart, lab work & pertinent test results  History of Anesthesia Complications Negative for: history of anesthetic complications  Airway Mallampati: I  TM Distance: >3 FB Neck ROM: Limited    Dental  (+) Dental Advisory Given, Teeth Intact   Pulmonary neg shortness of breath, neg sleep apnea, neg COPD, neg recent URI   breath sounds clear to auscultation       Cardiovascular negative cardio ROS  Rhythm:Regular Rate:Tachycardia     Neuro/Psych  PSYCHIATRIC DISORDERS Anxiety Depression     Neuromuscular disease    GI/Hepatic Neg liver ROS,GERD  ,,  Endo/Other  negative endocrine ROS    Renal/GU negative Renal ROS     Musculoskeletal  (+) Arthritis ,    Abdominal   Peds  Hematology negative hematology ROS (+) Lab Results      Component                Value               Date                      WBC                      9.1                 03/29/2023                HGB                      13.7                03/29/2023                HCT                      41.2                03/29/2023                MCV                      91.8                03/29/2023                PLT                      308                 03/29/2023              Anesthesia Other Findings   Reproductive/Obstetrics Lab Results      Component                Value               Date                      PREGTESTUR               NEGATIVE            04/08/2023  HCG                      <5.0                12/19/2019                                        Anesthesia Physical Anesthesia Plan  ASA: 2  Anesthesia Plan: General   Post-op Pain Management: Ofirmev IV (intra-op)* and Toradol IV (intra-op)*   Induction: Intravenous  PONV Risk Score and Plan:  4 or greater and Ondansetron, Dexamethasone and Midazolam  Airway Management Planned: Oral ETT  Additional Equipment: None  Intra-op Plan:   Post-operative Plan: Extubation in OR  Informed Consent: I have reviewed the patients History and Physical, chart, labs and discussed the procedure including the risks, benefits and alternatives for the proposed anesthesia with the patient or authorized representative who has indicated his/her understanding and acceptance.     Dental advisory given  Plan Discussed with: CRNA  Anesthesia Plan Comments:        Anesthesia Quick Evaluation

## 2023-04-08 NOTE — Progress Notes (Signed)
Orthopedic Tech Progress Note Patient Details:  ALAJA Mcmahon 02/18/1976 [id number] Soft collar for showering was delivered to bedside Ortho Devices Type of Ortho Device: Soft collar Ortho Device/Splint Location: Neck Ortho Device/Splint Interventions: Ordered   Post Interventions Patient Tolerated: Well  [name] 04/08/2023, 11:38 AM

## 2023-04-09 ENCOUNTER — Encounter (HOSPITAL_COMMUNITY): Payer: Self-pay | Admitting: Orthopaedic Surgery

## 2023-04-09 DIAGNOSIS — M4712 Other spondylosis with myelopathy, cervical region: Secondary | ICD-10-CM | POA: Diagnosis not present

## 2023-04-09 MED ORDER — OXYCODONE-ACETAMINOPHEN 5-325 MG PO TABS
1.0000 | ORAL_TABLET | Freq: Four times a day (QID) | ORAL | 0 refills | Status: DC | PRN
Start: 2023-04-09 — End: 2023-04-16

## 2023-04-09 MED ORDER — MENTHOL 3 MG MT LOZG
1.0000 | LOZENGE | OROMUCOSAL | Status: DC | PRN
  Administered 2023-04-09: 3 mg via ORAL
  Filled 2023-04-09: qty 9

## 2023-04-09 MED ORDER — METHOCARBAMOL 500 MG PO TABS: 500.0000 mg | ORAL_TABLET | Freq: Four times a day (QID) | ORAL | 0 refills | Status: DC | PRN

## 2023-04-09 MED ORDER — DIPHENHYDRAMINE HCL 25 MG PO CAPS
25.0000 mg | ORAL_CAPSULE | Freq: Four times a day (QID) | ORAL | Status: AC | PRN
  Administered 2023-04-09: 25 mg via ORAL
  Filled 2023-04-09: qty 1
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
## 2023-04-09 NOTE — Progress Notes (Signed)
Patient ID: Alejandra Mcmahon, female   DOB: [birth year], 47 y.o.   MRN: [id number]   Subjective: 1 Day Post-Op Procedure(s) (LRB): C3-4, C4-5 ANTERIOR CERVICAL DISCECTOMY FUSION, ALLOGRAFT, PLATE (N/A) Patient reports pain as mild.  Itching with tape, change to paper tape by nurse.   Objective: Vital signs in last 24 hours: Temp:  [97.8 F (36.6 C)-98.9 F (37.2 C)] 98.5 F (36.9 C) (09/17 0410) Pulse Rate:  [100-113] 104 (09/17 0410) Resp:  [11-22] 20 (09/16 1506) BP: (119-140)/(62-116) 130/78 (09/17 0410) SpO2:  [92 %-98 %] 92 % (09/17 0410)  Intake/Output from previous day: 09/16 0701 - 09/17 0700 In: 1330 [P.O.:480; I.V.:800; IV Piggyback:50] Out: 120 [Drains:20; Blood:100] Intake/Output this shift: No intake/output data recorded.  No results for input(s): "HGB" in the last 72 hours. No results for input(s): "WBC", "RBC", "HCT", "PLT" in the last 72 hours. No results for input(s): "NA", "K", "CL", "CO2", "BUN", "CREATININE", "GLUCOSE", "CALCIUM" in the last 72 hours. No results for input(s): "LABPT", "INR" in the last 72 hours.  Neurologically intact DG Cervical Spine 2 or 3 views  Result Date: 04/08/2023 CLINICAL DATA:  Elective surgery. EXAM: CERVICAL SPINE - 2-3 VIEW COMPARISON:  None Available. FINDINGS: Five fluoroscopic spot views of the cervical spine obtained in the operating room. Sequential imaging obtained during anterior fusion C3 through C5 with interbody spacers. Fluoroscopy time 19.3 seconds. Dose 0.79 mGy. IMPRESSION: Intraoperative fluoroscopy during cervical spine surgery. Electronically Signed   By: Narda Rutherford M.D.   On: 04/08/2023 12:44   DG C-Arm 1-60 Min-No Report  Result Date: 04/08/2023 Fluoroscopy was utilized by the requesting physician.  No radiographic interpretation.   DG C-Arm 1-60 Min-No Report  Result Date: 04/08/2023 Fluoroscopy was utilized by the requesting physician.  No radiographic interpretation.    Assessment/Plan: 1 Day  Post-Op Procedure(s) (LRB): C3-4, C4-5 ANTERIOR CERVICAL DISCECTOMY FUSION, ALLOGRAFT, PLATE (N/A) Plan: discharge home. HV removed.   Eldred Manges 04/09/2023, 7:39 AM

## 2023-04-09 NOTE — Progress Notes (Signed)
PT Cancellation Note and Discharge  Patient Details Name: Alejandra Mcmahon MRN: [id number] DOB: [birth year]   Cancelled Treatment:    Reason Eval/Treat Not Completed: PT screened, no needs identified, will sign off. Discussed pt case with OT who reports pt is currently mobilizing without assistance and does not require a formal PT evaluation at this time. PT signing off. If needs change, please reconsult.     Marylynn Pearson 04/09/2023, 8:32 AM  Conni Slipper, PT, DPT Acute Rehabilitation Services Secure Chat Preferred Office: [phone]

## 2023-04-09 NOTE — Evaluation (Signed)
Occupational Therapy Evaluation Patient Details Name: Alejandra Mcmahon MRN: [id number] DOB: [birth year] Today's Date: 04/09/2023   History of Present Illness Pt is a 47 y/o F s/p C3-4, C4-5 ACDF, PMH includes anxiety, arthritis, GERD, depression   Clinical Impression   Pt reports ind at baseline with ADLs/functional mobility, will have family available for assist at d/c. Pt currently needing set up  - supervision for ADLs, and supervision for transfers without AD. Pt plans to sleep in recliner, but is able to verbalize log roll technique if needed. Pt educated on brace wear, compensatory strategies for ADLs, and cervical precautions, pt verbalized understanding. Pt presenting with impairments listed below, however has no acute OT needs at this time, will s/o. Please reconsult if there is a change in pt status. Anticipate no OT follow up needs at d/c.        If plan is discharge home, recommend the following: Assistance with cooking/housework;Assist for transportation;A little help with bathing/dressing/bathroom    Functional Status Assessment  Patient has had a recent decline in their functional status and demonstrates the ability to make significant improvements in function in a reasonable and predictable amount of time.  Equipment Recommendations  None recommended by OT (pt has all needed DME)    Recommendations for Other Services       Precautions / Restrictions Precautions Precautions: Cervical Precaution Booklet Issued: Yes (comment) Precaution Comments: educated on cervical prec Required Braces or Orthoses: Cervical Brace Cervical Brace: Soft collar;At all times Restrictions Weight Bearing Restrictions: No      Mobility Bed Mobility Overal bed mobility: Needs Assistance             General bed mobility comments: pivots EOB, plans to sleep in recliner at home, able to verbalize understanding of log roll technique, states she was doing that at home PTA     Transfers Overall transfer level: Needs assistance Equipment used: None Transfers: Sit to/from Stand Sit to Stand: Supervision                  Balance Overall balance assessment: Mild deficits observed, not formally tested                                         ADL either performed or assessed with clinical judgement   ADL Overall ADL's : Needs assistance/impaired Eating/Feeding: Set up;Sitting   Grooming: Set up;Sitting   Upper Body Bathing: Supervision/ safety   Lower Body Bathing: Supervison/ safety   Upper Body Dressing : Supervision/safety   Lower Body Dressing: Supervision/safety   Toilet Transfer: Supervision/safety   Toileting- Clothing Manipulation and Hygiene: Supervision/safety       Functional mobility during ADLs: Supervision/safety       Vision Baseline Vision/History: 1 Wears glasses Vision Assessment?: No apparent visual deficits     Perception Perception: Not tested       Praxis Praxis: Not tested       Pertinent Vitals/Pain Pain Assessment Pain Assessment: No/denies pain     Extremity/Trunk Assessment Upper Extremity Assessment Upper Extremity Assessment: Overall WFL for tasks assessed   Lower Extremity Assessment Lower Extremity Assessment: Overall WFL for tasks assessed   Cervical / Trunk Assessment Cervical / Trunk Assessment: Normal   Communication Communication Communication: No apparent difficulties   Cognition Arousal: Alert Behavior During Therapy: WFL for tasks assessed/performed Overall Cognitive Status: Within Functional Limits for tasks assessed  General Comments  none stated    Exercises     Shoulder Instructions      Home Living Family/patient expects to be discharged to:: Private residence Living Arrangements: Spouse/significant other;Children;Other relatives Available Help at Discharge: Family;Available 24 hours/day Type  of Home: House Home Access: Stairs to enter [employer] of Steps: 2   Home Layout: One level     Bathroom Shower/Tub: Producer, television/film/video: Standard     Home Equipment: Shower seat - built in          Prior Functioning/Environment Prior Level of Function : Independent/Modified Independent             Mobility Comments: no AD use ADLs Comments: ind, working, driving        OT Problem List: Decreased range of motion;Decreased activity tolerance      OT Treatment/Interventions:      OT Goals(Current goals can be found in the care plan section) Acute Rehab OT Goals Patient Stated Goal: none stated OT Goal Formulation: With patient Time For Goal Achievement: 04/23/23 Potential to Achieve Goals: Good  OT Frequency:      Co-evaluation              AM-PAC OT "6 Clicks" Daily Activity     Outcome Measure Help from another person eating meals?: None Help from another person taking care of personal grooming?: A Little Help from another person toileting, which includes using toliet, bedpan, or urinal?: A Little Help from another person bathing (including washing, rinsing, drying)?: A Little Help from another person to put on and taking off regular upper body clothing?: A Little Help from another person to put on and taking off regular lower body clothing?: A Little 6 Click Score: 19   End of Session Equipment Utilized During Treatment: Cervical collar Nurse Communication: Mobility status  Activity Tolerance: Patient tolerated treatment well Patient left: in bed;with call bell/phone within reach;with family/visitor present  OT Visit Diagnosis: Muscle weakness (generalized) (M62.81)                Time: 5409-8119 OT Time Calculation (min): 17 min Charges:  OT General Charges $OT Visit: 1 Visit OT Evaluation $OT Eval Low Complexity: 1 Low  Aasiya Creasey K, OTD, OTR/L SecureChat Preferred Acute Rehab [phone]   Alanii Ramer K  Koonce 04/09/2023, 8:34 AM

## 2023-04-09 NOTE — Plan of Care (Signed)
Pt doing well. Pt and husband given D/C instructions with verbal understanding. Rx's were sent to the pharmacy by MD. Pt's incision is clean and dry with no sign of infection. Dressing was changed per MD order. Pt's IV and Hemovac were removed prior to D/C. Pt D/C'd home via wheelchair per MD order. Pt is stable @ D/C and has no other needs at this time. Rema Fendt, RN

## 2023-04-09 NOTE — Anesthesia Postprocedure Evaluation (Signed)
Anesthesia Post Note  Patient: Alejandra Mcmahon  Procedure(s) Performed: C3-4, C4-5 ANTERIOR CERVICAL DISCECTOMY FUSION, ALLOGRAFT, PLATE (Neck)     Patient location during evaluation: PACU Anesthesia Type: General Level of consciousness: awake and alert Pain management: pain level controlled Vital Signs Assessment: post-procedure vital signs reviewed and stable Respiratory status: spontaneous breathing, nonlabored ventilation, respiratory function stable and patient connected to nasal cannula oxygen Cardiovascular status: blood pressure returned to baseline and stable Postop Assessment: no apparent nausea or vomiting Anesthetic complications: no   No notable events documented.  Last Vitals:  Vitals:   04/09/23 0410 04/09/23 0802  BP: 130/78 123/80  Pulse: (!) 104 (!) 102  Resp:  16  Temp: 36.9 C 36.9 C  SpO2: 92% 99%    Last Pain:  Vitals:   04/09/23 0802  TempSrc: Oral  PainSc:                  Janellie Tennison

## 2023-04-09 NOTE — Discharge Instructions (Signed)
Keep your collar on at all times.  Use extra collar with Saran wrap around the collar when you take a shower.  After you dry off reapply the dry collar.  He can remove the Saran wrap and let the shower collar dry off until next use.  Walk daily.  He will have less problems if you are in a beach chair or recliner type position.  He can resume sleeping flat in bed as the swelling goes down in your neck and your swallowing easily.  See Dr. Ophelia Charter in 1 week.  Your medications have been sent into the pharmacy pain medication and a muscle relaxant.

## 2023-04-11 ENCOUNTER — Ambulatory Visit: Payer: Commercial Managed Care - PPO | Admitting: Family

## 2023-04-15 ENCOUNTER — Other Ambulatory Visit: Payer: Self-pay | Admitting: Orthopaedic Surgery

## 2023-04-15 ENCOUNTER — Encounter: Payer: Self-pay | Admitting: Orthopaedic Surgery

## 2023-04-16 ENCOUNTER — Other Ambulatory Visit: Payer: Self-pay

## 2023-04-16 ENCOUNTER — Ambulatory Visit: Payer: Commercial Managed Care - PPO | Admitting: Family

## 2023-04-16 ENCOUNTER — Ambulatory Visit (INDEPENDENT_AMBULATORY_CARE_PROVIDER_SITE_OTHER): Payer: Commercial Managed Care - PPO | Admitting: Orthopaedic Surgery

## 2023-04-16 DIAGNOSIS — Z981 Arthrodesis status: Secondary | ICD-10-CM

## 2023-04-16 MED ORDER — TRAMADOL HCL 50 MG PO TABS
50.0000 mg | ORAL_TABLET | Freq: Two times a day (BID) | ORAL | 0 refills | Status: DC | PRN
Start: 2023-04-16 — End: 2023-06-12

## 2023-04-16 NOTE — Progress Notes (Signed)
Post-Op Visit Note   Patient: Alejandra Mcmahon           Date of Birth: Aug 29, 1975           MRN: [id number] Visit Date: 04/16/2023 PCP: Junie Spencer, FNP   Assessment & Plan: Follow-up two-level cervical fusion C3-4 C4-5.  She used up her Percocet has some rash across her trunk related to some sweating.  X-rays look good.  Will send in some tramadol she can take 1 at night if needed.  Recheck 5 weeks for flexion extension lateral C-spine x-ray out of collar.  Good relief of radicular symptoms.  Chief Complaint:  Chief Complaint  Patient presents with   Neck - Routine Post Op   Visit Diagnoses:  1. S/P cervical spinal fusion     Plan: Return in 5 weeks for x-rays as above.  Follow-Up Instructions: No follow-ups on file.   Orders:  Orders Placed This Encounter  Procedures   XR Cervical Spine 2 or 3 views   Meds ordered this encounter  Medications   traMADol (ULTRAM) 50 MG tablet    Sig: Take 1 tablet (50 mg total) by mouth every 12 (twelve) hours as needed.    Dispense:  20 tablet    Refill:  0    Imaging: No results found.  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 01/22/2023   Myelomalacia of cervical cord (HCC) 01/22/2023   Chronic bilateral low back pain without sciatica 10/15/2022   Hyperlipidemia 09/25/2021   Obesity (BMI 30-39.9) 05/27/2018   Encounter for surveillance of contraceptive pills 02/18/2017   GAD (generalized anxiety disorder) 05/09/2015   GERD (gastroesophageal reflux disease) 03/16/2013   Depression 03/16/2013   Allergic rhinitis 03/16/2013   Past Medical History:  Diagnosis Date   Allergy    Anxiety    Arthritis    Depression    GERD (gastroesophageal reflux disease)     Family History  Problem Relation Age of Onset   Cancer Mother        breast   Breast  cancer Mother     Past Surgical History:  Procedure Laterality Date   ANTERIOR CERVICAL DECOMP/DISCECTOMY FUSION N/A 04/08/2023   Procedure: C3-4, C4-5 ANTERIOR CERVICAL DISCECTOMY FUSION, ALLOGRAFT, PLATE;  Surgeon: Eldred Manges, MD;  Location: [facility] OR;  Service: Orthopedics;  Laterality: N/A;   CESAREAN SECTION     Social History   Occupational History   Not on file  Tobacco Use   Smoking status: Never   Smokeless tobacco: Never  Vaping Use   Vaping status: Never Used  Substance and Sexual Activity   Alcohol use: No   Drug use: No   Sexual activity: Not on file

## 2023-04-22 ENCOUNTER — Encounter: Payer: Self-pay | Admitting: Orthopaedic Surgery

## 2023-04-22 ENCOUNTER — Telehealth: Payer: Self-pay | Admitting: Orthopaedic Surgery

## 2023-04-22 NOTE — Telephone Encounter (Signed)
Pt called stating she needs you to give her a call please. Fenton Foy [phone]

## 2023-04-23 NOTE — Telephone Encounter (Signed)
See pt portal message  

## 2023-05-17 ENCOUNTER — Other Ambulatory Visit: Payer: Self-pay | Admitting: Family

## 2023-05-17 DIAGNOSIS — E785 Hyperlipidemia, unspecified: Secondary | ICD-10-CM

## 2023-05-21 ENCOUNTER — Ambulatory Visit (INDEPENDENT_AMBULATORY_CARE_PROVIDER_SITE_OTHER): Payer: Commercial Managed Care - PPO | Admitting: Orthopaedic Surgery

## 2023-05-21 ENCOUNTER — Encounter: Payer: Self-pay | Admitting: Orthopaedic Surgery

## 2023-05-21 ENCOUNTER — Other Ambulatory Visit (INDEPENDENT_AMBULATORY_CARE_PROVIDER_SITE_OTHER): Payer: Commercial Managed Care - PPO

## 2023-05-21 VITALS — BP 136/96 | Ht 64.0 in | Wt 185.0 lb

## 2023-05-21 DIAGNOSIS — Z981 Arthrodesis status: Secondary | ICD-10-CM
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
## 2023-05-22 ENCOUNTER — Other Ambulatory Visit: Payer: Self-pay | Admitting: Family

## 2023-05-22 DIAGNOSIS — F321 Major depressive disorder, single episode, moderate: Secondary | ICD-10-CM

## 2023-05-22 DIAGNOSIS — F411 Generalized anxiety disorder: Secondary | ICD-10-CM
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
## 2023-06-12 ENCOUNTER — Ambulatory Visit: Payer: Commercial Managed Care - PPO

## 2023-06-12 ENCOUNTER — Ambulatory Visit (INDEPENDENT_AMBULATORY_CARE_PROVIDER_SITE_OTHER): Payer: Commercial Managed Care - PPO

## 2023-06-12 ENCOUNTER — Ambulatory Visit: Payer: Commercial Managed Care - PPO | Attending: Rheumatology | Admitting: Rheumatology

## 2023-06-12 ENCOUNTER — Encounter: Payer: Self-pay | Admitting: Rheumatology

## 2023-06-12 VITALS — BP 127/84 | HR 105 | Resp 15 | Ht 64.0 in | Wt 192.0 lb

## 2023-06-12 DIAGNOSIS — M79672 Pain in left foot: Secondary | ICD-10-CM

## 2023-06-12 DIAGNOSIS — H04123 Dry eye syndrome of bilateral lacrimal glands: Secondary | ICD-10-CM

## 2023-06-12 DIAGNOSIS — M25561 Pain in right knee: Secondary | ICD-10-CM | POA: Diagnosis not present

## 2023-06-12 DIAGNOSIS — M79642 Pain in left hand: Secondary | ICD-10-CM | POA: Diagnosis not present

## 2023-06-12 DIAGNOSIS — M79641 Pain in right hand: Secondary | ICD-10-CM

## 2023-06-12 DIAGNOSIS — J3089 Other allergic rhinitis: Secondary | ICD-10-CM

## 2023-06-12 DIAGNOSIS — G8929 Other chronic pain: Secondary | ICD-10-CM

## 2023-06-12 DIAGNOSIS — R682 Dry mouth, unspecified: Secondary | ICD-10-CM | POA: Diagnosis not present

## 2023-06-12 DIAGNOSIS — M791 Myalgia, unspecified site: Secondary | ICD-10-CM

## 2023-06-12 DIAGNOSIS — M545 Low back pain, unspecified: Secondary | ICD-10-CM

## 2023-06-12 DIAGNOSIS — F32A Depression, unspecified: Secondary | ICD-10-CM

## 2023-06-12 DIAGNOSIS — M25562 Pain in left knee: Secondary | ICD-10-CM

## 2023-06-12 DIAGNOSIS — E669 Obesity, unspecified: Secondary | ICD-10-CM

## 2023-06-12 DIAGNOSIS — F419 Anxiety disorder, unspecified: Secondary | ICD-10-CM

## 2023-06-12 DIAGNOSIS — K9041 Non-celiac gluten sensitivity: Secondary | ICD-10-CM

## 2023-06-12 DIAGNOSIS — M79671 Pain in right foot: Secondary | ICD-10-CM

## 2023-06-12 DIAGNOSIS — E782 Mixed hyperlipidemia: Secondary | ICD-10-CM

## 2023-06-12 DIAGNOSIS — Z981 Arthrodesis status: Secondary | ICD-10-CM

## 2023-06-12 DIAGNOSIS — K219 Gastro-esophageal reflux disease without esophagitis: Secondary | ICD-10-CM

## 2023-06-12 DIAGNOSIS — R5383 Other fatigue: Secondary | ICD-10-CM

## 2023-06-12 MED ORDER — PILOCARPINE HCL 5 MG PO TABS: 5.0000 mg | ORAL_TABLET | Freq: Two times a day (BID) | ORAL | 1 refills | Status: DC

## 2023-06-12 NOTE — Patient Instructions (Signed)
Pilocarpine Tablets What is this medication? PILOCARPINE (PYE loe KAR peen) treats dry mouth. It works by increasing the amount of saliva in the mouth, which makes it easier to speak and swallow. This medicine may be used for other purposes; ask your health care provider or pharmacist if you have questions. COMMON BRAND NAME(S): Salagen What should I tell my care team before I take this medication? They need to know if you have any of these conditions: Eye infection or other eye problems Glaucoma Heart disease Liver disease Lung or breathing disease, such as asthma An unusual or allergic reaction to pilocarpine, other medications, foods, dyes, or preservatives Pregnant or trying to get pregnant Breast-feeding How should I use this medication? Take this medication by mouth with a full glass of water. Take it as directed on the prescription label at the same time every day. Keep taking it unless your care team tells you to stop. Talk to your care team about the use of this medication in children. Special care may be needed. Overdosage: If you think you have taken too much of this medicine contact a poison control center or emergency room at once. NOTE: This medicine is only for you. Do not share this medicine with others. What if I miss a dose? If you miss a dose, take it as soon as you can. If it is almost time for your next dose, take only that dose. Do not take double or extra doses. What may interact with this medication? Antihistamines for allergy, cough, and cold Atropine Certain medications for Alzheimer disease, such as donepezil, galantamine, rivastigmine Certain medications for bladder problems, such as bethanechol, oxybutynin, tolterodine Certain medications for Parkinson disease, such as benztropine, trihexyphenidyl Certain medications for quitting smoking, such as nicotine Certain medications for stomach problems, such as dicyclomine, hyoscyamine Certain medications for  travel sickness, such as scopolamine Ipratropium Medications for blood pressure or heart problems, such as metoprolol This list may not describe all possible interactions. Give your health care provider a list of all the medicines, herbs, non-prescription drugs, or dietary supplements you use. Also tell them if you smoke, drink alcohol, or use illegal drugs. Some items may interact with your medicine. What should I watch for while using this medication? Visit your care team for regular checks on your progress. Tell your care team if your symptoms do not get better or if they get worse. You may get blurry vision or have trouble telling how far something is from you. This may be a problem at night or when the lights are low. Do not drive, use machinery, or do anything that needs clear vision until you know how this medication affects you. If you sweat a lot, drink enough to replace fluids. Do not get dehydrated. What side effects may I notice from receiving this medication? Side effects that you should report to your care team as soon as possible: Allergic reactions--skin rash, itching, hives, swelling of the face, lips, tongue, or throat Fast or irregular heartbeat Increase in blood pressure Low blood pressure--dizziness, feeling faint or lightheaded, blurry vision Slow heartbeat--dizziness, feeling faint or lightheaded, confusion, trouble breathing, unusual weakness or fatigue Side effects that usually do not require medical attention (report to your care team if they continue or are bothersome): Change in vision Chills Diarrhea Excessive sweating Flushing Headache Increased need to urinate This list may not describe all possible side effects. Call your doctor for medical advice about side effects. You may report side effects to FDA at 1-800-FDA-1088.   Where should I keep my medication? Keep out of the reach of children and pets. Store at room temperature between 15 and 30 degrees C (59 and  86 degrees F). Get rid of any unused medication after the expiration date. To get rid of medications that are no longer needed or have expired: Take the medications to a medication take-back program. Check with your pharmacy or law enforcement to find a location. If your cannot return the medication, check the label or package insert to see if the medication should be thrown out in the garbage or flushed down the toilet. If you are not sure, ask your care team. If it is safe to put it in the trash, take the medication out of the container. Mix the medication with cat litter, dirt, coffee grounds, or other unwanted substance. Seal the mixture in a bag or container. Put it in the trash. NOTE: This sheet is a summary. It may not cover all possible information. If you have questions about this medicine, talk to your doctor, pharmacist, or health care provider.  2024 Elsevier/Gold Standard (2021-06-23 00:00:00)  

## 2023-06-16 LAB — ANTI-SCLERODERMA ANTIBODY: Scleroderma (Scl-70) (ENA) Antibody, IgG: <1 AI

## 2023-06-16 LAB — CYCLIC CITRUL PEPTIDE ANTIBODY, IGG: Cyclic Citrullin Peptide Ab: <16 U

## 2023-06-16 LAB — CK: Total CK: 86 U/L (ref 29–143)

## 2023-06-16 LAB — PROTEIN ELECTROPHORESIS, SERUM, WITH REFLEX
Albumin ELP: 3.8 g/dL (ref 3.8–4.8)
Alpha 1: 0.4 g/dL — ABNORMAL HIGH (ref 0.2–0.3)
Alpha 2: 0.8 g/dL (ref 0.5–0.9)
Beta 2: 0.4 g/dL (ref 0.2–0.5)
Beta Globulin: 0.5 g/dL (ref 0.4–0.6)
Gamma Globulin: 0.8 g/dL (ref 0.8–1.7)
Total Protein: 6.6 g/dL (ref 6.1–8.1)

## 2023-06-16 LAB — C3 AND C4
C3 Complement: 182 mg/dL (ref 83–193)
C4 Complement: 34 mg/dL (ref 15–57)

## 2023-06-16 LAB — ANTI-NUCLEAR AB-TITER (ANA TITER)
ANA TITER: 1:160 {titer} — ABNORMAL HIGH
ANA Titer 1: 1:160 {titer} — ABNORMAL HIGH

## 2023-06-16 LAB — ANA: Anti Nuclear Antibody (ANA): POSITIVE — AB

## 2023-06-16 LAB — GLUCOSE 6 PHOSPHATE DEHYDROGENASE: G-6PDH: 14.9 U/g{Hb} (ref 7.0–20.5)

## 2023-06-16 LAB — RNP ANTIBODY: Ribonucleic Protein(ENA) Antibody, IgG: <1 AI

## 2023-06-16 LAB — ANTI-SMITH ANTIBODY: ENA SM Ab Ser-aCnc: <1 AI

## 2023-06-16 LAB — ANTI-DNA ANTIBODY, DOUBLE-STRANDED: ds DNA Ab: <1 [IU]/mL

## 2023-06-16 LAB — SEDIMENTATION RATE: Sed Rate: 11 mm/h (ref 0–20)

## 2023-06-16 LAB — RHEUMATOID FACTOR: Rheumatoid fact SerPl-aCnc: 10 [IU]/mL (ref <14)

## 2023-06-16 LAB — SJOGRENS SYNDROME-B EXTRACTABLE NUCLEAR ANTIBODY: SSB (La) (ENA) Antibody, IgG: <1 AI

## 2023-06-16 LAB — SJOGRENS SYNDROME-A EXTRACTABLE NUCLEAR ANTIBODY: SSA (Ro) (ENA) Antibody, IgG: <1 AI

## 2023-06-16 NOTE — Progress Notes (Signed)
ANA is low titer positive.  All other labs are within normal limits.  I will discuss results at the follow-up visit.

## 2023-06-25 ENCOUNTER — Encounter: Payer: Self-pay | Admitting: Orthopaedic Surgery

## 2023-06-25 NOTE — Telephone Encounter (Signed)
noted 
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
## 2023-07-11 ENCOUNTER — Encounter: Payer: Self-pay | Admitting: Rheumatology

## 2023-07-11 ENCOUNTER — Ambulatory Visit: Payer: Commercial Managed Care - PPO | Attending: Rheumatology | Admitting: Rheumatology

## 2023-07-11 VITALS — BP 129/89 | HR 99 | Resp 14 | Ht 64.0 in | Wt 187.0 lb

## 2023-07-11 DIAGNOSIS — M79671 Pain in right foot: Secondary | ICD-10-CM | POA: Diagnosis not present

## 2023-07-11 DIAGNOSIS — M79672 Pain in left foot: Secondary | ICD-10-CM

## 2023-07-11 DIAGNOSIS — M25561 Pain in right knee: Secondary | ICD-10-CM

## 2023-07-11 DIAGNOSIS — Z981 Arthrodesis status: Secondary | ICD-10-CM

## 2023-07-11 DIAGNOSIS — F32A Depression, unspecified: Secondary | ICD-10-CM

## 2023-07-11 DIAGNOSIS — F419 Anxiety disorder, unspecified: Secondary | ICD-10-CM

## 2023-07-11 DIAGNOSIS — M25562 Pain in left knee: Secondary | ICD-10-CM

## 2023-07-11 DIAGNOSIS — R5383 Other fatigue: Secondary | ICD-10-CM

## 2023-07-11 DIAGNOSIS — M3500 Sicca syndrome, unspecified: Secondary | ICD-10-CM

## 2023-07-11 DIAGNOSIS — M791 Myalgia, unspecified site: Secondary | ICD-10-CM

## 2023-07-11 DIAGNOSIS — M545 Low back pain, unspecified: Secondary | ICD-10-CM

## 2023-07-11 DIAGNOSIS — M79642 Pain in left hand: Secondary | ICD-10-CM

## 2023-07-11 DIAGNOSIS — K9041 Non-celiac gluten sensitivity: Secondary | ICD-10-CM

## 2023-07-11 DIAGNOSIS — J3089 Other allergic rhinitis: Secondary | ICD-10-CM

## 2023-07-11 DIAGNOSIS — E782 Mixed hyperlipidemia: Secondary | ICD-10-CM

## 2023-07-11 DIAGNOSIS — E669 Obesity, unspecified: Secondary | ICD-10-CM

## 2023-07-11 DIAGNOSIS — M79641 Pain in right hand: Secondary | ICD-10-CM | POA: Diagnosis not present

## 2023-07-11 DIAGNOSIS — K219 Gastro-esophageal reflux disease without esophagitis: Secondary | ICD-10-CM

## 2023-07-11 DIAGNOSIS — G8929 Other chronic pain: Secondary | ICD-10-CM

## 2023-07-11 MED ORDER — PILOCARPINE HCL 5 MG PO TABS
5.0000 mg | ORAL_TABLET | Freq: Three times a day (TID) | ORAL | 0 refills | Status: DC | PRN
Start: 2023-07-11 — End: 2023-08-01

## 2023-07-11 NOTE — Patient Instructions (Addendum)
Osteoarthritis  Osteoarthritis is a type of arthritis. It refers to joint pain or joint disease. Osteoarthritis affects tissue that covers the ends of bones in joints (cartilage). Cartilage acts as a cushion between the bones and helps them move smoothly. Osteoarthritis occurs when cartilage in the joints gets worn down. Osteoarthritis is sometimes called "wear and tear" arthritis. Osteoarthritis is the most common form of arthritis. It often occurs in older people. It is a condition that gets worse over time. The joints most often affected by this condition are in the fingers, toes, hips, knees, and spine, including the neck and lower back. What are the causes? This condition is caused by the wearing down of cartilage that covers the ends of bones. What increases the risk? The following factors may make you more likely to develop this condition: Being age 79 or older. Obesity. Overuse of joints. Past injury of a joint. Past surgery on a joint. Family history of osteoarthritis. What are the signs or symptoms? The main symptoms of this condition are pain, swelling, and stiffness in the joint. Other symptoms may include: An enlarged joint. More pain and further damage caused by small pieces of bone or cartilage that break off and float inside of the joint. Small deposits of bone (osteophytes) that grow on the edges of the joint. A grating or scraping feeling inside the joint when you move it. Popping or creaking sounds when you move. Difficulty walking or exercising. An inability to grip items, twist your hand, or control the movements of your hands and fingers. How is this diagnosed? This condition may be diagnosed based on: Your medical history. A physical exam. Your symptoms. X-rays of the affected joints. Blood tests to rule out other types of arthritis. How is this treated? There is no cure for this condition, but treatment can help control pain and improve joint function. Treatment  may include a combination of therapies, such as: Pain relief techniques, such as: Applying heat and cold to the joint. Massage. A form of talk therapy called cognitive behavioral therapy (CBT). This therapy helps you set goals and follow up on the changes that you make. Medicines for pain and inflammation. The medicines can be taken by mouth or applied to the skin. They include: NSAIDs, such as ibuprofen. Prescription medicines. Strong anti-inflammatory medicines (corticosteroids). Certain nutritional supplements. A prescribed exercise program. You may work with a physical therapist. Assistive devices, such as a brace, wrap, splint, specialized glove, or cane. A weight control plan. Surgery, such as: An osteotomy. This is done to reposition the bones and relieve pain or to remove loose pieces of bone and cartilage. Joint replacement surgery. You may need this surgery if you have advanced osteoarthritis. Follow these instructions at home: Activity Rest your affected joints as told by your health care provider. Exercise as told by your provider. The provider may recommend specific types of exercise, such as: Strengthening exercises. These are done to strengthen the muscles that support joints affected by arthritis. Aerobic activities. These are exercises, such as brisk walking or water aerobics, that increase your heart rate. Range-of-motion activities. These help your joints move more easily. Balance and agility exercises. Managing pain, stiffness, and swelling     If told, apply heat to the affected area as often as told by your provider. Use the heat source that your provider recommends, such as a moist heat pack or a heating pad. If you have a removable assistive device, remove it as told by your provider. Place a  towel between your skin and the heat source. If your provider tells you to keep the assistive device on while you apply heat, place a towel between the assistive device and  the heat source. Leave the heat on for 20-30 minutes. If told, put ice on the affected area. If you have a removable assistive device, remove it as told by your provider. Put ice in a plastic bag. Place a towel between your skin and the bag. If your provider tells you to keep the assistive device on during icing, place a towel between the assistive device and the bag. Leave the ice on for 20 minutes, 2-3 times a day. If your skin turns bright red, remove the ice or heat right away to prevent skin damage. The risk of damage is higher if you cannot feel pain, heat, or cold. Move your fingers or toes often to reduce stiffness and swelling. Raise (elevate) the affected area above the level of your heart while you are sitting or lying down. General instructions Take over-the-counter and prescription medicines only as told by your provider. Maintain a healthy weight. Follow instructions from your provider for weight control. Do not use any products that contain nicotine or tobacco. These products include cigarettes, chewing tobacco, and vaping devices, such as e-cigarettes. If you need help quitting, ask your provider. Use assistive devices as told by your provider. Where to find more information [employer]th[employer] and Musculoskeletal [employer] Skin Diseases: niams.http://www.myers.net/ [employer] on Aging: BaseRingTones.pl American College of Rheumatology: rheumatology.org Contact a health care provider if: You have redness, swelling, or a feeling of warmth in a joint that gets worse. You have a fever along with joint or muscle aches. You develop a rash. You have trouble doing your normal activities. You have pain that gets worse and is not relieved by pain medicine. This information is not intended to replace advice given to you by your health care provider. Make sure you discuss any questions you have with your health care provider. Document Revised: 03/08/2022 Document Reviewed:  03/08/2022 [employer] Patient Education  2024 [employer]  Hand Exercises Hand exercises can be helpful for almost anyone. They can strengthen your hands and improve flexibility and movement. The exercises can also increase blood flow to the hands. These results can make your work and daily tasks easier for you. Hand exercises can be especially helpful for people who have joint pain from arthritis or nerve damage from using their hands over and over. These exercises can also help people who injure a hand. Exercises Most of these hand exercises are gentle stretching and motion exercises. It is usually safe to do them often throughout the day. Warming up your hands before exercise may help reduce stiffness. You can do this with gentle massage or by placing your hands in warm water for 10-15 minutes. It is normal to feel some stretching, pulling, tightness, or mild discomfort when you begin new exercises. In time, this will improve. Remember to always be careful and stop right away if you feel sudden, very bad pain or your pain gets worse. You want to get better and be safe. Ask your health care provider which exercises are safe for you. Do exercises exactly as told by your provider and adjust them as told. Do not begin these exercises until told by your provider. Knuckle bend or "claw" fist  Stand or sit with your arm, hand, and all five fingers pointed straight up. Make sure to keep your wrist straight. Gently  bend your fingers down toward your palm until the tips of your fingers are touching your palm. Keep your big knuckle straight and only bend the small knuckles in your fingers. Hold this position for 10 seconds. Straighten your fingers back to your starting position. Repeat this exercise 5-10 times with each hand. Full finger fist  Stand or sit with your arm, hand, and all five fingers pointed straight up. Make sure to keep your wrist straight. Gently bend your fingers into your palm until  the tips of your fingers are touching the middle of your palm. Hold this position for 10 seconds. Extend your fingers back to your starting position, stretching every joint fully. Repeat this exercise 5-10 times with each hand. Straight fist  Stand or sit with your arm, hand, and all five fingers pointed straight up. Make sure to keep your wrist straight. Gently bend your fingers at the big knuckle, where your fingers meet your hand, and at the middle knuckle. Keep the knuckle at the tips of your fingers straight and try to touch the bottom of your palm. Hold this position for 10 seconds. Extend your fingers back to your starting position, stretching every joint fully. Repeat this exercise 5-10 times with each hand. Tabletop  Stand or sit with your arm, hand, and all five fingers pointed straight up. Make sure to keep your wrist straight. Gently bend your fingers at the big knuckle, where your fingers meet your hand, as far down as you can. Keep the small knuckles in your fingers straight. Think of forming a tabletop with your fingers. Hold this position for 10 seconds. Extend your fingers back to your starting position, stretching every joint fully. Repeat this exercise 5-10 times with each hand. Finger spread  Place your hand flat on a table with your palm facing down. Make sure your wrist stays straight. Spread your fingers and thumb apart from each other as far as you can until you feel a gentle stretch. Hold this position for 10 seconds. Bring your fingers and thumb tight together again. Hold this position for 10 seconds. Repeat this exercise 5-10 times with each hand. Making circles  Stand or sit with your arm, hand, and all five fingers pointed straight up. Make sure to keep your wrist straight. Make a circle by touching the tip of your thumb to the tip of your index finger. Hold for 10 seconds. Then open your hand wide. Repeat this motion with your thumb and each of your  fingers. Repeat this exercise 5-10 times with each hand. Thumb motion  Sit with your forearm resting on a table and your wrist straight. Your thumb should be facing up toward the ceiling. Keep your fingers relaxed as you move your thumb. Lift your thumb up as high as you can toward the ceiling. Hold for 10 seconds. Bend your thumb across your palm as far as you can, reaching the tip of your thumb for the small finger (pinkie) side of your palm. Hold for 10 seconds. Repeat this exercise 5-10 times with each hand. Grip strengthening  Hold a stress ball or other soft ball in the middle of your hand. Slowly increase the pressure, squeezing the ball as much as you can without causing pain. Think of bringing the tips of your fingers into the middle of your palm. All of your finger joints should bend when doing this exercise. Hold your squeeze for 10 seconds, then relax. Repeat this exercise 5-10 times with each hand. Contact a health  care provider if: Your hand pain or discomfort gets much worse when you do an exercise. Your hand pain or discomfort does not improve within 2 hours after you exercise. If you have either of these problems, stop doing these exercises right away. Do not do them again unless your provider says that you can. Get help right away if: You develop sudden, severe hand pain or swelling. If this happens, stop doing these exercises right away. Do not do them again unless your provider says that you can. This information is not intended to replace advice given to you by your health care provider. Make sure you discuss any questions you have with your health care provider. Document Revised: 07/24/2022 Document Reviewed: 07/24/2022 [employer] Patient Education  2024 [employer] Exercises for Chronic Knee Pain Chronic knee pain is pain that lasts longer than 3 months. For most people with chronic knee pain, exercise and weight loss is an important part of treatment. Your health care  provider may want you to focus on: Making the muscles that support your knee stronger. This can take pressure off your knee and reduce pain. Preventing knee stiffness. How far you can move your knee, keeping it there or making it farther. Losing weight (if this applies) to take pressure off your knee, lower your risk for injury, and make it easier for you to exercise. Your provider will help you make an exercise program that fits your needs and physical abilities. Below are simple, low-impact exercises you can do at home. Ask your provider or physical therapist how often you should do your exercise program and how many times to repeat each exercise. General safety tips  Get your provider's approval before doing any exercises. Start slowly and stop any time you feel pain. Do not exercise if your knee pain is flaring up. Warm up first. Stretching a cold muscle can cause an injury. Do 5-10 minutes of easy movement or light stretching before beginning your exercises. Do 5-10 minutes of low-impact activity (like walking or cycling) before starting strengthening exercises. Contact your provider any time you have pain during or after exercising. Exercise can cause discomfort but should not be painful. It is normal to be a little stiff or sore after exercising. Stretching and range-of-motion exercises Front thigh stretch  Stand up straight and support your body by holding on to a chair or resting one hand on a wall. With your legs straight and close together, bend one knee to lift your heel up toward your butt. Using one hand for support, grab your ankle with your free hand. Pull your foot up closer toward your butt to feel the stretch in front of your thigh. Hold the stretch for 30 seconds. Repeat __________ times. Complete this exercise __________ times a day. Back thigh stretch  Sit on the floor with your back straight and your legs out straight in front of you. Place the palms of your hands on  the floor and slide them toward your feet as you bend at the hip. Try to touch your nose to your knees and feel the stretch in the back of your thighs. Hold for 30 seconds. Repeat __________ times. Complete this exercise __________ times a day. Calf stretch  Stand facing a wall. Place the palms of your hands flat against the wall, arms extended, and lean slightly against the wall. Get into a lunge position with one leg bent at the knee and the other leg stretched out straight behind you. Keep both feet facing  the wall and increase the bend in your knee while keeping the heel of the other leg flat on the ground. You should feel the stretch in your calf. Hold for 30 seconds. Repeat __________ times. Complete this exercise __________ times a day. Strengthening exercises Straight leg lift  Lie on your back with one knee bent and the other leg out straight. Slowly lift the straight leg without bending the knee. Lift until your foot is about 12 inches (30 cm) off the floor. Hold for 3-5 seconds and slowly lower your leg. Repeat __________ times. Complete this exercise __________ times a day. Single leg dip  Stand between two chairs and put both hands on the backs of the chairs for support. Extend one leg out straight with your body weight resting on the heel of the standing leg. Slowly bend your standing knee to dip your body to the level that is comfortable for you. Hold for 3-5 seconds. Repeat __________ times. Complete this exercise __________ times a day. Hamstring curls  Stand straight, knees close together, facing the back of a chair. Hold on to the back of a chair with both hands. Keep one leg straight. Bend the other knee while bringing the heel up toward the butt until the knee is bent at a 90-degree angle (right angle). Hold for 3-5 seconds. Repeat __________ times. Complete this exercise __________ times a day. Wall squat  Stand straight with your back, hips, and head against  a wall. Step forward one foot at a time with your back still against the wall. Your feet should be 2 feet (61 cm) from the wall at shoulder width. Keeping your back, hips, and head against the wall, slide down the wall to as close to a sitting position as you can get. Hold for 5-10 seconds, then slowly slide back up. Repeat __________ times. Complete this exercise __________ times a day. Step-ups  Stand in front of a sturdy platform or stool that is about 6 inches (15 cm) high. Slowly step up with your left / right foot, keeping your knee in line with your hip and foot. Do not let your knee bend so far that you cannot see your toes. Hold on to a chair for balance, but do not use it for support. Slowly unlock your knee and lower yourself to the starting position. Repeat __________ times. Complete this exercise __________ times a day. Contact a health care provider if: Your exercises cause pain. Your pain is worse after you exercise. Your pain prevents you from doing your exercises. This information is not intended to replace advice given to you by your health care provider. Make sure you discuss any questions you have with your health care provider. Document Revised: 07/24/2022 Document Reviewed: 07/24/2022 Else[employer] Patient Education  2024 [employer]  Low Back Sprain or Strain Rehab Ask your health care provider which exercises are safe for you. Do exercises exactly as told by your health care provider and adjust them as directed. It is normal to feel mild stretching, pulling, tightness, or discomfort as you do these exercises. Stop right away if you feel sudden pain or your pain gets worse. Do not begin these exercises until told by your health care provider. Stretching and range-of-motion exercises These exercises warm up your muscles and joints and improve the movement and flexibility of your back. These exercises also help to relieve pain, numbness, and tingling. Lumbar rotation  Lie  on your back on a firm bed or the floor with your  knees bent. Straighten your arms out to your sides so each arm forms a 90-degree angle (right angle) with a side of your body. Slowly move (rotate) both of your knees to one side of your body until you feel a stretch in your lower back (lumbar). Try not to let your shoulders lift off the floor. Hold this position for __________ seconds. Tense your abdominal muscles and slowly move your knees back to the starting position. Repeat this exercise on the other side of your body. Repeat __________ times. Complete this exercise __________ times a day. Single knee to chest  Lie on your back on a firm bed or the floor with both legs straight. Bend one of your knees. Use your hands to move your knee up toward your chest until you feel a gentle stretch in your lower back and buttock. Hold your leg in this position by holding on to the front of your knee. Keep your other leg as straight as possible. Hold this position for __________ seconds. Slowly return to the starting position. Repeat with your other leg. Repeat __________ times. Complete this exercise __________ times a day. Prone extension on elbows  Lie on your abdomen on a firm bed or the floor (prone position). Prop yourself up on your elbows. Use your arms to help lift your chest up until you feel a gentle stretch in your abdomen and your lower back. This will place some of your body weight on your elbows. If this is uncomfortable, try stacking pillows under your chest. Your hips should stay down, against the surface that you are lying on. Keep your hip and back muscles relaxed. Hold this position for __________ seconds. Slowly relax your upper body and return to the starting position. Repeat __________ times. Complete this exercise __________ times a day. Strengthening exercises These exercises build strength and endurance in your back. Endurance is the ability to use your muscles for a long  time, even after they get tired. Pelvic tilt This exercise strengthens the muscles that lie deep in the abdomen. Lie on your back on a firm bed or the floor with your legs extended. Bend your knees so they are pointing toward the ceiling and your feet are flat on the floor. Tighten your lower abdominal muscles to press your lower back against the floor. This motion will tilt your pelvis so your tailbone points up toward the ceiling instead of pointing to your feet or the floor. To help with this exercise, you may place a small towel under your lower back and try to push your back into the towel. Hold this position for __________ seconds. Let your muscles relax completely before you repeat this exercise. Repeat __________ times. Complete this exercise __________ times a day. Alternating arm and leg raises  Get on your hands and knees on a firm surface. If you are on a hard floor, you may want to use padding, such as an exercise mat, to cushion your knees. Line up your arms and legs. Your hands should be directly below your shoulders, and your knees should be directly below your hips. Lift your left leg behind you. At the same time, raise your right arm and straighten it in front of you. Do not lift your leg higher than your hip. Do not lift your arm higher than your shoulder. Keep your abdominal and back muscles tight. Keep your hips facing the ground. Do not arch your back. Keep your balance carefully, and do not hold your breath. Hold this  position for __________ seconds. Slowly return to the starting position. Repeat with your right leg and your left arm. Repeat __________ times. Complete this exercise __________ times a day. Abdominal set with straight leg raise  Lie on your back on a firm bed or the floor. Bend one of your knees and keep your other leg straight. Tense your abdominal muscles and lift your straight leg up, 4-6 inches (10-15 cm) off the ground. Keep your abdominal  muscles tight and hold this position for __________ seconds. Do not hold your breath. Do not arch your back. Keep it flat against the ground. Keep your abdominal muscles tense as you slowly lower your leg back to the starting position. Repeat with your other leg. Repeat __________ times. Complete this exercise __________ times a day. Single leg lower with bent knees Lie on your back on a firm bed or the floor. Tense your abdominal muscles and lift your feet off the floor, one foot at a time, so your knees and hips are bent in 90-degree angles (right angles). Your knees should be over your hips and your lower legs should be parallel to the floor. Keeping your abdominal muscles tense and your knee bent, slowly lower one of your legs so your toe touches the ground. Lift your leg back up to return to the starting position. Do not hold your breath. Do not let your back arch. Keep your back flat against the ground. Repeat with your other leg. Repeat __________ times. Complete this exercise __________ times a day. Posture and body mechanics Good posture and healthy body mechanics can help to relieve stress in your body's tissues and joints. Body mechanics refers to the movements and positions of your body while you do your daily activities. Posture is part of body mechanics. Good posture means: Your spine is in its natural S-curve position (neutral). Your shoulders are pulled back slightly. Your head is not tipped forward (neutral). Follow these guidelines to improve your posture and body mechanics in your everyday activities. Standing  When standing, keep your spine neutral and your feet about hip-width apart. Keep a slight bend in your knees. Your ears, shoulders, and hips should line up. When you do a task in which you stand in one place for a long time, place one foot up on a stable object that is 2-4 inches (5-10 cm) high, such as a footstool. This helps keep your spine  neutral. Sitting  When sitting, keep your spine neutral and keep your feet flat on the floor. Use a footrest, if necessary, and keep your thighs parallel to the floor. Avoid rounding your shoulders, and avoid tilting your head forward. When working at a desk or a computer, keep your desk at a height where your hands are slightly lower than your elbows. Slide your chair under your desk so you are close enough to maintain good posture. When working at a computer, place your monitor at a height where you are looking straight ahead and you do not have to tilt your head forward or downward to look at the screen. Resting When lying down and resting, avoid positions that are most painful for you. If you have pain with activities such as sitting, bending, stooping, or squatting, lie in a position in which your body does not bend very much. For example, avoid curling up on your side with your arms and knees near your chest (fetal position). If you have pain with activities such as standing for a long time or reaching  with your arms, lie with your spine in a neutral position and bend your knees slightly. Try the following positions: Lying on your side with a pillow between your knees. Lying on your back with a pillow under your knees. Lifting  When lifting objects, keep your feet at least shoulder-width apart and tighten your abdominal muscles. Bend your knees and hips and keep your spine neutral. It is important to lift using the strength of your legs, not your back. Do not lock your knees straight out. Always ask for help to lift heavy or awkward objects. This information is not intended to replace advice given to you by your health care provider. Make sure you discuss any questions you have with your health care provider. Document Revised: 11/12/2022 Document Reviewed: 09/26/2020 Elsevier Patient Education  2024 [employer].

## 2023-07-15 ENCOUNTER — Other Ambulatory Visit: Payer: Self-pay | Admitting: *Deleted

## 2023-07-15 ENCOUNTER — Encounter: Payer: Self-pay | Admitting: Rheumatology

## 2023-07-15 DIAGNOSIS — M3500 Sicca syndrome, unspecified: Secondary | ICD-10-CM

## 2023-07-15 NOTE — Telephone Encounter (Signed)
Per Dr. Renette Butters, CT Maxofacial w/contrast to include all parotid glands.

## 2023-07-15 NOTE — Telephone Encounter (Signed)
We can order CT scan of the parotid gland to rule out Sjogren's not sure if it will be covered by the insurance.  Other option will be that we can refer her to [facility] rheumatology Sjogren's clinic.

## 2023-07-31 ENCOUNTER — Other Ambulatory Visit: Payer: Self-pay | Admitting: Family

## 2023-07-31 DIAGNOSIS — F321 Major depressive disorder, single episode, moderate: Secondary | ICD-10-CM

## 2023-07-31 DIAGNOSIS — F411 Generalized anxiety disorder: Secondary | ICD-10-CM

## 2023-07-31 NOTE — Telephone Encounter (Signed)
[facility] NTBS in Feb for 6 mos FU RF sent to pharmacy

## 2023-07-31 NOTE — Telephone Encounter (Signed)
 Left message for pt to call back to make appt

## 2023-08-01 ENCOUNTER — Other Ambulatory Visit: Payer: Self-pay | Admitting: *Deleted

## 2023-08-01 DIAGNOSIS — M3500 Sicca syndrome, unspecified: Secondary | ICD-10-CM

## 2023-08-01 NOTE — Telephone Encounter (Signed)
 Last Fill: 07/11/2023  Next Visit: 01/13/2024  Last Visit: 07/11/2023  Dx: Sicca syndrome   Current Dose per office note on 07/11/2023:  pilocarpine  (SALAGEN ) 5 MG tablet       Sig: Take 1 tablet (5 mg total) by mouth 3 (three) times daily as needed.      Dispense:  90 tablet      Refill:  0    Okay to refill Pilocarpine ?

## 2023-08-02 MED ORDER — PILOCARPINE HCL 5 MG PO TABS
5.0000 mg | ORAL_TABLET | Freq: Three times a day (TID) | ORAL | 3 refills | Status: DC | PRN
Start: 2023-08-02 — End: 2023-11-06

## 2023-08-19 ENCOUNTER — Ambulatory Visit (HOSPITAL_COMMUNITY)
Admission: RE | Admit: 2023-08-19 | Discharge: 2023-08-19 | Disposition: A | Discharge status: Home / Self Care | Payer: Commercial Managed Care - PPO | Source: Ambulatory Visit | Attending: Rheumatology | Admitting: Rheumatology

## 2023-08-19 DIAGNOSIS — M3500 Sicca syndrome, unspecified: Secondary | ICD-10-CM | POA: Insufficient documentation

## 2023-08-19 MED ORDER — IOHEXOL 300 MG/ML  SOLN
75.0000 mL | Freq: Once | INTRAMUSCULAR | Status: AC | PRN
  Administered 2023-08-19: 75 mL via INTRAVENOUS

## 2023-08-28 ENCOUNTER — Encounter: Payer: Self-pay | Admitting: Rheumatology

## 2023-08-30 ENCOUNTER — Ambulatory Visit: Payer: Commercial Managed Care - PPO | Admitting: Family

## 2023-08-30 ENCOUNTER — Encounter: Payer: Self-pay | Admitting: Family

## 2023-08-30 VITALS — BP 128/85 | HR 99 | Temp 98.4°F | Ht 64.0 in

## 2023-08-30 DIAGNOSIS — Z0001 Encounter for general adult medical examination with abnormal findings: Secondary | ICD-10-CM

## 2023-08-30 DIAGNOSIS — F321 Major depressive disorder, single episode, moderate: Secondary | ICD-10-CM

## 2023-08-30 DIAGNOSIS — E785 Hyperlipidemia, unspecified: Secondary | ICD-10-CM

## 2023-08-30 DIAGNOSIS — K219 Gastro-esophageal reflux disease without esophagitis: Secondary | ICD-10-CM | POA: Diagnosis not present

## 2023-08-30 DIAGNOSIS — F411 Generalized anxiety disorder: Secondary | ICD-10-CM

## 2023-08-30 DIAGNOSIS — Z3041 Encounter for surveillance of contraceptive pills: Secondary | ICD-10-CM

## 2023-08-30 DIAGNOSIS — Z981 Arthrodesis status: Secondary | ICD-10-CM

## 2023-08-30 DIAGNOSIS — M4802 Spinal stenosis, cervical region: Secondary | ICD-10-CM

## 2023-08-30 DIAGNOSIS — Z0000 Encounter for general adult medical examination without abnormal findings: Secondary | ICD-10-CM

## 2023-08-30 DIAGNOSIS — E669 Obesity, unspecified: Secondary | ICD-10-CM

## 2023-08-30 MED ORDER — BUSPIRONE HCL 30 MG PO TABS: 30.0000 mg | ORAL_TABLET | Freq: Two times a day (BID) | ORAL | 1 refills | Status: DC

## 2023-08-30 MED ORDER — ATORVASTATIN CALCIUM 20 MG PO TABS: ORAL_TABLET | ORAL | 1 refills | Status: DC

## 2023-08-30 NOTE — Progress Notes (Signed)
 Subjective:    Patient ID: Alejandra Mcmahon, female    DOB: 1975/10/11, 48 y.o.   MRN: [id number]  Chief Complaint  Patient presents with   Medical Management of Chronic Issues    6 month   Pt presents to the office today for CPE and chronic follow up. States she has constant thirst with the Cymbalta . She has tried Wellbutrin, Lexapro , Pristq, and Celexa.    She is taking OC and last menstrual cycle was 08/24/23. Currently not smoking.   She had cervical discectomy of C3-4 and C4-5 on 04/08/23. Doing better, reports mild muscle pain of 4 out 10.  Gastroesophageal Reflux She complains of belching and heartburn. This is a chronic problem. The current episode started more than 1 year ago. The problem occurs occasionally. Risk factors include obesity. She has tried a diet change and an antacid for the symptoms. The treatment provided mild relief.  Hyperlipidemia This is a chronic problem. The current episode started more than 1 year ago. Exacerbating diseases include obesity. Current antihyperlipidemic treatment includes statins. The current treatment provides moderate improvement of lipids. Risk factors for coronary artery disease include dyslipidemia and a sedentary lifestyle.  Anxiety Presents for follow-up visit. Symptoms include depressed mood, excessive worry, nervous/anxious behavior and restlessness. Symptoms occur occasionally. The severity of symptoms is moderate.    Depression        This is a chronic problem.  The current episode started more than 1 year ago.   The problem occurs intermittently.  Associated symptoms include helplessness, hopelessness, restlessness and sad.  Past treatments include SNRIs - Serotonin and norepinephrine reuptake inhibitors.  Past medical history includes anxiety.   Neck Pain  This is a chronic problem. The current episode started more than 1 year ago. The problem occurs intermittently. The problem has been waxing and waning. The pain is at a severity  of 6/10. The pain is moderate. The symptoms are aggravated by bending (laying down). She has tried muscle relaxants for the symptoms.      Review of Systems  Gastrointestinal:  Positive for heartburn.  Musculoskeletal:  Positive for neck pain.  Psychiatric/Behavioral:  The patient is nervous/anxious.   All other systems reviewed and are negative.      Objective:   Physical Exam Vitals reviewed.  Constitutional:      General: She is not in acute distress.    Appearance: She is well-developed.  HENT:     Head: Normocephalic and atraumatic.     Right Ear: Tympanic membrane normal.     Left Ear: Tympanic membrane normal.  Eyes:     Pupils: Pupils are equal, round, and reactive to light.  Neck:     Thyroid : No thyromegaly.  Cardiovascular:     Rate and Rhythm: Normal rate and regular rhythm.     Heart sounds: Normal heart sounds. No murmur heard. Pulmonary:     Effort: Pulmonary effort is normal. No respiratory distress.     Breath sounds: Normal breath sounds. No wheezing.  Abdominal:     General: Bowel sounds are normal. There is no distension.     Palpations: Abdomen is soft.     Tenderness: There is no abdominal tenderness.  Musculoskeletal:        General: Tenderness present. Normal range of motion.     Cervical back: Normal range of motion and neck supple.     Right lower leg: Edema (trace) present.     Left lower leg: Edema (trace) present.  Comments: Mild Pain in cervical with right rotation  Skin:    General: Skin is warm and dry.  Neurological:     Mental Status: She is alert and oriented to person, place, and time.     Cranial Nerves: No cranial nerve deficit.     Deep Tendon Reflexes: Reflexes are normal and symmetric.  Psychiatric:        Behavior: Behavior normal.        Thought Content: Thought content normal.        Judgment: Judgment normal.      BP 128/85   Pulse 99   Temp 98.4 F (36.9 C)   Ht 5' 4 (1.626 m)   LMP 08/24/2023   SpO2 98%    BMI 32.10 kg/m       Assessment & Plan:  Alejandra Mcmahon comes in today with chief complaint of Medical Management of Chronic Issues (6 month)   Diagnosis and orders addressed:  1. Current moderate episode of major depressive disorder, unspecified whether recurrent (HCC) - busPIRone  (BUSPAR ) 30 MG tablet; Take 1 tablet (30 mg total) by mouth 2 (two) times daily.  Dispense: 180 tablet; Refill: 1 - CMP14+EGFR - CBC with Differential/Platelet  2. Hyperlipidemia, unspecified hyperlipidemia type - atorvastatin  (LIPITOR) 20 MG tablet; ( GENERIC EQUIVALENT TO LIPITOR) TAKE 1 TABLET BY MOUTH DAILY  Dispense: 90 tablet; Refill: 1 - Lipid panel - CMP14+EGFR - CBC with Differential/Platelet  3. Gastroesophageal reflux disease, unspecified whether esophagitis present - CMP14+EGFR - CBC with Differential/Platelet  4. GAD (generalized anxiety disorder) - busPIRone  (BUSPAR ) 30 MG tablet; Take 1 tablet (30 mg total) by mouth 2 (two) times daily.  Dispense: 180 tablet; Refill: 1 - CMP14+EGFR - CBC with Differential/Platelet  5. Obesity (BMI 30-39.9) - CMP14+EGFR - CBC with Differential/Platelet  6. Annual physical exam (Primary) - CMP14+EGFR - CBC with Differential/Platelet  7. S/P cervical spinal fusion  - CMP14+EGFR - CBC with Differential/Platelet  8. Neuroforaminal stenosis of cervical spine - CMP14+EGFR - CBC with Differential/Platelet  9. Encounter for surveillance of contraceptive pills - CMP14+EGFR - CBC with Differential/Platelet   Labs pending Continue current medications  Health Maintenance reviewed Diet and exercise encouraged  Follow up plan: 6 months   Bari Learn, FNP

## 2023-08-30 NOTE — Patient Instructions (Signed)

## 2023-08-31 LAB — CBC WITH DIFFERENTIAL/PLATELET
Basophils Absolute: 0.1 10*3/uL (ref 0.0–0.2)
Basos: 1 %
EOS (ABSOLUTE): 0.1 10*3/uL (ref 0.0–0.4)
Eos: 1 %
Hematocrit: 41.9 % (ref 34.0–46.6)
Hemoglobin: 13.7 g/dL (ref 11.1–15.9)
Immature Grans (Abs): 0 10*3/uL (ref 0.0–0.1)
Immature Granulocytes: 0 %
Lymphocytes Absolute: 2.7 10*3/uL (ref 0.7–3.1)
Lymphs: 29 %
MCH: 29.9 pg (ref 26.6–33.0)
MCHC: 32.7 g/dL (ref 31.5–35.7)
MCV: 92 fL (ref 79–97)
Monocytes Absolute: 0.6 10*3/uL (ref 0.1–0.9)
Monocytes: 6 %
Neutrophils Absolute: 5.6 10*3/uL (ref 1.4–7.0)
Neutrophils: 63 %
Platelets: 314 10*3/uL (ref 150–450)
RBC: 4.58 x10E6/uL (ref 3.77–5.28)
RDW: 11.8 % (ref 11.7–15.4)
WBC: 9.1 10*3/uL (ref 3.4–10.8)

## 2023-08-31 LAB — CMP14+EGFR
ALT: 14 [IU]/L (ref 0–32)
AST: 17 [IU]/L (ref 0–40)
Albumin: 4 g/dL (ref 3.9–4.9)
Alkaline Phosphatase: 66 [IU]/L (ref 44–121)
BUN/Creatinine Ratio: 21 (ref 9–23)
BUN: 16 mg/dL (ref 6–24)
Bilirubin Total: <0.2 mg/dL (ref 0.0–1.2)
CO2: 21 mmol/L (ref 20–29)
Calcium: 9.7 mg/dL (ref 8.7–10.2)
Chloride: 99 mmol/L (ref 96–106)
Creatinine, Ser: 0.76 mg/dL (ref 0.57–1.00)
Globulin, Total: 2 g/dL (ref 1.5–4.5)
Glucose: 90 mg/dL (ref 70–99)
Potassium: 4.2 mmol/L (ref 3.5–5.2)
Sodium: 135 mmol/L (ref 134–144)
Total Protein: 6 g/dL (ref 6.0–8.5)
eGFR: 97 mL/min/{1.73_m2} (ref >59)

## 2023-08-31 LAB — LIPID PANEL
Chol/HDL Ratio: 3.1 {ratio} (ref 0.0–4.4)
Cholesterol, Total: 168 mg/dL (ref 100–199)
HDL: 55 mg/dL (ref >39)
LDL Chol Calc (NIH): 76 mg/dL (ref 0–99)
Triglycerides: 225 mg/dL — ABNORMAL HIGH (ref 0–149)
VLDL Cholesterol Cal: 37 mg/dL (ref 5–40)

## 2023-09-13 ENCOUNTER — Other Ambulatory Visit: Payer: Self-pay | Admitting: Family

## 2023-09-13 DIAGNOSIS — Z1231 Encounter for screening mammogram for malignant neoplasm of breast: Secondary | ICD-10-CM

## 2023-09-30 ENCOUNTER — Ambulatory Visit: Admitting: Family

## 2023-09-30 VITALS — BP 124/87 | HR 118 | Temp 98.3°F | Ht 64.0 in | Wt 185.0 lb

## 2023-09-30 DIAGNOSIS — R6889 Other general symptoms and signs: Secondary | ICD-10-CM

## 2023-09-30 DIAGNOSIS — J029 Acute pharyngitis, unspecified: Secondary | ICD-10-CM | POA: Diagnosis not present

## 2023-09-30 LAB — RAPID STREP SCREEN (MED CTR MEBANE ONLY): Strep Gp A Ag, IA W/Reflex: NEGATIVE

## 2023-09-30 LAB — CULTURE, GROUP A STREP

## 2023-09-30 LAB — VERITOR FLU A/B WAIVED
Influenza A: NEGATIVE
Influenza B: NEGATIVE

## 2023-09-30 MED ORDER — CLINDAMYCIN HCL 300 MG PO CAPS: 300.0000 mg | ORAL_CAPSULE | Freq: Three times a day (TID) | ORAL | 0 refills | Status: AC

## 2023-09-30 NOTE — Patient Instructions (Signed)

## 2023-09-30 NOTE — Progress Notes (Signed)
 Subjective:    Patient ID: Alejandra Mcmahon, female    DOB: 1976-05-25, 48 y.o.   MRN: [id number]  Chief Complaint  Patient presents with   Sore Throat   Headache   Ear Pain   PT presents to the office today with sore throat that started 3 days ago. .  Sore Throat  This is a new problem. The current episode started in the past 7 days. The problem has been gradually worsening. The pain is worse on the right side. The maximum temperature recorded prior to her arrival was 100.4 - 100.9 F. The pain is at a severity of 7/10. The pain is moderate. Associated symptoms include coughing, ear pain, headaches, shortness of breath, swollen glands and trouble swallowing. Pertinent negatives include no congestion. She has had no exposure to strep. She has tried acetaminophen for the symptoms. The treatment provided mild relief.  Headache  Associated symptoms include coughing, ear pain and swollen glands.      Review of Systems  HENT:  Positive for ear pain and trouble swallowing. Negative for congestion.   Respiratory:  Positive for cough and shortness of breath.   Neurological:  Positive for headaches.  All other systems reviewed and are negative.   Social History   Socioeconomic History   Marital status: Married    Spouse name: Not on file   Number of children: Not on file   Years of education: Not on file   Highest education level: Bachelor's degree (e.g., BA, AB, BS)  Occupational History   Not on file  Tobacco Use   Smoking status: Never    Passive exposure: Current   Smokeless tobacco: Never  Vaping Use   Vaping status: Never Used  Substance and Sexual Activity   Alcohol use: No   Drug use: No   Sexual activity: Not on file  Other Topics Concern   Not on file  Social History Narrative   Not on file   Social Drivers of Health   Financial Resource Strain: Low Risk  (09/30/2023)   Overall Financial Resource Strain (CARDIA)    Difficulty of Paying Living Expenses: Not very  hard  Food Insecurity: No Food Insecurity (09/30/2023)   Hunger Vital Sign    Worried About Running Out of Food in the Last Year: Never true    Ran Out of Food in the Last Year: Never true  Transportation Needs: No Transportation Needs (09/30/2023)   PRAPARE - Administrator, Civil Service (Medical): No    Lack of Transportation (Non-Medical): No  Physical Activity: Sufficiently Active (09/30/2023)   Exercise Vital Sign    Days of Exercise per Week: 5 days    Minutes of Exercise per Session: 30 min  Stress: Stress Concern Present (09/30/2023)   [employer] of Occupational Health - Occupational Stress Questionnaire    Feeling of Stress : Rather much  Social Connections: Socially Integrated (09/30/2023)   Social Connection and Isolation Panel [NHANES]    Frequency of Communication with Friends and Family: More than three times a week    Frequency of Social Gatherings with Friends and Family: More than three times a week    Attends Religious Services: More than 4 times per year    Active Member of [employer] or Organizations: Yes    Attends Engineer, structural: More than 4 times per year    Marital Status: Married   Family History  Problem Relation Age of Onset   Cancer Mother  breast   Breast cancer Mother    Healthy Son    Healthy Daughter    Scoliosis Daughter         Objective:   Physical Exam Vitals reviewed.  Constitutional:      General: She is not in acute distress.    Appearance: She is well-developed.  HENT:     Head: Normocephalic and atraumatic.     Right Ear: Tympanic membrane normal.     Left Ear: Tympanic membrane normal.     Mouth/Throat:     Pharynx: Pharyngeal swelling, oropharyngeal exudate and posterior oropharyngeal erythema present.     Tonsils: Tonsillar exudate present. 2+ on the right.  Eyes:     Pupils: Pupils are equal, round, and reactive to light.  Neck:     Thyroid: No thyromegaly.  Cardiovascular:     Rate and  Rhythm: Normal rate and regular rhythm.     Heart sounds: Normal heart sounds. No murmur heard. Pulmonary:     Effort: Pulmonary effort is normal. No respiratory distress.     Breath sounds: Normal breath sounds. No wheezing.  Abdominal:     General: Bowel sounds are normal. There is no distension.     Palpations: Abdomen is soft.     Tenderness: There is no abdominal tenderness.  Musculoskeletal:        General: No tenderness. Normal range of motion.     Cervical back: Normal range of motion and neck supple.  Skin:    General: Skin is warm and dry.  Neurological:     Mental Status: She is alert and oriented to person, place, and time.     Cranial Nerves: No cranial nerve deficit.     Deep Tendon Reflexes: Reflexes are normal and symmetric.  Psychiatric:        Behavior: Behavior normal.        Thought Content: Thought content normal.        Judgment: Judgment normal.       BP 124/87   Pulse (!) 118   Temp 98.3 F (36.8 C) (Temporal)   Ht 5\' 4"  (1.626 m)   Wt 185 lb (83.9 kg)   BMI 31.76 kg/m      Assessment & Plan:  Alejandra Mcmahon comes in today with chief complaint of Sore Throat, Headache, and Ear Pain   Diagnosis and orders addressed:  1. Flu-like symptoms (Primary) - Veritor Flu A/B Waived  2. Sore throat - Rapid Strep Screen ([facility] ONLY)  3. Acute pharyngitis, unspecified etiology - Take meds as prescribed - Use a cool mist humidifier  -Use saline nose sprays frequently -Force fluids -For any cough or congestion  Use plain Mucinex- regular strength or max strength is fine -For fever or aces or pains- take tylenol or ibuprofen. -Throat lozenges if help -New toothbrush in 3 days Follow up if symptoms  - clindamycin (CLEOCIN) 300 MG capsule; Take 1 capsule (300 mg total) by mouth 3 (three) times daily for 10 days.  Dispense: 30 capsule; Refill: 0     Jannifer Rodney, FNP

## 2023-10-11 ENCOUNTER — Other Ambulatory Visit: Payer: Self-pay | Admitting: Family

## 2023-10-11 DIAGNOSIS — E785 Hyperlipidemia, unspecified: Secondary | ICD-10-CM

## 2023-10-14 ENCOUNTER — Ambulatory Visit
Admission: RE | Admit: 2023-10-14 | Discharge: 2023-10-14 | Disposition: A | Discharge status: Home / Self Care | Payer: Commercial Managed Care - PPO | Source: Ambulatory Visit

## 2023-10-14 DIAGNOSIS — Z1231 Encounter for screening mammogram for malignant neoplasm of breast: Secondary | ICD-10-CM

## 2023-10-22 ENCOUNTER — Other Ambulatory Visit: Payer: Self-pay | Admitting: Family

## 2023-10-22 DIAGNOSIS — Z3041 Encounter for surveillance of contraceptive pills: Secondary | ICD-10-CM

## 2023-10-28 ENCOUNTER — Ambulatory Visit
Admission: RE | Admit: 2023-10-28 | Discharge: 2023-10-28 | Disposition: A | Discharge status: Home / Self Care | Source: Ambulatory Visit | Attending: Nurse Practitioner | Admitting: Nurse Practitioner

## 2023-10-28 VITALS — BP 139/85 | HR 111 | Temp 98.7°F | Resp 18

## 2023-10-28 DIAGNOSIS — R051 Acute cough: Secondary | ICD-10-CM

## 2023-10-28 DIAGNOSIS — J301 Allergic rhinitis due to pollen: Secondary | ICD-10-CM

## 2023-10-28 DIAGNOSIS — J00 Acute nasopharyngitis [common cold]: Secondary | ICD-10-CM

## 2023-10-28 MED ORDER — PROMETHAZINE-DM 6.25-15 MG/5ML PO SYRP: 5.0000 mL | ORAL_SOLUTION | Freq: Four times a day (QID) | ORAL | 0 refills | Status: DC | PRN

## 2023-10-28 NOTE — ED Triage Notes (Signed)
 Cough, sinus pressure and pain x 3 days. Taking mucinex and aleve.

## 2023-10-28 NOTE — Discharge Instructions (Addendum)
 You have been diagnosed with acute nasopharyngitis.  The recommendation is for you to continue to treat symptoms.  You have declined COVID, Influenza and Strep Test testing due to recent positive strep with antibiotic treatment. We encourage you to use daily antihistamine and wearing a mask during the season of increased pollen production especially related to your job.  You are also encouraged to use of Flonase as directed.  Tylenol alternating with Ibuprofen for your fever if not contraindicated. (Remember to use as directed do not exceed daily dosing recommendations). We also encourage salt water gargles for your sore throat. You should also consider throat lozenges and chloraseptic spray.   Your cough can be soothed with a cough suppressant. We have prescribed you a cough suppressant to be taken as  directed; Promethazine DM 5 mL 4 times daily as needed for cough.

## 2023-10-28 NOTE — ED Provider Notes (Addendum)
 [facility]    CSN: [id number] Arrival date & time: 10/28/23  0853      History   Chief Complaint Chief Complaint  Patient presents with   Cough    I started feeling bad Thurs. On Friday I was starting to cough, sneeze, and had a fever of 100.2. The cough has worsened since then and I'm concerned I have bronchitis. It hurts when I cough, in my chest, sometimes to the point where I double over. - Entered by patient    HPI Alejandra Mcmahon is a 48 y.o. female.   HPI  She is in today for evaluation of persistent cough.  She does have a history of allergies currently taking Benadryl at night.  Her med list indicates Flonase once daily.  She reports that she was recently diagnosed with strep throat was on antibiotic for 10 days.  She also endorses that her daughter was sick and was treated.  Her temperature was 100.2.  She endorses some headache, sore throat with cough questionable wheezing on yesterday.  She is a victim of secondhand smoke.  She endorses that she works in the [employer].  She is not wearing a mask even though she is in and out of buildings.  She denies any dizziness,, ear pain shortness of breath or chest pain Past Medical History:  Diagnosis Date   Allergy    Anxiety    Arthritis    Depression    GERD (gastroesophageal reflux disease)     Patient Active Problem List   Diagnosis Date Noted   S/P cervical spinal fusion 04/08/2023   Neuroforaminal stenosis of cervical spine 01/22/2023   Multilevel cervical spondylosis without myelopathy 01/22/2023   Myelomalacia of cervical cord (HCC) 01/22/2023   Chronic bilateral low back pain without sciatica 10/15/2022   Hyperlipidemia 09/25/2021   Obesity (BMI 30-39.9) 05/27/2018   Encounter for surveillance of contraceptive pills 02/18/2017   GAD (generalized anxiety disorder) 05/09/2015   GERD (gastroesophageal reflux disease) 03/16/2013   Depression 03/16/2013   Allergic rhinitis 03/16/2013    Past Surgical  History:  Procedure Laterality Date   ANTERIOR CERVICAL DECOMP/DISCECTOMY FUSION N/A 04/08/2023   Procedure: C3-4, C4-5 ANTERIOR CERVICAL DISCECTOMY FUSION, ALLOGRAFT, PLATE;  Surgeon: Eldred Manges, MD;  Location: [facility] OR;  Service: Orthopedics;  Laterality: N/A;   CESAREAN SECTION     x2    OB History   No obstetric history on file.      Home Medications    Prior to Admission medications   Medication Sig Start Date End Date Taking? Authorizing Provider  Ascorbic Acid (VITAMIN C PO) Take 3 tablets by mouth daily.   Yes [provider]  atorvastatin (LIPITOR) 20 MG tablet ( GENERIC EQUIVALENT TO LIPITOR) TAKE 1 TABLET BY MOUTH DAILY 08/30/23  Yes Hawks, [name] A, FNP  busPIRone (BUSPAR) 30 MG tablet Take 1 tablet (30 mg total) by mouth 2 (two) times daily. 08/30/23  Yes Hawks, [name] A, FNP  CALCIUM PO Take 2 tablets by mouth 3 (three) times a week.   Yes [provider]  cyclobenzaprine (FLEXERIL) 10 MG tablet Take 1 tablet (10 mg total) by mouth 3 (three) times daily. Patient taking differently: Take 10 mg by mouth at bedtime as needed for muscle spasms. 10/15/22  Yes Hawks, [name] A, FNP  DULoxetine (CYMBALTA) 60 MG capsule Take 60 mg by mouth daily. 05/22/23  Yes [provider]  Multiple Vitamin (MULTIVITAMIN WITH MINERALS) TABS tablet Take 2 tablets by  mouth daily.   Yes [provider]  norgestimate-ethinyl estradiol (MILI) 0.25-35 MG-MCG tablet TAKE 1 TABLET BY MOUTH EVERY DAY 10/22/23  Yes Hawks, [name] A, FNP  pilocarpine (SALAGEN) 5 MG tablet Take 1 tablet (5 mg total) by mouth 3 (three) times daily as needed. 08/02/23  Yes Deveshwar, Janalyn Rouse, MD  promethazine-dextromethorphan (PROMETHAZINE-DM) 6.25-15 MG/5ML syrup Take 5 mLs by mouth 4 (four) times daily as needed for cough. 10/28/23  Yes Barbette Merino, NP  Sodium Bicarbonate-Citric Acid (ALKA-SELTZER HEARTBURN RELIEF PO) Take 2 tablets by mouth daily as needed (heartburn).   Yes [provider]  diclofenac Sodium (VOLTAREN) 1 % GEL Apply 1 Application topically 4 (four) times daily as needed (pain).    [provider]  fluticasone (FLONASE) 50 MCG/ACT nasal spray Place 1 spray into both nostrils daily as needed for allergies or rhinitis.    [provider]    Family History Family History  Problem Relation Age of Onset   Cancer Mother        breast   Breast cancer Mother    Healthy Son    Healthy Daughter    Scoliosis Daughter     Social History Social History   Tobacco Use   Smoking status: Never    Passive exposure: Current   Smokeless tobacco: Never  Vaping Use   Vaping status: Never Used  Substance Use Topics   Alcohol use: No   Drug use: No     Allergies   Wheat, Celexa [citalopram hydrobromide], Diclofenac, Tape, Wellbutrin [bupropion], and Amoxicillin   Review of Systems Review of Systems   Physical Exam Triage Vital Signs ED Triage Vitals [10/28/23 0922]  Encounter Vitals Group     BP 139/85     Systolic BP Percentile      Diastolic BP Percentile      Pulse Rate (!) 111     Resp 18     Temp 98.7 F (37.1 C)     Temp Source Oral     SpO2 97 %     Weight      Height      Head Circumference      Peak Flow      Pain Score 4     Pain Loc      Pain Education      Exclude from Growth Chart    No data found.  Updated Vital Signs BP 139/85 (BP Location: Right Arm)   Pulse (!) 111   Temp 98.7 F (37.1 C) (Oral)   Resp 18   LMP 10/14/2023 (Approximate)   SpO2 97%   Visual Acuity Right Eye Distance:   Left Eye Distance:   Bilateral Distance:    Right Eye Near:   Left Eye Near:    Bilateral Near:     Physical Exam Constitutional:      General: She is not in acute distress.    Appearance: She is not ill-appearing, toxic-appearing or diaphoretic.  HENT:     Head: Normocephalic and atraumatic.     Right Ear: Tympanic membrane normal.     Left Ear: Tympanic membrane normal.     Nose: Nose  normal.     Mouth/Throat:     Mouth: Mucous membranes are moist.  Eyes:     Extraocular Movements: Extraocular movements intact.     Pupils: Pupils are equal, round, and reactive to light.  Cardiovascular:     Rate and Rhythm: Tachycardia present.  Pulmonary:  Effort: Pulmonary effort is normal. No respiratory distress.     Breath sounds: No stridor. No wheezing, rhonchi or rales.  Chest:     Chest wall: No tenderness.  Musculoskeletal:        General: Normal range of motion.     Cervical back: Normal range of motion.  Skin:    General: Skin is warm and dry.     Capillary Refill: Capillary refill takes less than 2 seconds.  Neurological:     General: No focal deficit present.     Mental Status: She is alert and oriented to person, place, and time.  Psychiatric:        Mood and Affect: Mood normal.      UC Treatments / Results  Labs (all labs ordered are listed, but only abnormal results are displayed) Labs Reviewed - No data to display  EKG   Radiology No results found.  Procedures Procedures (including critical care time)  Medications Ordered in UC Medications - No data to display  Initial Impression / Assessment and Plan / UC Course  I have reviewed the triage vital signs and the nursing notes.  Pertinent labs & imaging results that were available during my care of the patient were reviewed by me and considered in my medical decision making (see chart for details).     cough Final Clinical Impressions(s) / UC Diagnoses   Final diagnoses:  Seasonal allergic rhinitis due to pollen  Acute cough  Acute nasopharyngitis (common cold)     Discharge Instructions      You have been diagnosed with acute nasopharyngitis.  The recommendation is for you to continue to treat symptoms.  You have declined COVID, Influenza and Strep Test testing due to recent positive strep with antibiotic treatment. We encourage you to use daily antihistamine and wearing a mask  during the season of increased pollen production especially related to your job.  You are also encouraged to use of Flonase as directed.  Tylenol alternating with Ibuprofen for your fever if not contraindicated. (Remember to use as directed do not exceed daily dosing recommendations). We also encourage salt water gargles for your sore throat. You should also consider throat lozenges and chloraseptic spray.   Your cough can be soothed with a cough suppressant. We have prescribed you a cough suppressant to be taken as  directed; Promethazine DM 5 mL 4 times daily as needed for cough.      ED Prescriptions     Medication Sig Dispense Auth. Provider   promethazine-dextromethorphan (PROMETHAZINE-DM) 6.25-15 MG/5ML syrup Take 5 mLs by mouth 4 (four) times daily as needed for cough. 118 mL Barbette Merino, NP      PDMP not reviewed this encounter.   Barbette Merino, NP 10/28/23 0948    Barbette Merino, NP 10/28/23 [phone]

## 2023-11-03 ENCOUNTER — Other Ambulatory Visit: Payer: Self-pay | Admitting: Family

## 2023-11-06 ENCOUNTER — Other Ambulatory Visit: Payer: Self-pay | Admitting: *Deleted

## 2023-11-06 DIAGNOSIS — M3500 Sicca syndrome, unspecified: Secondary | ICD-10-CM

## 2023-11-06 MED ORDER — PILOCARPINE HCL 5 MG PO TABS
5.0000 mg | ORAL_TABLET | Freq: Three times a day (TID) | ORAL | 3 refills | Status: AC | PRN
Start: 2023-11-06 — End: ?

## 2023-11-06 NOTE — Telephone Encounter (Signed)
 Refill request received via fax from [email or web address] for Pilocarpine  Last Fill: 08/02/2023  Next Visit: 01/23/2024  Last Visit: 07/11/2023  Dx: Sicca syndrome   Current Dose per office note on 07/11/2023: pilocarpine does not last for more than 4 hours. Will increase the dose of pilocarpine to 3 times daily.   Okay to refill Pilocarpine?

## 2023-11-19 ENCOUNTER — Encounter: Payer: Self-pay | Admitting: Family Medicine
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
## 2024-01-10 ENCOUNTER — Other Ambulatory Visit: Payer: Self-pay | Admitting: Family

## 2024-01-13 ENCOUNTER — Ambulatory Visit: Payer: Commercial Managed Care - PPO | Admitting: Rheumatology

## 2024-01-23 ENCOUNTER — Encounter: Payer: Self-pay | Admitting: Rheumatology

## 2024-01-23 ENCOUNTER — Ambulatory Visit: Payer: Commercial Managed Care - PPO | Attending: Rheumatology | Admitting: Rheumatology

## 2024-01-23 VITALS — BP 132/87 | HR 108 | Resp 16 | Ht 64.0 in | Wt 191.2 lb

## 2024-01-23 DIAGNOSIS — M79641 Pain in right hand: Secondary | ICD-10-CM | POA: Diagnosis not present

## 2024-01-23 DIAGNOSIS — Z981 Arthrodesis status: Secondary | ICD-10-CM

## 2024-01-23 DIAGNOSIS — M25561 Pain in right knee: Secondary | ICD-10-CM | POA: Diagnosis not present

## 2024-01-23 DIAGNOSIS — M3500 Sicca syndrome, unspecified: Secondary | ICD-10-CM

## 2024-01-23 DIAGNOSIS — R5383 Other fatigue: Secondary | ICD-10-CM

## 2024-01-23 DIAGNOSIS — M79672 Pain in left foot: Secondary | ICD-10-CM

## 2024-01-23 DIAGNOSIS — M545 Low back pain, unspecified: Secondary | ICD-10-CM

## 2024-01-23 DIAGNOSIS — F419 Anxiety disorder, unspecified: Secondary | ICD-10-CM

## 2024-01-23 DIAGNOSIS — G8929 Other chronic pain: Secondary | ICD-10-CM

## 2024-01-23 DIAGNOSIS — K219 Gastro-esophageal reflux disease without esophagitis: Secondary | ICD-10-CM

## 2024-01-23 DIAGNOSIS — J3089 Other allergic rhinitis: Secondary | ICD-10-CM

## 2024-01-23 DIAGNOSIS — M79671 Pain in right foot: Secondary | ICD-10-CM

## 2024-01-23 DIAGNOSIS — M25562 Pain in left knee: Secondary | ICD-10-CM

## 2024-01-23 DIAGNOSIS — M79642 Pain in left hand: Secondary | ICD-10-CM

## 2024-01-23 DIAGNOSIS — K9041 Non-celiac gluten sensitivity: Secondary | ICD-10-CM

## 2024-01-23 DIAGNOSIS — F32A Depression, unspecified: Secondary | ICD-10-CM

## 2024-01-23 DIAGNOSIS — M791 Myalgia, unspecified site: Secondary | ICD-10-CM

## 2024-01-23 DIAGNOSIS — E782 Mixed hyperlipidemia: Secondary | ICD-10-CM

## 2024-03-10 ENCOUNTER — Other Ambulatory Visit: Payer: Self-pay | Admitting: *Deleted

## 2024-03-10 DIAGNOSIS — M545 Low back pain, unspecified: Secondary | ICD-10-CM

## 2024-03-10 MED ORDER — CYCLOBENZAPRINE HCL 10 MG PO TABS: 10.0000 mg | ORAL_TABLET | Freq: Three times a day (TID) | ORAL | 2 refills | Status: AC

## 2024-03-20 ENCOUNTER — Other Ambulatory Visit: Payer: Self-pay | Admitting: Family

## 2024-03-27 ENCOUNTER — Encounter: Payer: Self-pay | Admitting: Rheumatology

## 2024-03-27 NOTE — Telephone Encounter (Signed)
 We have placed an order for future labs.  Patient recommended to get future labs.  She may also schedule an appointment for evaluation of her symptoms.

## 2024-03-30 ENCOUNTER — Other Ambulatory Visit: Payer: Self-pay

## 2024-03-30 DIAGNOSIS — M3500 Sicca syndrome, unspecified: Secondary | ICD-10-CM
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
## 2024-03-31 ENCOUNTER — Encounter: Payer: Self-pay | Admitting: Physician Assistant

## 2024-03-31 ENCOUNTER — Ambulatory Visit: Attending: Physician Assistant | Admitting: Physician Assistant

## 2024-03-31 VITALS — BP 133/88 | HR 98 | Resp 13 | Ht 64.0 in | Wt 182.4 lb

## 2024-03-31 DIAGNOSIS — K219 Gastro-esophageal reflux disease without esophagitis: Secondary | ICD-10-CM

## 2024-03-31 DIAGNOSIS — M79671 Pain in right foot: Secondary | ICD-10-CM | POA: Diagnosis not present

## 2024-03-31 DIAGNOSIS — M3500 Sicca syndrome, unspecified: Secondary | ICD-10-CM

## 2024-03-31 DIAGNOSIS — M79641 Pain in right hand: Secondary | ICD-10-CM | POA: Diagnosis not present

## 2024-03-31 DIAGNOSIS — K9041 Non-celiac gluten sensitivity: Secondary | ICD-10-CM

## 2024-03-31 DIAGNOSIS — R5383 Other fatigue: Secondary | ICD-10-CM

## 2024-03-31 DIAGNOSIS — M79642 Pain in left hand: Secondary | ICD-10-CM

## 2024-03-31 DIAGNOSIS — F419 Anxiety disorder, unspecified: Secondary | ICD-10-CM

## 2024-03-31 DIAGNOSIS — F32A Depression, unspecified: Secondary | ICD-10-CM

## 2024-03-31 DIAGNOSIS — M25561 Pain in right knee: Secondary | ICD-10-CM

## 2024-03-31 DIAGNOSIS — M79672 Pain in left foot: Secondary | ICD-10-CM

## 2024-03-31 DIAGNOSIS — J3089 Other allergic rhinitis: Secondary | ICD-10-CM

## 2024-03-31 DIAGNOSIS — M25562 Pain in left knee: Secondary | ICD-10-CM

## 2024-03-31 DIAGNOSIS — M791 Myalgia, unspecified site: Secondary | ICD-10-CM

## 2024-03-31 DIAGNOSIS — G8929 Other chronic pain: Secondary | ICD-10-CM

## 2024-03-31 DIAGNOSIS — M545 Low back pain, unspecified: Secondary | ICD-10-CM

## 2024-03-31 DIAGNOSIS — E782 Mixed hyperlipidemia: Secondary | ICD-10-CM

## 2024-03-31 DIAGNOSIS — Z981 Arthrodesis status: Secondary | ICD-10-CM

## 2024-04-01 ENCOUNTER — Ambulatory Visit: Payer: Self-pay | Admitting: Rheumatology

## 2024-04-01 LAB — CBC WITH DIFFERENTIAL/PLATELET
Absolute Lymphocytes: 1905 {cells}/uL (ref 850–3900)
Absolute Monocytes: 443 {cells}/uL (ref 200–950)
Basophils Absolute: 83 {cells}/uL (ref 0–200)
Basophils Relative: 1.1 %
Eosinophils Absolute: 173 {cells}/uL (ref 15–500)
Eosinophils Relative: 2.3 %
HCT: 44.6 % (ref 35.0–45.0)
Hemoglobin: 14.5 g/dL (ref 11.7–15.5)
MCH: 30.3 pg (ref 27.0–33.0)
MCHC: 32.5 g/dL (ref 32.0–36.0)
MCV: 93.3 fL (ref 80.0–100.0)
MPV: 10.7 fL (ref 7.5–12.5)
Monocytes Relative: 5.9 %
Neutro Abs: 4898 {cells}/uL (ref 1500–7800)
Neutrophils Relative %: 65.3 %
Platelets: 287 Thousand/uL (ref 140–400)
RBC: 4.78 Million/uL (ref 3.80–5.10)
RDW: 11.9 % (ref 11.0–15.0)
Total Lymphocyte: 25.4 %
WBC: 7.5 Thousand/uL (ref 3.8–10.8)

## 2024-04-01 LAB — PROTEIN / CREATININE RATIO, URINE
Creatinine, Urine: 120 mg/dL (ref 20–275)
Protein/Creat Ratio: 50 mg/g{creat} (ref 24–184)
Protein/Creatinine Ratio: 0.05 mg/mg{creat} (ref 0.024–0.184)
Total Protein, Urine: 6 mg/dL (ref 5–24)

## 2024-04-01 LAB — COMPREHENSIVE METABOLIC PANEL WITH GFR
AG Ratio: 1.6 (calc) (ref 1.0–2.5)
ALT: 14 U/L (ref 6–29)
AST: 16 U/L (ref 10–35)
Albumin: 3.9 g/dL (ref 3.6–5.1)
Alkaline phosphatase (APISO): 52 U/L (ref 31–125)
BUN: 11 mg/dL (ref 7–25)
CO2: 27 mmol/L (ref 20–32)
Calcium: 9.5 mg/dL (ref 8.6–10.2)
Chloride: 103 mmol/L (ref 98–110)
Creat: 0.79 mg/dL (ref 0.50–0.99)
Globulin: 2.5 g/dL (ref 1.9–3.7)
Glucose, Bld: 103 mg/dL — ABNORMAL HIGH (ref 65–99)
Potassium: 4.5 mmol/L (ref 3.5–5.3)
Sodium: 139 mmol/L (ref 135–146)
Total Bilirubin: 0.3 mg/dL (ref 0.2–1.2)
Total Protein: 6.4 g/dL (ref 6.1–8.1)
eGFR: 93 mL/min/1.73m2 (ref >=60)

## 2024-04-01 LAB — ANA: Anti Nuclear Antibody (ANA): POSITIVE — AB

## 2024-04-01 LAB — ANTI-DNA ANTIBODY, DOUBLE-STRANDED: ds DNA Ab: <1 [IU]/mL

## 2024-04-01 LAB — C3 AND C4
C3 Complement: 153 mg/dL (ref 83–193)
C4 Complement: 29 mg/dL (ref 15–57)

## 2024-04-01 LAB — ANTI-NUCLEAR AB-TITER (ANA TITER)
ANA TITER: 1:40 {titer} — ABNORMAL HIGH
ANA Titer 1: 1:40 {titer} — ABNORMAL HIGH

## 2024-04-01 LAB — SEDIMENTATION RATE: Sed Rate: 6 mm/h (ref 0–20)

## 2024-04-01 LAB — SJOGRENS SYNDROME-A EXTRACTABLE NUCLEAR ANTIBODY: SSA (Ro) (ENA) Antibody, IgG: <1 AI

## 2024-04-01 LAB — SJOGRENS SYNDROME-B EXTRACTABLE NUCLEAR ANTIBODY: SSB (La) (ENA) Antibody, IgG: <1 AI

## 2024-04-01 NOTE — Progress Notes (Signed)
 ANA is low titer positive, all other labs are within normal limits.  Will discuss results at the follow-up visit.

## 2024-04-01 NOTE — Progress Notes (Signed)
 All the labs are within normal limits except ANA titer is pending.

## 2024-04-06 ENCOUNTER — Encounter: Payer: Self-pay | Admitting: Rheumatology

## 2024-04-07 NOTE — Telephone Encounter (Signed)
 Sjogren's specific antibodies are negative.  ANA is low titer positive which is nonspecific.

## 2024-04-16 ENCOUNTER — Other Ambulatory Visit: Payer: Self-pay | Admitting: Family

## 2024-04-16 DIAGNOSIS — E785 Hyperlipidemia, unspecified: Secondary | ICD-10-CM

## 2024-04-23 ENCOUNTER — Encounter: Payer: Self-pay | Admitting: Rheumatology

## 2024-04-23 NOTE — Telephone Encounter (Signed)
 Ok to proceed with vaccines.  Ok to get on same day or space.

## 2024-05-09 ENCOUNTER — Other Ambulatory Visit: Payer: Self-pay | Admitting: Family

## 2024-05-09 DIAGNOSIS — E785 Hyperlipidemia, unspecified: Secondary | ICD-10-CM

## 2024-05-11 ENCOUNTER — Encounter: Payer: Self-pay | Admitting: Family

## 2024-05-11 NOTE — Telephone Encounter (Signed)
 LMTCB to schedule appt Letter mailed

## 2024-05-11 NOTE — Telephone Encounter (Signed)
 [name] pt NTBS 30-d given 04/16/24

## 2024-05-25 ENCOUNTER — Encounter: Payer: Self-pay | Admitting: Radiology

## 2024-05-29 ENCOUNTER — Encounter: Payer: Self-pay | Admitting: Family

## 2024-05-29 ENCOUNTER — Other Ambulatory Visit (HOSPITAL_COMMUNITY)
Admission: RE | Admit: 2024-05-29 | Discharge: 2024-05-29 | Disposition: A | Discharge status: Home / Self Care | Source: Ambulatory Visit | Attending: Family | Admitting: Family

## 2024-05-29 ENCOUNTER — Ambulatory Visit: Payer: Self-pay | Admitting: Family

## 2024-05-29 VITALS — BP 125/85 | HR 109 | Temp 98.1°F | Wt 183.4 lb

## 2024-05-29 DIAGNOSIS — E669 Obesity, unspecified: Secondary | ICD-10-CM

## 2024-05-29 DIAGNOSIS — K219 Gastro-esophageal reflux disease without esophagitis: Secondary | ICD-10-CM

## 2024-05-29 DIAGNOSIS — Z981 Arthrodesis status: Secondary | ICD-10-CM

## 2024-05-29 DIAGNOSIS — Z01419 Encounter for gynecological examination (general) (routine) without abnormal findings: Secondary | ICD-10-CM

## 2024-05-29 DIAGNOSIS — Z3041 Encounter for surveillance of contraceptive pills: Secondary | ICD-10-CM

## 2024-05-29 DIAGNOSIS — E785 Hyperlipidemia, unspecified: Secondary | ICD-10-CM | POA: Diagnosis not present

## 2024-05-29 DIAGNOSIS — M3500 Sicca syndrome, unspecified: Secondary | ICD-10-CM

## 2024-05-29 DIAGNOSIS — Z01411 Encounter for gynecological examination (general) (routine) with abnormal findings: Secondary | ICD-10-CM

## 2024-05-29 DIAGNOSIS — F411 Generalized anxiety disorder: Secondary | ICD-10-CM

## 2024-05-29 DIAGNOSIS — F321 Major depressive disorder, single episode, moderate: Secondary | ICD-10-CM | POA: Diagnosis not present

## 2024-05-29 DIAGNOSIS — N952 Postmenopausal atrophic vaginitis: Secondary | ICD-10-CM

## 2024-05-29 DIAGNOSIS — M47812 Spondylosis without myelopathy or radiculopathy, cervical region: Secondary | ICD-10-CM

## 2024-05-29 MED ORDER — DULOXETINE HCL 60 MG PO CPEP: 60.0000 mg | ORAL_CAPSULE | Freq: Every day | ORAL | 1 refills | Status: DC

## 2024-05-29 MED ORDER — BUSPIRONE HCL 30 MG PO TABS: 30.0000 mg | ORAL_TABLET | Freq: Two times a day (BID) | ORAL | 1 refills | Status: DC

## 2024-05-29 MED ORDER — ATORVASTATIN CALCIUM 20 MG PO TABS: 20.0000 mg | ORAL_TABLET | Freq: Every day | ORAL | 2 refills | Status: AC

## 2024-05-29 MED ORDER — ESTRADIOL 0.01 % VA CREA
1.0000 | TOPICAL_CREAM | VAGINAL | 12 refills | Status: AC
Start: 2024-05-29 — End: ?

## 2024-05-29 NOTE — Progress Notes (Signed)
 Subjective:    Patient ID: Alejandra Mcmahon, female    DOB: Oct 06, 1975, 48 y.o.   MRN: [id number]  Chief Complaint  Patient presents with   Annual Exam   Pt presents to the office today for pap and chronic follow up.   States she has constant thirst with the Cymbalta . She has tried Wellbutrin, Lexapro , Pristq, and Celexa.   She is followed by Rheumatologists for Sjogrens.    She is taking OC. Currently not smoking.   She had cervical discectomy of C3-4 and C4-5 on 04/08/23. Doing better, reports mild muscle pain of 4 out 10.  Gastroesophageal Reflux She complains of belching and heartburn. vag dryness. This is a chronic problem. The current episode started more than 1 year ago. The problem occurs occasionally. The symptoms are aggravated by certain foods. Risk factors include obesity. She has tried a diet change and an antacid for the symptoms. The treatment provided mild relief.  Hyperlipidemia This is a chronic problem. The current episode started more than 1 year ago. The problem is controlled. Recent lipid tests were reviewed and are normal. Exacerbating diseases include obesity. Current antihyperlipidemic treatment includes statins. The current treatment provides moderate improvement of lipids. Risk factors for coronary artery disease include dyslipidemia and a sedentary lifestyle.  Anxiety Presents for follow-up visit. Symptoms include depressed mood, excessive worry, nervous/anxious behavior and restlessness. Primary symptoms comment: vag dryness. Symptoms occur occasionally. The severity of symptoms is moderate.    Depression      (vag dryness)  This is a chronic problem.  The current episode started more than 1 year ago.   The problem occurs intermittently.  Associated symptoms include helplessness, hopelessness, restlessness and sad.  Past treatments include SNRIs - Serotonin and norepinephrine reuptake inhibitors.  Past medical history includes anxiety.   Neck Pain  This is a  chronic problem. The current episode started more than 1 year ago. The problem occurs intermittently. The problem has been waxing and waning. The pain is at a severity of 4/10. The pain is moderate. The symptoms are aggravated by bending (laying down). She has tried muscle relaxants for the symptoms.  Gynecologic Exam The patient's pertinent negatives include no genital itching, genital odor or vaginal discharge. Primary symptoms comment: vag dryness.      Review of Systems  Gastrointestinal:  Positive for heartburn.  Genitourinary:  Negative for vaginal discharge.  Musculoskeletal:  Positive for neck pain.  Psychiatric/Behavioral:  The patient is nervous/anxious.   All other systems reviewed and are negative.      Objective:   Physical Exam Vitals reviewed.  Constitutional:      General: She is not in acute distress.    Appearance: She is well-developed.  HENT:     Head: Normocephalic and atraumatic.     Right Ear: Tympanic membrane normal.     Left Ear: Tympanic membrane normal.  Eyes:     Pupils: Pupils are equal, round, and reactive to light.  Neck:     Thyroid : No thyromegaly.  Cardiovascular:     Rate and Rhythm: Normal rate and regular rhythm.     Heart sounds: Normal heart sounds. No murmur heard. Pulmonary:     Effort: Pulmonary effort is normal. No respiratory distress.     Breath sounds: Normal breath sounds. No wheezing.  Abdominal:     General: Bowel sounds are normal. There is no distension.     Palpations: Abdomen is soft.     Tenderness: There is no abdominal  tenderness.  Genitourinary:    Comments: Bimanual exam- no adnexal masses ovaries nonpalpable, dryness noted, tenderness present   Cervix parous and pink- No discharge, atrophic tissue  Musculoskeletal:        General: Tenderness present. Normal range of motion.     Cervical back: Normal range of motion and neck supple.     Comments: Mild Pain in cervical with right rotation  Skin:    General:  Skin is warm and dry.  Neurological:     Mental Status: She is alert and oriented to person, place, and time.     Cranial Nerves: No cranial nerve deficit.     Deep Tendon Reflexes: Reflexes are normal and symmetric.  Psychiatric:        Behavior: Behavior normal.        Thought Content: Thought content normal.        Judgment: Judgment normal.      BP 125/85   Pulse (!) 109   Temp 98.1 F (36.7 C) (Temporal)   Wt 183 lb 6.4 oz (83.2 kg)   SpO2 97%   BMI 31.48 kg/m       Assessment & Plan:  EARTHA VONBEHREN comes in today with chief complaint of Annual Exam   Diagnosis and orders addressed:  1. Hyperlipidemia, unspecified hyperlipidemia type - atorvastatin  (LIPITOR) 20 MG tablet; Take 1 tablet (20 mg total) by mouth daily.  Dispense: 90 tablet; Refill: 2  2. Current moderate episode of major depressive disorder, unspecified whether recurrent (HCC) - busPIRone  (BUSPAR ) 30 MG tablet; Take 1 tablet (30 mg total) by mouth 2 (two) times daily.  Dispense: 180 tablet; Refill: 1 - DULoxetine  (CYMBALTA ) 60 MG capsule; Take 1 capsule (60 mg total) by mouth daily.  Dispense: 90 capsule; Refill: 1  3. GAD (generalized anxiety disorder) - busPIRone  (BUSPAR ) 30 MG tablet; Take 1 tablet (30 mg total) by mouth 2 (two) times daily.  Dispense: 180 tablet; Refill: 1 - DULoxetine  (CYMBALTA ) 60 MG capsule; Take 1 capsule (60 mg total) by mouth daily.  Dispense: 90 capsule; Refill: 1  4. Gynecologic exam normal (Primary) - Cytology - PAP([facility])  5. Obesity (BMI 30-39.9) Encourage healthy diet and exercise   6. S/P cervical spinal fusion  7. Encounter for surveillance of contraceptive pills Discussed stopping these, hormone panel pending - Hormone Panel  8. Gastroesophageal reflux disease without esophagitis -Diet discussed- Avoid fried, spicy, citrus foods, caffeine and alcohol -Do not eat 2-3 hours before bedtime -Encouraged small frequent meals  9. 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 Sjogren's syndrome, with unspecified organ involvement  11. Vaginitis, atrophic Start Estrace  three times a week  - estradiol  (ESTRACE ) 0.01 % CREA vaginal cream; Place 1 Applicatorful vaginally 3 (three) times a week.  Dispense: 42.5 g; Refill: 12 - Hormone Panel   Labs pending Continue current medications  Health Maintenance reviewed Diet and exercise encouraged  Follow up plan: 6 months   Bari Learn, FNP

## 2024-05-29 NOTE — Patient Instructions (Signed)
 Vaginal Dryness and Thinning (Atrophic Vaginitis): What to Know  Atrophic vaginitis is a condition where the lining of the vagina becomes thin, dry, and inflamed. This condition can make you more likely to: Get an infection. Have pain during sex. Have other uncomfortable symptoms. Tell your health care provider if you notice any changes in your vagina. They can help you find ways to feel better. They can also treat infections and suggest healthy habits for a healthy vagina. What are the causes? Atrophic vaginitis is caused by a drop in estrogen. It's more common when menstrual periods stop during menopause. This often starts between ages 65-55 and may get worse over time as estrogen levels get lower. Estrogen helps to: Keep the vagina moist. Lubricate the vagina during sex. Protect against infection. A drop in estrogen can lead to: Thinner and drier vaginal lining. A smaller, less stretchy vagina. What increases the risk? Many factors make you more likely to have vaginal dryness and thinning. These include: Taking medicines that block estrogen. Having your ovaries taken out. Cancer treatment, such as: Radiation. Chemotherapy. Having recently delivered a baby, especially if not a vaginal delivery. Breastfeeding. Being over age 20. Having an eating disorder. Smoking. What are the signs or symptoms? Pain, soreness, or bleeding during sex. Burning, irritation, or itching around your vagina. Loss of interest in sex. Burning pain while peeing or peeing often. Vaginal discharge. It may be: White. Alejandra Mcmahon. Yellow. Blood-tinged. Thick. Watery. Sometimes, there are no symptoms. How is this diagnosed? This condition is diagnosed based on: Your medical history. A pelvic exam to check the tissue in the vagina. Rarely, you may have more tests. These include: A pee test to check for a urinary tract infection. An acid balance check in the vagina. How is this treated? Treatment depends  on your symptoms. Treatment may include: Lubricants for the vagina. Long-acting moisturizers. Low-dose estrogen. These come in: Creams. Tablets. Vaginal rings. Treatment may not be needed if your symptoms are mild and you're not having sex. Talk to your provider about any history of breast cancer, endometrial cancer, or blood clots. Follow these instructions at home: Medicines Take your medicines only as told. Do not use herbal or other medicines unless your provider says it's safe. Use creams, lubricants, or moisturizers only as told. General instructions If your dryness and thinning is related to menopause, talk about your symptoms and treatment options with your provider. Do not douche. Do not use products that make your vagina dry. These include: Scented sprays. Tampons. Soaps. Use water-soluble lubricant or moisturizer if sex is painful. Having sex more often can improve blood flow and make the vaginal tissue more stretchy. Contact a health care provider if: Your discharge from your vagina changes in color and has a smell. You have new symptoms. Your symptoms don't get better with treatment. Your symptoms get worse. This information is not intended to replace advice given to you by your health care provider. Make sure you discuss any questions you have with your health care provider. Document Revised: 02/18/2023 Document Reviewed: 02/18/2023 Elsevier Patient Education  2024 [employer].

## 2024-06-01 ENCOUNTER — Ambulatory Visit: Payer: Self-pay | Admitting: Family

## 2024-06-01 LAB — CYTOLOGY - PAP
Adequacy: ABSENT
Diagnosis: NEGATIVE

## 2024-06-03 ENCOUNTER — Other Ambulatory Visit: Payer: Self-pay | Admitting: *Deleted

## 2024-06-03 DIAGNOSIS — F411 Generalized anxiety disorder: Secondary | ICD-10-CM

## 2024-06-03 DIAGNOSIS — F321 Major depressive disorder, single episode, moderate: Secondary | ICD-10-CM

## 2024-06-05 ENCOUNTER — Ambulatory Visit
Admission: EM | Admit: 2024-06-05 | Discharge: 2024-06-05 | Disposition: A | Discharge status: Home / Self Care | Attending: Family Medicine | Admitting: Family Medicine

## 2024-06-05 DIAGNOSIS — L089 Local infection of the skin and subcutaneous tissue, unspecified: Secondary | ICD-10-CM

## 2024-06-05 DIAGNOSIS — L723 Sebaceous cyst: Secondary | ICD-10-CM

## 2024-06-05 MED ORDER — DOXYCYCLINE HYCLATE 100 MG PO CAPS: 100.0000 mg | ORAL_CAPSULE | Freq: Two times a day (BID) | ORAL | 0 refills | Status: DC

## 2024-06-05 NOTE — ED Provider Notes (Signed)
 [facility]    CSN: [id number] Arrival date & time: 06/05/24  1344      History   Chief Complaint Chief Complaint  Patient presents with   Abscess    HPI Alejandra Mcmahon is a 48 y.o. female.   Patient presenting today with a painful red inflamed cyst to the left mid back that she first started noticing worsening yesterday.  She states she has had this area on her back for many years, last flareup similar to this was in her 80s.  She denies known injury to the area but thinks a new bra rubbed the area and irritated it.  Denies fever, chills, bleeding, drainage, nausea, vomiting.  So far trying ibuprofen  with minimal relief.    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   Chronic bilateral low back pain without sciatica 10/15/2022   Hyperlipidemia 09/25/2021   Obesity (BMI 30-39.9) 05/27/2018   Encounter for surveillance of contraceptive pills 02/18/2017   GAD (generalized anxiety disorder) 05/09/2015   GERD (gastroesophageal reflux disease) 03/16/2013   Depression 03/16/2013   Allergic rhinitis 03/16/2013    Past Surgical History:  Procedure Laterality Date   ANTERIOR CERVICAL DECOMP/DISCECTOMY FUSION N/A 04/08/2023   Procedure: C3-4, C4-5 ANTERIOR CERVICAL DISCECTOMY FUSION, ALLOGRAFT, PLATE;  Surgeon: Barbarann Oneil BROCKS, MD;  Location: [facility] OR;  Service: Orthopedics;  Laterality: N/A;   CESAREAN SECTION     x2    OB History   No obstetric history on file.      Home Medications    Prior to Admission medications   Medication Sig Start Date End Date Taking? Authorizing  Provider  doxycycline  (VIBRAMYCIN ) 100 MG capsule Take 1 capsule (100 mg total) by mouth 2 (two) times daily. 06/05/24  Yes Stuart Vernell Norris, PA-C  Ascorbic Acid (VITAMIN C  PO) Take 3 tablets by mouth daily.    [provider]  atorvastatin  (LIPITOR) 20 MG tablet Take 1 tablet (20 mg total) by mouth daily. 05/29/24   Lavell Bari LABOR, FNP  busPIRone  (BUSPAR ) 30 MG tablet Take 1 tablet (30 mg total) by mouth 2 (two) times daily. 05/29/24   Lavell Bari A, FNP  CALCIUM  PO Take 2 tablets by mouth 3 (three) times a week.    [provider]  cyclobenzaprine  (FLEXERIL ) 10 MG tablet Take 1 tablet (10 mg total) by mouth 3 (three) times daily. 03/10/24   Lavell Bari A, FNP  DULoxetine  (CYMBALTA ) 60 MG capsule TAKE 1 CAPSULE(60 MG) BY MOUTH DAILY 06/03/24   Lavell Bari A, FNP  estradiol  (ESTRACE ) 0.01 % CREA vaginal cream Place 1 Applicatorful vaginally 3 (three) times a week. 05/29/24   Lavell Bari LABOR, FNP  Multiple Vitamin (MULTIVITAMIN WITH MINERALS) TABS tablet Take 2 tablets by mouth daily.    [provider]  norgestimate -ethinyl estradiol  (MILI) 0.25-35 MG-MCG tablet TAKE 1 TABLET BY MOUTH EVERY DAY 10/22/23   Lavell Bari A, FNP  pilocarpine  (SALAGEN ) 5 MG tablet Take 1 tablet (5 mg total) by mouth 3 (three) times daily as needed. 11/06/23   Cheryl Waddell HERO, PA-C  Family History Family History  Problem Relation Age of Onset   Cancer Mother        breast   Breast cancer Mother    Healthy Son    Healthy Daughter    Scoliosis Daughter     Social History Social History   Tobacco Use   Smoking status: Never    Passive exposure: Current   Smokeless tobacco: Never  Vaping Use   Vaping status: Never Used  Substance Use Topics   Alcohol use: No   Drug use: No     Allergies   Wheat, Celexa [citalopram hydrobromide], Diclofenac , Tape, Wellbutrin [bupropion], and Amoxicillin   Review of Systems Review of Systems Per HPI  Physical Exam Triage  Vital Signs ED Triage Vitals  Encounter Vitals Group     BP 06/05/24 1404 123/83     Girls Systolic BP Percentile --      Girls Diastolic BP Percentile --      Boys Systolic BP Percentile --      Boys Diastolic BP Percentile --      Pulse Rate 06/05/24 1404 (!) 106     Resp 06/05/24 1404 16     Temp 06/05/24 1404 98.2 F (36.8 C)     Temp Source 06/05/24 1404 Oral     SpO2 06/05/24 1404 96 %     Weight --      Height --      Head Circumference --      Peak Flow --      Pain Score 06/05/24 1403 6     Pain Loc --      Pain Education --      Exclude from Growth Chart --    No data found.  Updated Vital Signs BP 123/83 (BP Location: Right Arm)   Pulse (!) 106   Temp 98.2 F (36.8 C) (Oral)   Resp 16   LMP  (LMP Unknown)   SpO2 96%   Visual Acuity Right Eye Distance:   Left Eye Distance:   Bilateral Distance:    Right Eye Near:   Left Eye Near:    Bilateral Near:     Physical Exam Vitals and nursing note reviewed.  Constitutional:      Appearance: Normal appearance. She is not ill-appearing.  HENT:     Head: Atraumatic.  Eyes:     Extraocular Movements: Extraocular movements intact.     Conjunctiva/sclera: Conjunctivae normal.  Cardiovascular:     Rate and Rhythm: Normal rate.  Pulmonary:     Effort: Pulmonary effort is normal.  Musculoskeletal:        General: Normal range of motion.     Cervical back: Normal range of motion and neck supple.  Skin:    General: Skin is warm and dry.     Findings: Erythema present.     Comments: 2 cm soft demarcated sebaceous cyst to the left lateral mid back, tender to palpation, erythematous, warm.  Nonfluctuant, not indurated, no active bleeding or drainage  Neurological:     Mental Status: She is alert and oriented to person, place, and time.  Psychiatric:        Mood and Affect: Mood normal.        Thought Content: Thought content normal.        Judgment: Judgment normal.      UC Treatments / Results   Labs (all labs ordered are listed, but only abnormal results are displayed) Labs Reviewed - No data to  display  EKG   Radiology No results found.  Procedures Procedures (including critical care time)  Medications Ordered in UC Medications - No data to display  Initial Impression / Assessment and Plan / UC Course  I have reviewed the triage vital signs and the nursing notes.  Pertinent labs & imaging results that were available during my care of the patient were reviewed by me and considered in my medical decision making (see chart for details).     Infected sebaceous cyst, will treat with doxycycline , warm compresses, over-the-counter pain relievers and follow-up with PCP for excision procedure if desired.  Final Clinical Impressions(s) / UC Diagnoses   Final diagnoses:  Infected sebaceous cyst of skin   Discharge Instructions   None    ED Prescriptions     Medication Sig Dispense Auth. Provider   doxycycline  (VIBRAMYCIN ) 100 MG capsule Take 1 capsule (100 mg total) by mouth 2 (two) times daily. 14 capsule Stuart Vernell Norris, [place]      PDMP not reviewed this encounter.   Stuart Vernell Norris, [place] 06/05/24 1511

## 2024-06-05 NOTE — ED Triage Notes (Signed)
 Pt states cyst on her mid back since yesterday.

## 2024-06-15 ENCOUNTER — Ambulatory Visit: Attending: Rheumatology | Admitting: Rheumatology

## 2024-06-15 ENCOUNTER — Ambulatory Visit

## 2024-06-15 DIAGNOSIS — M79641 Pain in right hand: Secondary | ICD-10-CM

## 2024-06-15 DIAGNOSIS — M79642 Pain in left hand: Secondary | ICD-10-CM

## 2024-06-15 DIAGNOSIS — R7689 Other specified abnormal immunological findings in serum: Secondary | ICD-10-CM

## 2024-06-15 LAB — HORMONE PANEL (T4,TSH,FSH,TESTT,SHBG,DHEA,ETC)
DHEA-Sulfate, LCMS: 97 ug/dL
Estradiol, Serum, MS: 2.8 pg/mL
Estrone Sulfate: 17 ng/dL
Follicle Stimulating Hormone: 33 m[IU]/mL
Free T-3: 3.2 pg/mL
Free Testosterone, Serum: 1 pg/mL — ABNORMAL LOW
Progesterone, Serum: <10 ng/dL
Sex Hormone Binding Globulin: 151 nmol/L — ABNORMAL HIGH
T4: 9.2 ug/dL
TSH: 1.1 uU/mL
Testosterone, Serum (Total): 24 ng/dL
Testosterone-% Free: 0.4 %
Triiodothyronine (T-3), Serum: 139 ng/dL

## 2024-06-15 NOTE — Progress Notes (Signed)
 Visit diagnosis: Pain in both hands, positive ANA  Patient was here to get ultrasound examination of bilateral hands to look for synovitis or tenosynovitis.  A limited ultrasound examination of bilateral hands was performed per EULAR recommendations. Using 12 MHz transducer, grayscale and power Doppler bilateral second and third MCP joints both dorsal and volar aspects were evaluated to look for synovitis or tenosynovitis. The findings were there was no synovitis or tenosynovitis on ultrasound examination.   Impression: No synovitis or tenosynovitis was noted on the limited ultrasound examination of both hands.  The results were reviewed with the patient.  Patient voiced understanding.  Maya Nash, MD

## 2024-06-16 ENCOUNTER — Encounter: Payer: Self-pay | Admitting: Family

## 2024-06-16 ENCOUNTER — Ambulatory Visit: Admitting: Family

## 2024-06-16 VITALS — BP 133/86 | HR 106 | Temp 98.0°F | Ht 64.0 in | Wt 183.4 lb

## 2024-06-16 DIAGNOSIS — L0291 Cutaneous abscess, unspecified: Secondary | ICD-10-CM | POA: Diagnosis not present

## 2024-06-16 MED ORDER — SULFAMETHOXAZOLE-TRIMETHOPRIM 800-160 MG PO TABS: 1.0000 | ORAL_TABLET | Freq: Two times a day (BID) | ORAL | 0 refills | Status: DC

## 2024-06-16 NOTE — Patient Instructions (Signed)
 Skin Abscess  A skin abscess is an infected area on or under your skin. It contains pus and other material. An abscess may also be called a furuncle, carbuncle, or boil. It is often the result of an infection caused by bacteria. An abscess can occur in or on almost any part of your body. Sometimes, an abscess may break open (rupture) on its own. In most cases, it will keep getting worse unless it is treated. An abscess can cause pain and make you feel ill. An untreated abscess can cause infection to spread to other parts of your body or your bloodstream. The abscess may need to be drained. You may also need to take antibiotics. What are the causes? An abscess occurs when germs, like bacteria, pass through your skin and cause an infection. This may be caused by: A scrape or cut on your skin. A puncture wound through your skin, such as a needle injection or insect bite. Blocked oil or sweat glands. Blocked and infected hair follicles. A fluid-filled sac that forms beneath your skin (sebaceous cyst) and becomes infected. What increases the risk? You may be more likely to develop an abscess if: You have problems with blood circulation, or you have a weak body defense system (immune system). You have diabetes. You have dry and irritated skin. You get injections often or use IV drugs. You have a foreign body in a wound, such as a splinter. You smoke or use tobacco products. What are the signs or symptoms? Symptoms of this condition include: A painful, firm bump under the skin. A bump with pus at the top. This may break through the skin and drain. Other symptoms include: Redness and swelling around the abscess. Warmth or tenderness. Swelling of the lymph nodes (glands) near the abscess. A sore on the skin. How is this diagnosed? This condition may be diagnosed based on a physical exam and your medical history. You may also have tests done, such as: A test of a sample of pus. This may be done  to find what is causing the infection. Blood tests. Imaging tests, such as an ultrasound, CT scan, or MRI. How is this treated? A small abscess that drains on its own may not need to be treated. Treatment for larger abscesses may include: Moist heat or a heat pack applied to the area a few times a day. Incision and drainage. This is a procedure to drain the abscess. Antibiotics. For a severe abscess, you may first get antibiotics through an IV and then change to antibiotics by mouth. Follow these instructions at home: Medicines Take over-the-counter and prescription medicines only as told by your provider. If you were prescribed antibiotics, take them as told by your provider. Do not stop using the antibiotic even if you start to feel better. Abscess care  If you have an abscess that has not drained, apply heat to the affected area. Use the heat source that your provider recommends, such as a moist heat pack or a heating pad. Place a towel between your skin and the heat source. Leave the heat on for 20-30 minutes at a time. If your skin turns bright red, remove the heat right away to prevent burns. The risk of burns is higher if you cannot feel pain, heat, or cold. Follow instructions from your provider about how to take care of your abscess. Make sure you: Cover the abscess with a bandage (dressing). Wash your hands with soap and water for at least 20 seconds before  and after you change the dressing or gauze. If soap and water are not available, use hand sanitizer. Change your dressing or gauze as told by your provider. Check your abscess every day for signs of an infection that is getting worse. Check for: More redness, swelling, pain, or tenderness. More fluid or blood. Warmth. More pus or a worse smell. General instructions To avoid spreading the infection: Do not share personal care items, towels, or hot tubs with others. Avoid making skin contact with other people. Be careful  when getting rid of used dressings, wound packing, or any drainage from the abscess. Do not use any products that contain nicotine or tobacco. These products include cigarettes, chewing tobacco, and vaping devices, such as e-cigarettes. If you need help quitting, ask your provider. Do not use any creams, ointments, or liquids unless you have been told to by your provider. Contact a health care provider if: You see redness that spreads quickly or red streaks on your skin spreading away from the abscess. You have any signs of worse infection at the abscess. You vomit every time you eat or drink. You have a fever, chills, or muscle aches. The cyst or abscess returns. Get help right away if: You have severe pain. You make less pee (urine) than normal. This information is not intended to replace advice given to you by your health care provider. Make sure you discuss any questions you have with your health care provider. Document Revised: 02/21/2022 Document Reviewed: 02/21/2022 Elsevier Patient Education  2024 [employer].

## 2024-06-16 NOTE — Progress Notes (Signed)
 Subjective:    Patient ID: Alejandra Mcmahon, female    DOB: 1976-02-24, 48 y.o.   MRN: [id number]  Chief Complaint  Patient presents with   Cyst    HPI PT presents to the office today with an abscess on left upper back that she noticed two weeks. Reports it has worsen. Reports aching pain of 5 out 10, but if she hits it can be 9 out 10. Denies any discharge or fevers.    Review of Systems  All other systems reviewed and are negative.   Social History   Socioeconomic History   Marital status: Married    Spouse name: Not on file   Number of children: Not on file   Years of education: Not on file   Highest education level: Bachelor's degree (e.g., BA, AB, BS)  Occupational History   Not on file  Tobacco Use   Smoking status: Never    Passive exposure: Current   Smokeless tobacco: Never  Vaping Use   Vaping status: Never Used  Substance and Sexual Activity   Alcohol use: No   Drug use: No   Sexual activity: Not on file  Other Topics Concern   Not on file  Social History Narrative   Not on file   Social Drivers of Health   Financial Resource Strain: Medium Risk (05/28/2024)   Overall Financial Resource Strain (CARDIA)    Difficulty of Paying Living Expenses: Somewhat hard  Food Insecurity: No Food Insecurity (05/28/2024)   Hunger Vital Sign    Worried About Running Out of Food in the Last Year: Never true    Ran Out of Food in the Last Year: Never true  Transportation Needs: No Transportation Needs (05/28/2024)   PRAPARE - Administrator, Civil Service (Medical): No    Lack of Transportation (Non-Medical): No  Physical Activity: Insufficiently Active (05/28/2024)   Exercise Vital Sign    Days of Exercise per Week: 5 days    Minutes of Exercise per Session: 20 min  Stress: Stress Concern Present (05/28/2024)   [employer] of Occupational Health - Occupational Stress Questionnaire    Feeling of Stress: Rather much  Social Connections: Socially  Integrated (05/28/2024)   Social Connection and Isolation Panel    Frequency of Communication with Friends and Family: More than three times a week    Frequency of Social Gatherings with Friends and Family: Twice a week    Attends Religious Services: More than 4 times per year    Active Member of [employer] or Organizations: Yes    Attends Engineer, Structural: More than 4 times per year    Marital Status: Married   Family History  Problem Relation Age of Onset   Cancer Mother        breast   Breast cancer Mother    Healthy Son    Healthy Daughter    Scoliosis Daughter         Objective:   Physical Exam Vitals reviewed.  Constitutional:      General: She is not in acute distress.    Appearance: She is well-developed.  HENT:     Head: Normocephalic and atraumatic.  Eyes:     Pupils: Pupils are equal, round, and reactive to light.  Neck:     Thyroid : No thyromegaly.  Cardiovascular:     Rate and Rhythm: Normal rate and regular rhythm.     Heart sounds: Normal heart sounds. No murmur heard. Pulmonary:  Effort: Pulmonary effort is normal. No respiratory distress.     Breath sounds: Normal breath sounds. No wheezing.  Abdominal:     General: Bowel sounds are normal. There is no distension.     Palpations: Abdomen is soft.     Tenderness: There is no abdominal tenderness.  Musculoskeletal:        General: No tenderness. Normal range of motion.     Cervical back: Normal range of motion and neck supple.  Skin:    General: Skin is warm and dry.         Comments: Hard erythemas abscess approx 4X4  Neurological:     Mental Status: She is alert and oriented to person, place, and time.     Cranial Nerves: No cranial nerve deficit.     Deep Tendon Reflexes: Reflexes are normal and symmetric.  Psychiatric:        Behavior: Behavior normal.        Thought Content: Thought content normal.        Judgment: Judgment normal.     Verbal consent given by patient. Local  anesthesia Lidocaine  1% with 5ml Betadine prep Small incision made Scant amount of purulent discharge and moderate sanious discharge Cleaned with Saline Dressing applied   BP 133/86   Pulse (!) 106   Temp 98 F (36.7 C) (Temporal)   Ht 5' 4 (1.626 m)   Wt 183 lb 6.4 oz (83.2 kg)   LMP  (LMP Unknown)   BMI 31.48 kg/m      Assessment & Plan:  Alejandra Mcmahon comes in today with chief complaint of Cyst   Diagnosis and orders addressed:  1. Abscess (Primary) Start bactrim   Pressure dressing applied, keep on for 24 hours Continue to let drain  Report any fevers or increase pain - sulfamethoxazole -trimethoprim  (BACTRIM  DS) 800-160 MG tablet; Take 1 tablet by mouth 2 (two) times daily.  Dispense: 14 tablet; Refill: 0     Bari Learn, FNP

## 2024-06-26 ENCOUNTER — Ambulatory Visit: Admitting: Nurse Practitioner

## 2024-06-26 ENCOUNTER — Ambulatory Visit: Payer: Self-pay

## 2024-06-26 ENCOUNTER — Encounter: Payer: Self-pay | Admitting: Nurse Practitioner

## 2024-06-26 VITALS — BP 119/79 | HR 145 | Temp 98.4°F | Ht 64.0 in | Wt 180.0 lb

## 2024-06-26 DIAGNOSIS — L270 Generalized skin eruption due to drugs and medicaments taken internally: Secondary | ICD-10-CM

## 2024-06-26 MED ORDER — PREDNISONE 20 MG PO TABS: ORAL_TABLET | ORAL | 0 refills | Status: DC

## 2024-06-26 MED ORDER — METHYLPREDNISOLONE ACETATE 80 MG/ML IJ SUSP
80.0000 mg | Freq: Once | INTRAMUSCULAR | Status: AC
  Administered 2024-06-26: 80 mg via INTRAMUSCULAR

## 2024-06-26 NOTE — Patient Instructions (Signed)
 Hives Hives (urticaria) are itchy, red, swollen areas of skin. They can show up on any part of the body. They often fade within 24 hours (acute hives). If you get new hives after the old ones fade and the cycle goes on for many days or weeks, it is called chronic hives. Hives do not spread from person to person (are not contagious). Hives can happen when your body reacts to something you are allergic to (allergen) or to something that irritates your skin. When you are exposed to something that triggers hives, your body releases a chemical called histamine. This causes redness, itching, and swelling. Hives can show up right after you are exposed to a trigger or hours later. What are the causes? Hives may be caused by: Food allergies. Insect bites or stings. Allergies to pollen or pets. Spending time in sunlight, heat, or cold (exposure). Exercise. Stress. You can also get hives from other conditions and treatments. These include: Viruses, such as the common cold. Bacterial infections, such as urinary tract infections and strep throat. Certain medicines. Contact with latex or chemicals. Allergy shots. Blood transfusions. In some cases, the cause of hives is not known (idiopathic hives). What increases the risk? You are more likely to get hives if: You are female. You have food allergies. Hives are more common if you are allergic to citrus fruits, milk, eggs, peanuts, tree nuts, or shellfish. You are allergic to: Medicines. Latex. Insects. Animals. Pollen. What are the signs or symptoms? Common symptoms of hives include raised, itchy, red or white bumps or patches on your skin. These areas may: Become large and swollen (welts). Quickly change shape and location. This may happen more than once. Be separate hives or connect over a large area of skin. Sting or become painful. Turn white when pressed in the center (blanch). In severe cases, your hands, feet, and face may also become  swollen. This may happen if hives form deeper in your skin. How is this diagnosed? Hives may be diagnosed based on your symptoms, medical history, and a physical exam. You may have skin, pee (urine), or blood tests done. These can help find out what is causing your hives and rule out other health issues. You may also have a biopsy done. This is when a small piece of skin is removed for testing. How is this treated? Treatment for hives depends on the cause and on how severe your symptoms are. You may be told to use cool, wet cloths (cool compresses) or to take cool showers to relieve itching. Treatment may also include: Medicines to help: Relieve itching (antihistamines). Reduce swelling (corticosteroids). Treat infection (antibiotics). An injectable medicine called omalizumab. You may need this if you have chronic idiopathic hives and still have symptoms even after you are treated with antihistamines. In severe cases, you may need to use a device filled with medicine that gives an emergency shot of epinephrine (auto-injector pen) to prevent a very bad allergic reaction (anaphylactic reaction). Follow these instructions at home: Medicines Take and apply over-the-counter and prescription medicines only as told by your health care provider. If you were prescribed antibiotics, take them as told by your provider. Do not stop using the antibiotic even if you start to feel better. Skin care Apply cool compresses to the affected areas. Do not scratch or rub your skin. General instructions Do not take hot showers or baths. This can make itching worse. Do not wear tight-fitting clothing. Use sunscreen. Wear protective clothing when you are outside. Avoid  anything that causes your hives. Keep a journal to help track what causes your hives. Write down: What medicines you take. What you eat and drink. What products you use on your skin. Keep all follow-up visits. Your provider will track how well  treatment is working. Contact a health care provider if: Your symptoms do not get better with medicine. Your joints are painful or swollen. You have a fever. You have pain in your abdomen. Get help right away if: Your tongue, lips, or eyelids swell. Your chest or throat feels tight. You have trouble breathing or swallowing. These symptoms may be an emergency. Use the auto-injector pen right away. Then call 911. Do not wait to see if the symptoms will go away. Do not drive yourself to the hospital. This information is not intended to replace advice given to you by your health care provider. Make sure you discuss any questions you have with your health care provider. Document Revised: 04/05/2022 Document Reviewed: 03/27/2022 Elsevier Patient Education  2024 [employer].

## 2024-06-26 NOTE — Progress Notes (Signed)
   Subjective:    Patient ID: Alejandra Mcmahon, female    DOB: [birth year], 48 y.o.   MRN: [id number]   Chief Complaint: RAsh all over body (Just finished Bactrim )   Rash This is a new problem. The current episode started yesterday. The problem has been waxing and waning since onset. The rash is diffuse. The rash is characterized by redness. Associated with: jjust finished a dose of bactrim . Pertinent negatives include no eye pain or shortness of breath. Past treatments include antihistamine. The treatment provided mild relief.    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  Eyes:  Negative for pain.  Respiratory:  Negative for shortness of breath.   Cardiovascular:  Negative for chest pain, palpitations and leg swelling.  Gastrointestinal:  Negative for abdominal pain.  Endocrine: Negative for polydipsia.  Skin:  Positive for rash.  Neurological:  Negative for dizziness, weakness and headaches.  Hematological:  Does not bruise/bleed easily.  All other systems reviewed and are negative.      Objective:   Physical Exam Constitutional:      Appearance: Normal appearance.  Cardiovascular:     Rate and Rhythm: Normal rate and regular rhythm.     Heart sounds: Normal heart sounds.  Pulmonary:     Breath sounds: Normal breath  sounds.  Skin:    General: Skin is warm.     Comments: Diffuse erythematous rash all over body.   Neurological:     General: No focal deficit present.     Mental Status: She is alert and oriented to person, place, and time.  Psychiatric:        Mood and Affect: Mood normal.    BP 119/79   Pulse (!) 145   Temp 98.4 F (36.9 C) (Temporal)   Ht 5' 4 (1.626 m)   Wt 180 lb (81.6 kg)   LMP  (LMP Unknown)   SpO2 96%   BMI 30.90 kg/m         Assessment & Plan:  Alejandra Mcmahon in today with chief complaint of RAsh all over body (Just finished Bactrim )   1. Allergic drug rash (Primary) Benadryl  OTC Cool compresses Avoid scratching RT prn - methylPREDNISolone  acetate (DEPO-MEDROL ) injection 80 mg - predniSONE  (DELTASONE ) 20 MG tablet; 2 po at sametime daily for 5 days-  Dispense: 10 tablet; Refill: 0    The above assessment and management plan was discussed with the patient. The patient verbalized understanding of and has agreed to the management plan. Patient is aware to call the clinic if symptoms persist or worsen. Patient is aware when to return to the clinic for a follow-up visit. Patient educated on when it is appropriate to go to the emergency department.   Mary-Margaret Gladis, FNP

## 2024-06-26 NOTE — Telephone Encounter (Signed)
 FYI Only or Action Required?: FYI only for provider: appointment scheduled on 06/26/2024 at 10:30am with Ronal Dates FNP at PCP office.  Patient was last seen in primary care on 06/16/2024 by Lavell Bari LABOR, FNP.  Called Nurse Triage reporting Rash.  Symptoms began yesterday.  Interventions attempted: OTC medications: Benadryl .  Symptoms are: gradually worsening.  Triage Disposition: See PCP When Office is Open (Within 3 Days)  Patient/caregiver understands and will follow disposition?: Yes                 Copied from CRM #[id number]. Topic: Clinical - Red Word Triage >> Jun 26, 2024  8:49 AM Willma R wrote: [facility] that prompted transfer to Nurse Triage: Patient felling well this week, has had a fever of 99, headache, and body aches. Then yesterday broke out in hives that go from her face to her calves. Overnight they have gotten worse, they are hot and painful. Reason for Disposition  Rash started within 3 days after antibiotic stopped  Answer Assessment - Initial Assessment Questions A few days ago, started with fever/sneezing/cough Yesterday patient started breaking out in hives (red rash like area not raised or red dots per patient) Face to knees hives--hot to touch and itching--splotchy and red hives--skin doesn't feel raised  Taking Dayquil and Nyquil for cold-like symptoms Tylenol  for fever 99.1 F temperature now oral Benadryl --taken yesterday twice and this morning--didn't help the itching or the rash  Patient denies difficulty breathing, chest pain, swelling of face or lip or tongue Patient states that she took penicillin in the past and after taking it multiple times she had an allergic reaction to it and cannot take that anymore Patient did add that she started a new antibiotic she had never had--finished it 2-3 days ago---sulfamethoxazole -trimethoprim  (BACTRIM  DS) 800-160 MG tablet  Patient still denies any difficulty breathing and is aware if  anything worsens to go to the Emergency Room  Patient is advised to call us  back if anything changes or with any further questions/concerns. Patient is advised that if anything worsens to go to the Emergency Room. Patient verbalized understanding.  Protocols used: Rash or Redness - Widespread-A-AH, Rash - Widespread On Drugs-A-AH

## 2024-06-26 NOTE — Telephone Encounter (Signed)
 Apt scheduled.
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
## 2024-07-22 ENCOUNTER — Other Ambulatory Visit: Payer: Self-pay | Admitting: Family

## 2024-07-22 DIAGNOSIS — F411 Generalized anxiety disorder: Secondary | ICD-10-CM

## 2024-07-22 DIAGNOSIS — F321 Major depressive disorder, single episode, moderate: Secondary | ICD-10-CM

## 2024-07-28 ENCOUNTER — Encounter: Payer: Self-pay | Admitting: Rheumatology

## 2024-07-28 ENCOUNTER — Ambulatory Visit: Attending: Rheumatology | Admitting: Rheumatology

## 2024-07-28 VITALS — BP 128/85 | HR 108 | Temp 98.5°F | Resp 17 | Ht 64.0 in | Wt 182.0 lb

## 2024-07-28 DIAGNOSIS — M79671 Pain in right foot: Secondary | ICD-10-CM

## 2024-07-28 DIAGNOSIS — K219 Gastro-esophageal reflux disease without esophagitis: Secondary | ICD-10-CM

## 2024-07-28 DIAGNOSIS — M3500 Sicca syndrome, unspecified: Secondary | ICD-10-CM | POA: Diagnosis not present

## 2024-07-28 DIAGNOSIS — M25562 Pain in left knee: Secondary | ICD-10-CM

## 2024-07-28 DIAGNOSIS — M791 Myalgia, unspecified site: Secondary | ICD-10-CM | POA: Diagnosis not present

## 2024-07-28 DIAGNOSIS — F32A Depression, unspecified: Secondary | ICD-10-CM

## 2024-07-28 DIAGNOSIS — M25561 Pain in right knee: Secondary | ICD-10-CM | POA: Diagnosis not present

## 2024-07-28 DIAGNOSIS — R5383 Other fatigue: Secondary | ICD-10-CM | POA: Diagnosis not present

## 2024-07-28 DIAGNOSIS — Z981 Arthrodesis status: Secondary | ICD-10-CM

## 2024-07-28 DIAGNOSIS — K9041 Non-celiac gluten sensitivity: Secondary | ICD-10-CM

## 2024-07-28 DIAGNOSIS — E782 Mixed hyperlipidemia: Secondary | ICD-10-CM | POA: Diagnosis not present

## 2024-07-28 DIAGNOSIS — M79642 Pain in left hand: Secondary | ICD-10-CM

## 2024-07-28 DIAGNOSIS — M79672 Pain in left foot: Secondary | ICD-10-CM

## 2024-07-28 DIAGNOSIS — J3089 Other allergic rhinitis: Secondary | ICD-10-CM

## 2024-07-28 DIAGNOSIS — M79641 Pain in right hand: Secondary | ICD-10-CM | POA: Diagnosis not present

## 2024-07-28 DIAGNOSIS — M545 Low back pain, unspecified: Secondary | ICD-10-CM

## 2024-07-28 DIAGNOSIS — F419 Anxiety disorder, unspecified: Secondary | ICD-10-CM

## 2024-07-28 DIAGNOSIS — G8929 Other chronic pain: Secondary | ICD-10-CM

## 2024-07-28 NOTE — Patient Instructions (Addendum)
 Standing Labs We placed an order today for your standing lab work.   Please have your standing labs drawn in 07/2025  Please have your labs drawn 2 weeks prior to your appointment so that the provider can discuss your lab results at your appointment, if possible.  Please note that you may see your imaging and lab results in MyChart before we have reviewed them. We will contact you once all results are reviewed. Please allow our office up to 72 hours to thoroughly review all of the results before contacting the office for clarification of your results.  WALK-IN LAB HOURS  Monday through Thursday from 8:00 am - 4:30 pm and Friday from 8:00 am-12:00 pm.  Patients with office visits requiring labs will be seen before walk-in labs.  You may encounter longer than normal wait times. Please allow additional time. Wait times may be shorter on  Monday and Thursday afternoons.  We do not book appointments for walk-in labs. We appreciate your patience and understanding with our staff.   Labs are drawn by [facility]. Please bring your co-pay at the time of your lab draw.  You may receive a bill from [facility] for your lab work.  Please note if you are on Hydroxychloroquine and and an order has been placed for a Hydroxychloroquine level,  you will need to have it drawn 4 hours or more after your last dose.  If you wish to have your labs drawn at another location, please call the office 24 hours in advance so we can fax the orders.  The office is located at [address], [address], [place], [place] [address]   If you have any questions regarding directions or hours of operation,  please call [phone].   As a reminder, please drink plenty of water prior to coming for your lab work. Thanks!   Low Back Sprain or Strain Rehab Ask your health care provider which exercises are safe for you. Do exercises exactly as told by your health care provider and adjust them as directed. It is normal to feel mild  stretching, pulling, tightness, or discomfort as you do these exercises. Stop right away if you feel sudden pain or your pain gets worse. Do not begin these exercises until told by your health care provider. Stretching and range-of-motion exercises These exercises warm up your muscles and joints and improve the movement and flexibility of your back. These exercises also help to relieve pain, numbness, and tingling. Lumbar rotation  Lie on your back on a firm bed or the floor with your knees bent. Straighten your arms out to your sides so each arm forms a 90-degree angle (right angle) with a side of your body. Slowly move (rotate) both of your knees to one side of your body until you feel a stretch in your lower back (lumbar). Try not to let your shoulders lift off the floor. Hold this position for __________ seconds. Tense your abdominal muscles and slowly move your knees back to the starting position. Repeat this exercise on the other side of your body. Repeat __________ times. Complete this exercise __________ times a day. Single knee to chest  Lie on your back on a firm bed or the floor with both legs straight. Bend one of your knees. Use your hands to move your knee up toward your chest until you feel a gentle stretch in your lower back and buttock. Hold your leg in this position by holding on to the front of your knee. Keep your  other leg as straight as possible. Hold this position for __________ seconds. Slowly return to the starting position. Repeat with your other leg. Repeat __________ times. Complete this exercise __________ times a day. Prone extension on elbows  Lie on your abdomen on a firm bed or the floor (prone position). Prop yourself up on your elbows. Use your arms to help lift your chest up until you feel a gentle stretch in your abdomen and your lower back. This will place some of your body weight on your elbows. If this is uncomfortable, try stacking pillows under  your chest. Your hips should stay down, against the surface that you are lying on. Keep your hip and back muscles relaxed. Hold this position for __________ seconds. Slowly relax your upper body and return to the starting position. Repeat __________ times. Complete this exercise __________ times a day. Strengthening exercises These exercises build strength and endurance in your back. Endurance is the ability to use your muscles for a long time, even after they get tired. Pelvic tilt This exercise strengthens the muscles that lie deep in the abdomen. Lie on your back on a firm bed or the floor with your legs extended. Bend your knees so they are pointing toward the ceiling and your feet are flat on the floor. Tighten your lower abdominal muscles to press your lower back against the floor. This motion will tilt your pelvis so your tailbone points up toward the ceiling instead of pointing to your feet or the floor. To help with this exercise, you may place a small towel under your lower back and try to push your back into the towel. Hold this position for __________ seconds. Let your muscles relax completely before you repeat this exercise. Repeat __________ times. Complete this exercise __________ times a day. Alternating arm and leg raises  Get on your hands and knees on a firm surface. If you are on a hard floor, you may want to use padding, such as an exercise mat, to cushion your knees. Line up your arms and legs. Your hands should be directly below your shoulders, and your knees should be directly below your hips. Lift your left leg behind you. At the same time, raise your right arm and straighten it in front of you. Do not lift your leg higher than your hip. Do not lift your arm higher than your shoulder. Keep your abdominal and back muscles tight. Keep your hips facing the ground. Do not arch your back. Keep your balance carefully, and do not hold your breath. Hold this position for  __________ seconds. Slowly return to the starting position. Repeat with your right leg and your left arm. Repeat __________ times. Complete this exercise __________ times a day. Abdominal set with straight leg raise  Lie on your back on a firm bed or the floor. Bend one of your knees and keep your other leg straight. Tense your abdominal muscles and lift your straight leg up, 4-6 inches (10-15 cm) off the ground. Keep your abdominal muscles tight and hold this position for __________ seconds. Do not hold your breath. Do not arch your back. Keep it flat against the ground. Keep your abdominal muscles tense as you slowly lower your leg back to the starting position. Repeat with your other leg. Repeat __________ times. Complete this exercise __________ times a day. Single leg lower with bent knees Lie on your back on a firm bed or the floor. Tense your abdominal muscles and lift your feet off the  floor, one foot at a time, so your knees and hips are bent in 90-degree angles (right angles). Your knees should be over your hips and your lower legs should be parallel to the floor. Keeping your abdominal muscles tense and your knee bent, slowly lower one of your legs so your toe touches the ground. Lift your leg back up to return to the starting position. Do not hold your breath. Do not let your back arch. Keep your back flat against the ground. Repeat with your other leg. Repeat __________ times. Complete this exercise __________ times a day. Posture and body mechanics Good posture and healthy body mechanics can help to relieve stress in your body's tissues and joints. Body mechanics refers to the movements and positions of your body while you do your daily activities. Posture is part of body mechanics. Good posture means: Your spine is in its natural S-curve position (neutral). Your shoulders are pulled back slightly. Your head is not tipped forward (neutral). Follow these guidelines to  improve your posture and body mechanics in your everyday activities. Standing  When standing, keep your spine neutral and your feet about hip-width apart. Keep a slight bend in your knees. Your ears, shoulders, and hips should line up. When you do a task in which you stand in one place for a long time, place one foot up on a stable object that is 2-4 inches (5-10 cm) high, such as a footstool. This helps keep your spine neutral. Sitting  When sitting, keep your spine neutral and keep your feet flat on the floor. Use a footrest, if necessary, and keep your thighs parallel to the floor. Avoid rounding your shoulders, and avoid tilting your head forward. When working at a desk or a computer, keep your desk at a height where your hands are slightly lower than your elbows. Slide your chair under your desk so you are close enough to maintain good posture. When working at a computer, place your monitor at a height where you are looking straight ahead and you do not have to tilt your head forward or downward to look at the screen. Resting When lying down and resting, avoid positions that are most painful for you. If you have pain with activities such as sitting, bending, stooping, or squatting, lie in a position in which your body does not bend very much. For example, avoid curling up on your side with your arms and knees near your chest (fetal position). If you have pain with activities such as standing for a long time or reaching with your arms, lie with your spine in a neutral position and bend your knees slightly. Try the following positions: Lying on your side with a pillow between your knees. Lying on your back with a pillow under your knees. Lifting  When lifting objects, keep your feet at least shoulder-width apart and tighten your abdominal muscles. Bend your knees and hips and keep your spine neutral. It is important to lift using the strength of your legs, not your back. Do not lock your knees  straight out. Always ask for help to lift heavy or awkward objects. This information is not intended to replace advice given to you by your health care provider. Make sure you discuss any questions you have with your health care provider. Document Revised: 11/12/2022 Document Reviewed: 09/26/2020 [employer] Patient Education  2024 [employer]Thoracic Strain Rehab Ask your health care provider which exercises are safe for you. Do exercises exactly as told by your provider  and adjust them as directed. It is normal to feel mild stretching, pulling, tightness, or discomfort as you do these exercises. Stop right away if you feel sudden pain or your pain gets worse. Do not begin these exercises until told by your provider. Stretching and range-of-motion exercise This exercise warms up your muscles and joints and improves the movement and flexibility of your back and shoulders. This exercise also helps to relieve pain. Chest and spine stretch  Lie down on your back on a firm surface. Roll a towel or a small blanket so it is about 4 inches (10 cm) in diameter. Put the towel under the middle of your back so it is under your spine, but not under your shoulder blades. Put your hands behind your head and let your elbows fall to your sides. This will increase your stretch. Take a deep breath (inhale). Hold for __________ seconds. Relax after you breathe out (exhale). Repeat __________ times. Complete this exercise __________ times a day. Strengthening exercises These exercises build strength and endurance in your back and your shoulder blade muscles. Endurance is the ability to use your muscles for a long time, even after they get tired. Alternating arm and leg raises  Get on your hands and knees on a firm surface. If you are on a hard floor, you may want to use padding, such as an exercise mat, to cushion your knees. Line up your arms and legs. Your hands should be directly below your shoulders, and your  knees should be directly below your hips. Lift your left leg behind you. At the same time, raise your right arm and straighten it in front of you. Do not lift your leg higher than your hip. Do not lift your arm higher than your shoulder. Keep your abdominal and back muscles tight. Keep your hips facing the ground. Do not arch your back. Carefully stay balanced. Do not hold your breath. Hold for __________ seconds. Slowly return to the starting position and repeat with your right leg and your left arm. Repeat __________ times. Complete this exercise __________ times a day. Straight arm rows This exercise is also called the shoulder extension exercise. Stand with your feet shoulder width apart. Secure an exercise band to a stable object in front of you so the band is at or above shoulder height. Hold one end of the exercise band in each hand. Straighten your elbows and lift your hands up to shoulder height. Step back, away from the secured end of the exercise band, until the band stretches. Squeeze your shoulder blades together and pull your hands down to the sides of your thighs. Stop when your hands are straight down by your sides. This is shoulder extension. Do not let your hands go behind your body. Hold for __________ seconds. Slowly return to the starting position. Repeat __________ times. Complete this exercise __________ times a day. Rowing scapular retraction This is an exercise in which the shoulder blades (scapulae) are pulled toward each other (retraction). Sit in a stable chair without armrests, or stand up. Secure an exercise band to a stable object in front of you so the band is at shoulder height. Hold one end of the exercise band in each hand. Your palms should face toward each other. Bring your arms out straight in front of you. Step back, away from the secured end of the exercise band, until the band stretches. Pull the band backward. As you do this, bend your elbows and  squeeze your shoulder  blades together, but avoid letting the rest of your body move. Do not shrug your shoulders upward while you do this. Stop when your elbows are at your sides or slightly behind your body. Hold for __________ seconds. Slowly straighten your arms to return to the starting position. Repeat __________ times. Complete this exercise __________ times a day. Posture and body mechanics Good posture and healthy body mechanics can help to relieve stress in your body's tissues and joints. Body mechanics refers to the movements and positions of your body while you do your daily activities. Posture is part of body mechanics. Good posture means: Your spine is in its natural S-curve position (neutral). Your shoulders are pulled back slightly. Your head is not tipped forward. Follow these guidelines to improve your posture and body mechanics in your everyday activities. Standing  When standing, keep your spine neutral and your feet about hip width apart. Keep a slight bend in your knees. Your ears, shoulders, and hips should line up with each other. When you do a task in which you lean forward while standing in one place for a long time, place one foot up on a stable object that is 2-4 inches (5-10 cm) high, such as a footstool. This helps keep your spine neutral. Sitting  When sitting, keep your spine neutral and keep your feet flat on the floor. Use a footrest if needed. Keep your thighs parallel to the floor. Avoid rounding your shoulders, and avoid tilting your head forward. When working at a desk or a computer, keep your desk at a height where your hands are slightly lower than your elbows. Slide your chair under your desk so you are close enough to maintain good posture. When working at a computer, place your monitor at a height where you are looking straight ahead and you do not have to tilt your head forward or downward to look at the screen. Resting When lying down and resting,  avoid positions that are most painful for you. If you have pain with activities such as sitting, bending, stooping, or squatting (flexion-basedactivities), lie in a position in which your body does not bend very much. For example, avoid curling up on your side with your arms and knees near your chest (fetal position). If you have pain with activities such as standing for a long time or reaching with your arms (extension-basedactivities), lie with your spine in a neutral position and bend your knees slightly. Try the following positions: Lie on your side with a pillow between your knees. Lie on your back with a pillow under your knees.  Lifting  When lifting objects, keep your feet at least shoulder width apart and tighten your abdominal muscles. Bend your knees and hips and keep your spine neutral. It is important to lift using the strength of your legs, not your back. Do not lock your knees straight out. Always ask for help to lift heavy or awkward objects. This information is not intended to replace advice given to you by your health care provider. Make sure you discuss any questions you have with your health care provider. Document Revised: 12/21/2022 Document Reviewed: 02/26/2022 Elsevier Patient Education  2024 [employer].

## 2024-07-30 ENCOUNTER — Ambulatory Visit: Payer: Self-pay | Admitting: Rheumatology

## 2024-07-30 NOTE — Progress Notes (Signed)
 CBC and CMP normal, UA negative, complements normal, sed rate normal, SSA and SSB antibodies negative, immunoglobulins normal.  Labs do not indicate an active autoimmune disease.

## 2024-08-01 LAB — COMPREHENSIVE METABOLIC PANEL WITH GFR
AG Ratio: 1.8 (calc) (ref 1.0–2.5)
ALT: 26 U/L (ref 6–29)
AST: 17 U/L (ref 10–35)
Albumin: 4.1 g/dL (ref 3.6–5.1)
Alkaline phosphatase (APISO): 74 U/L (ref 31–125)
BUN: 20 mg/dL (ref 7–25)
CO2: 30 mmol/L (ref 20–32)
Calcium: 9.3 mg/dL (ref 8.6–10.2)
Chloride: 102 mmol/L (ref 98–110)
Creat: 0.69 mg/dL (ref 0.50–0.99)
Globulin: 2.3 g/dL (ref 1.9–3.7)
Glucose, Bld: 84 mg/dL (ref 65–99)
Potassium: 5.1 mmol/L (ref 3.5–5.3)
Sodium: 138 mmol/L (ref 135–146)
Total Bilirubin: 0.2 mg/dL (ref 0.2–1.2)
Total Protein: 6.4 g/dL (ref 6.1–8.1)
eGFR: 108 mL/min/1.73m2

## 2024-08-01 LAB — CBC WITH DIFFERENTIAL/PLATELET
Absolute Lymphocytes: 3039 {cells}/uL (ref 850–3900)
Absolute Monocytes: 659 {cells}/uL (ref 200–950)
Basophils Absolute: 93 {cells}/uL (ref 0–200)
Basophils Relative: 0.9 %
Eosinophils Absolute: 402 {cells}/uL (ref 15–500)
Eosinophils Relative: 3.9 %
HCT: 43.3 % (ref 35.9–46.0)
Hemoglobin: 14 g/dL (ref 11.7–15.5)
MCH: 29.9 pg (ref 27.0–33.0)
MCHC: 32.3 g/dL (ref 31.6–35.4)
MCV: 92.5 fL (ref 81.4–101.7)
MPV: 9.6 fL (ref 7.5–12.5)
Monocytes Relative: 6.4 %
Neutro Abs: 6108 {cells}/uL (ref 1500–7800)
Neutrophils Relative %: 59.3 %
Platelets: 362 Thousand/uL (ref 140–400)
RBC: 4.68 Million/uL (ref 3.80–5.10)
RDW: 11.8 % (ref 11.0–15.0)
Total Lymphocyte: 29.5 %
WBC: 10.3 Thousand/uL (ref 3.8–10.8)

## 2024-08-01 LAB — C3 AND C4
C3 Complement: 157 mg/dL (ref 83–193)
C4 Complement: 34 mg/dL (ref 15–57)

## 2024-08-01 LAB — ANTI-NUCLEAR AB-TITER (ANA TITER): ANA Titer 1: 1:40 {titer} — ABNORMAL HIGH

## 2024-08-01 LAB — SEDIMENTATION RATE: Sed Rate: 9 mm/h (ref 0–20)

## 2024-08-01 LAB — IGG, IGA, IGM
IgG (Immunoglobin G), Serum: 767 mg/dL (ref 600–1640)
IgM, Serum: 79 mg/dL (ref 50–300)
Immunoglobulin A: 186 mg/dL (ref 47–310)

## 2024-08-01 LAB — PROTEIN ELECTROPHORESIS, SERUM, WITH REFLEX
Albumin ELP: 4.1 g/dL (ref 3.8–4.8)
Alpha 1: 0.3 g/dL (ref 0.2–0.3)
Alpha 2: 0.7 g/dL (ref 0.5–0.9)
Beta 2: 0.4 g/dL (ref 0.2–0.5)
Beta Globulin: 0.5 g/dL (ref 0.4–0.6)
Gamma Globulin: 0.7 g/dL — ABNORMAL LOW (ref 0.8–1.7)
Total Protein: 6.7 g/dL (ref 6.1–8.1)

## 2024-08-01 LAB — URINALYSIS, ROUTINE W REFLEX MICROSCOPIC
Bilirubin Urine: NEGATIVE
Glucose, UA: NEGATIVE
Hgb urine dipstick: NEGATIVE
Ketones, ur: NEGATIVE
Leukocytes,Ua: NEGATIVE
Nitrite: NEGATIVE
Protein, ur: NEGATIVE
Specific Gravity, Urine: 1.008 (ref 1.001–1.035)
pH: 5.5 (ref 5.0–8.0)

## 2024-08-01 LAB — IFE INTERPRETATION

## 2024-08-01 LAB — ANA: Anti Nuclear Antibody (ANA): POSITIVE — AB

## 2024-08-01 LAB — SJOGRENS SYNDROME-A EXTRACTABLE NUCLEAR ANTIBODY: SSA (Ro) (ENA) Antibody, IgG: <1 AI

## 2024-08-01 LAB — SJOGRENS SYNDROME-B EXTRACTABLE NUCLEAR ANTIBODY: SSB (La) (ENA) Antibody, IgG: <1 AI

## 2024-08-02 NOTE — Progress Notes (Signed)
 CBC normal, CMP normal, UA negative, ANA low titer positive, complements normal, sed rate normal, SSA negative, SSB negative, immunoglobulins normal, IFE normal.  Labs are negative for autoimmune disease.

## 2024-08-06 ENCOUNTER — Other Ambulatory Visit: Payer: Self-pay | Admitting: Family

## 2024-08-06 ENCOUNTER — Encounter: Payer: Self-pay | Admitting: Family

## 2024-08-06 DIAGNOSIS — F321 Major depressive disorder, single episode, moderate: Secondary | ICD-10-CM

## 2024-08-06 DIAGNOSIS — F411 Generalized anxiety disorder: Secondary | ICD-10-CM

## 2024-08-06 NOTE — Telephone Encounter (Signed)
 [name] NTBS in May for 6 mos FU RFs sent to pharmacy

## 2024-08-06 NOTE — Telephone Encounter (Signed)
 "  Letter sent.   "

## 2024-08-28 ENCOUNTER — Encounter: Payer: Self-pay | Admitting: Rheumatology

## 2024-10-16 ENCOUNTER — Encounter

## 2024-10-16 DIAGNOSIS — Z1231 Encounter for screening mammogram for malignant neoplasm of breast: Secondary | ICD-10-CM

## 2025-07-28 ENCOUNTER — Ambulatory Visit: Admitting: Rheumatology
